# Patient Record
Sex: Male | Born: 1985
Health system: Southern US, Community
[De-identification: ages and names within clinical notes are randomized; demographics above are authoritative.]

## PROBLEM LIST (undated history)

## (undated) DIAGNOSIS — M1990 Unspecified osteoarthritis, unspecified site: Secondary | ICD-10-CM

## (undated) DIAGNOSIS — F419 Anxiety disorder, unspecified: Secondary | ICD-10-CM

## (undated) DIAGNOSIS — T7840XA Allergy, unspecified, initial encounter: Secondary | ICD-10-CM

## (undated) HISTORY — DX: Allergy, unspecified, initial encounter: T78.40XA

## (undated) HISTORY — DX: Unspecified osteoarthritis, unspecified site: M19.90

## (undated) HISTORY — PX: COLON SURGERY: SHX602

## (undated) HISTORY — DX: Anxiety disorder, unspecified: F41.9

## (undated) SURGICAL SUPPLY — 1 items: TISSUE ARTHROFLEX THICK 4 (Tissue) ×2 IMPLANT

---
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
## 2009-07-21 HISTORY — PX: CLAVICLE SURGERY: SHX598

## 2017-09-16 DIAGNOSIS — Z0000 Encounter for general adult medical examination without abnormal findings: Secondary | ICD-10-CM | POA: Diagnosis not present

## 2018-04-13 DIAGNOSIS — M79672 Pain in left foot: Secondary | ICD-10-CM | POA: Diagnosis not present

## 2018-04-13 DIAGNOSIS — M722 Plantar fascial fibromatosis: Secondary | ICD-10-CM | POA: Diagnosis not present

## 2019-03-25 ENCOUNTER — Ambulatory Visit: Payer: Self-pay

## 2019-03-25 NOTE — Telephone Encounter (Signed)
Pt called wanting to set appointment for new patient visit. He states that he has been experiencing SOB with activity for 2 weeks. He states that he runs and cycles regularly and has never experienced this before. He has recently moved to our area. He has never had asthma or allergies. He states that as he runs his breathing gets short and tight. "almost like asthma". He has no cough,sore throat,fever. He has worked from home and been careful to not socialize. He has a newborn. He denies SOB at rest. Only when active. Per protocol patient will go to UC for evaluation of his symptoms. Call was transferred to office for set up of appointment to establish care.  Reason for Disposition . [1] MODERATE longstanding difficulty breathing (e.g., speaks in phrases, SOB even at rest, pulse 100-120) AND [2] SAME as normal  Answer Assessment - Initial Assessment Questions 1. RESPIRATORY STATUS: "Describe your breathing?" (e.g., wheezing, shortness of breath, unable to speak, severe coughing)      SOB with activity 2. ONSET: "When did this breathing problem begin?"     2 weeks ago heavy chest 3. PATTERN "Does the difficult breathing come and go, or has it been constant since it started?"      Comes with physical activity 4. SEVERITY: "How bad is your breathing?" (e.g., mild, moderate, severe)    - MILD: No SOB at rest, mild SOB with walking, speaks normally in sentences, can lay down, no retractions, pulse < 100.    - MODERATE: SOB at rest, SOB with minimal exertion and prefers to sit, cannot lie down flat, speaks in phrases, mild retractions, audible wheezing, pulse 100-120.    - SEVERE: Very SOB at rest, speaks in single words, struggling to breathe, sitting hunched forward, retractions, pulse > 120     mild 5. RECURRENT SYMPTOM: "Have you had difficulty breathing before?" If so, ask: "When was the last time?" and "What happened that time?"      No some indications of seasonal allergies 6. CARDIAC HISTORY:  "Do you have any history of heart disease?" (e.g., heart attack, angina, bypass surgery, angioplasty)      no 7. LUNG HISTORY: "Do you have any history of lung disease?"  (e.g., pulmonary embolus, asthma, emphysema)     No 8. CAUSE: "What do you think is causing the breathing problem?"      Unsure possible allergies starting 9. OTHER SYMPTOMS: "Do you have any other symptoms? (e.g., dizziness, runny nose, cough, chest pain, fever)     no 10. PREGNANCY: "Is there any chance you are pregnant?" "When was your last menstrual period?"       N/A 11. TRAVEL: "Have you traveled out of the country in the last month?" (e.g., travel history, exposures)       no  Protocols used: BREATHING DIFFICULTY-A-AH

## 2019-04-06 ENCOUNTER — Encounter: Payer: Self-pay | Admitting: Family Medicine

## 2019-04-06 ENCOUNTER — Other Ambulatory Visit: Payer: Self-pay

## 2019-04-06 ENCOUNTER — Ambulatory Visit (INDEPENDENT_AMBULATORY_CARE_PROVIDER_SITE_OTHER): Payer: 59 | Admitting: Family Medicine

## 2019-04-06 VITALS — BP 110/66 | HR 72 | Temp 97.9°F | Resp 12 | Ht 68.0 in | Wt 175.4 lb

## 2019-04-06 DIAGNOSIS — J302 Other seasonal allergic rhinitis: Secondary | ICD-10-CM | POA: Diagnosis not present

## 2019-04-06 DIAGNOSIS — Z23 Encounter for immunization: Secondary | ICD-10-CM

## 2019-04-06 DIAGNOSIS — R0600 Dyspnea, unspecified: Secondary | ICD-10-CM | POA: Diagnosis not present

## 2019-04-06 MED ORDER — MONTELUKAST SODIUM 10 MG PO TABS: 10.0000 mg | ORAL_TABLET | Freq: Every day | ORAL | 3 refills | Status: DC

## 2019-04-06 MED ORDER — FLUTICASONE PROPIONATE 50 MCG/ACT NA SUSP: 2.0000 | Freq: Every day | NASAL | 4 refills | Status: DC

## 2019-04-06 NOTE — Progress Notes (Signed)
HPI:   Kent Johnson is a 33 y.o. male, who is here today to establish care.  Former PCP: Development worker, [facility] [place] Last preventive routine visit: 03/2017.  Chronic medical problems: Otherwise healthy.  He moved from [place] to this area 2-3 years ago.  Concerns today:  Today he is c/o 3-4 months of "ramdom" SOB and chest discomfort, "not pain" but mild pressure that happens when he is exercising outdoors.  It feels ike "my throat is closing", no stridor,dysphagia,or odynophagia.  It does not happen all the time. He runs a few times per week, same intensity of exercise since 2016. Pain is bilateral,no radiated. Problem has been stable. There is no associated palpitations ,or diaphoresis.  No hx of asthma or tobacco use. Mentions that his brother has "bad allergies."  He has not noted associated cough or wheezing. + Nasal congestion and rhinorrhea. He has no symptom when he exercises indoors.  Negative for fever,chills,abnormal wt loss,or night sweats.   Review of Systems  Constitutional: Negative for activity change, appetite change and fatigue.  HENT: Negative for mouth sores, nosebleeds, sore throat and trouble swallowing.   Eyes: Negative for redness and visual disturbance.  Cardiovascular: Negative for leg swelling.  Gastrointestinal: Negative for abdominal pain, nausea and vomiting.  Endocrine: Negative for cold intolerance and heat intolerance.  Genitourinary: Negative for decreased urine volume and hematuria.  Musculoskeletal: Negative for gait problem and myalgias.  Skin: Negative for pallor and rash.  Neurological: Negative for dizziness, syncope, weakness and headaches.  Hematological: Negative for adenopathy. Does not bruise/bleed easily.  Psychiatric/Behavioral: Negative for confusion. The patient is nervous/anxious.   Rest see pertinent positives and negatives per HPI.   No current outpatient medications on file prior to visit.   No current  facility-administered medications on file prior to visit.     History reviewed. No pertinent past medical history. No Known Allergies  Family History  Problem Relation Age of Onset  . Cancer Mother   . Miscarriages / [place] Mother   . Asthma Brother   . Arthritis Maternal Grandmother   . Cancer Maternal Grandfather   . Heart attack Maternal Grandfather     Social History   Socioeconomic History  . Marital status: Unknown    Spouse name: Not on file  . Number of children: Not on file  . Years of education: Not on file  . Highest education level: Not on file  Occupational History  . Not on file  Social Needs  . Financial resource strain: Not hard at all  . Food insecurity    Worry: Never true    Inability: Never true  . Transportation needs    Medical: No    Non-medical: No  Tobacco Use  . Smoking status: Never Smoker  . Smokeless tobacco: Never Used  Substance and Sexual Activity  . Alcohol use: Never    Frequency: Never  . Drug use: Never  . Sexual activity: Yes    Partners: Female  Lifestyle  . Physical activity    Days per week: 3 days    Minutes per session: 30 min  . Stress: Not on file  Relationships  . Social Herbalist on phone: Not on file    Gets together: Not on file    Attends religious service: Not on file    Active member of club or organization: Not on file    Attends meetings of clubs or organizations: Not on file  Relationship status: Not on file  Other Topics Concern  . Not on file  Social History Narrative  . Not on file    Vitals:   04/06/19 0746  BP: 110/66  Pulse: 72  Resp: 12  Temp: 97.9 F (36.6 C)  SpO2: 99%    Body mass index is 26.67 kg/m.  Physical Exam  Nursing note reviewed. Constitutional: He is oriented to person, place, and time. He appears well-developed and well-nourished. No distress.  HENT:  Head: Normocephalic and atraumatic.  Nose: Septal deviation present.  Mouth/Throat: Uvula is  midline and mucous membranes are normal. No oropharyngeal exudate, posterior oropharyngeal edema or posterior oropharyngeal erythema.  Hypertrophic turbinates. On posterior pharynx mild cobblestone .like changes.  Eyes: Pupils are equal, round, and reactive to light. Conjunctivae are normal.  Neck: No tracheal deviation present. No thyromegaly present.  Cardiovascular: Normal rate and regular rhythm.  No murmur heard. Pulses:      Dorsalis pedis pulses are 2+ on the right side and 2+ on the left side.  Respiratory: Effort normal and breath sounds normal. No respiratory distress. He exhibits no tenderness.  GI: Soft. He exhibits no mass. There is no hepatomegaly. There is no abdominal tenderness.  Musculoskeletal:        General: No edema.  Lymphadenopathy:    He has no cervical adenopathy.  Neurological: He is alert and oriented to person, place, and time. He has normal strength. No cranial nerve deficit. Gait normal.  Skin: Skin is warm. No rash noted. No erythema.  Psychiatric: His mood appears anxious.  Well groomed, good eye contact.    ASSESSMENT AND PLAN:  Mr. Cristal DeerChristopher was seen today for establish care.  Diagnoses and all orders for this visit:  Dyspnea, unspecified type We discussed possible etiologies, explained that the likelihood of problem being cardia is low. It is not consistent and does not happen with exertion indoors. Perception of SOB can be related to nasal congestion/allergies. Bronchospasm also to be consider even with no wheezing or cough.  Instructed about warning signs.  -     CBC -     Basic metabolic panel -     TSH -     DG Chest 2 View; Future  Seasonal allergic rhinitis, unspecified trigger Singulair 10 mg and Flonase nasal spray started today. Nasal saline irrigations as needed may also help.  -     montelukast (SINGULAIR) 10 MG tablet; Take 1 tablet (10 mg total) by mouth at bedtime. -     fluticasone (FLONASE) 50 MCG/ACT nasal spray;  Place 2 sprays into both nostrils daily.  Need for immunization against influenza -     Flu Vaccine QUAD 36+ mos IM   Return in about 2 months (around 06/06/2019) for SOB,allergies..   Tyrell Brereton G. Swazil[place]Jordan, MD  [facility]. [place] office.

## 2019-04-06 NOTE — Patient Instructions (Signed)
A few things to remember from today's visit:   Dyspnea, unspecified type - Plan: CBC, Basic metabolic panel, TSH, DG Chest 2 View  Seasonal allergic rhinitis, unspecified trigger  Also try nasal saline irrigations and over-the-counter Zyrtec 10 mg daily.  For now no restrictions in regard to exercise.

## 2019-04-10 ENCOUNTER — Encounter: Payer: Self-pay | Admitting: Family Medicine

## 2019-06-08 ENCOUNTER — Other Ambulatory Visit: Payer: Self-pay

## 2019-06-08 ENCOUNTER — Encounter: Payer: Self-pay | Admitting: Family Medicine

## 2019-06-08 ENCOUNTER — Ambulatory Visit (INDEPENDENT_AMBULATORY_CARE_PROVIDER_SITE_OTHER): Payer: 59 | Admitting: Family Medicine

## 2019-06-08 VITALS — BP 120/64 | HR 64 | Temp 97.8°F | Resp 12 | Ht 68.0 in | Wt 168.0 lb

## 2019-06-08 DIAGNOSIS — Z0000 Encounter for general adult medical examination without abnormal findings: Secondary | ICD-10-CM

## 2019-06-08 DIAGNOSIS — Z130 Encounter for screening for diseases of the blood and blood-forming organs and certain disorders involving the immune mechanism: Secondary | ICD-10-CM

## 2019-06-08 DIAGNOSIS — J302 Other seasonal allergic rhinitis: Secondary | ICD-10-CM | POA: Diagnosis not present

## 2019-06-08 DIAGNOSIS — Z13220 Encounter for screening for lipoid disorders: Secondary | ICD-10-CM | POA: Diagnosis not present

## 2019-06-08 DIAGNOSIS — Z13228 Encounter for screening for other metabolic disorders: Secondary | ICD-10-CM | POA: Diagnosis not present

## 2019-06-08 DIAGNOSIS — Z1329 Encounter for screening for other suspected endocrine disorder: Secondary | ICD-10-CM | POA: Diagnosis not present

## 2019-06-08 LAB — BASIC METABOLIC PANEL
BUN: 14 mg/dL (ref 6–23)
CO2: 30 mEq/L (ref 19–32)
Calcium: 10.1 mg/dL (ref 8.4–10.5)
Chloride: 103 mEq/L (ref 96–112)
Creatinine, Ser: 1.21 mg/dL (ref 0.40–1.50)
GFR: 69.16 mL/min (ref >60.00)
Glucose, Bld: 88 mg/dL (ref 70–99)
Potassium: 4.7 mEq/L (ref 3.5–5.1)
Sodium: 142 mEq/L (ref 135–145)

## 2019-06-08 LAB — LIPID PANEL
Cholesterol: 162 mg/dL (ref 0–200)
HDL: 50.1 mg/dL (ref >39.00)
LDL Cholesterol: 94 mg/dL (ref 0–99)
NonHDL: 111.43
Total CHOL/HDL Ratio: 3
Triglycerides: 89 mg/dL (ref 0.0–149.0)
VLDL: 17.8 mg/dL (ref 0.0–40.0)

## 2019-06-08 NOTE — Progress Notes (Signed)
HPI:  Kent Johnson is a 33 y.o.male here today for his routine physical examination.  Last CPE: 03/2017. He lives with his wife and 2 sons (2.5 yo and 51 months old).  Regular exercise 3 or more times per week: Walking 1-2 times per week, 45-60 min. Following a healthy diet: Yes. He does not eat fast food and eats sweets just during holidays. He doe snot snack.  Chronic medical problems: Allergic rhinitis.  Hx of STD's: Negative.  Immunization History  Administered Date(s) Administered  . Influenza,inj,Quad PF,6+ Mos 04/06/2019   -Denies high alcohol intake, tobacco use, or Hx of illicit drug use.  -Concerns and/or follow up today: Singulair has helped with nasal congestion, he is not longer feeling SOB but concerned about possible side effects. He would like to try different medication. He is using Flonase nasal spray daily prn.  Review of Systems  Constitutional: Negative for activity change, appetite change, fatigue and fever.  HENT: Negative for dental problem, nosebleeds, sore throat and trouble swallowing.   Eyes: Negative for redness and visual disturbance.  Respiratory: Negative for cough, shortness of breath and wheezing.   Cardiovascular: Negative for chest pain, palpitations and leg swelling.  Gastrointestinal: Negative for abdominal pain, blood in stool, nausea and vomiting.  Endocrine: Negative for cold intolerance, heat intolerance, polydipsia, polyphagia and polyuria.  Genitourinary: Negative for decreased urine volume, discharge, dysuria, genital sores, hematuria and testicular pain.  Musculoskeletal: Negative for gait problem and myalgias.  Skin: Negative for color change and rash.  Allergic/Immunologic: Positive for environmental allergies.  Neurological: Negative for syncope, weakness and headaches.  Hematological: Negative for adenopathy. Does not bruise/bleed easily.  Psychiatric/Behavioral: Negative for confusion. The patient is not  nervous/anxious.   All other systems reviewed and are negative.  Current Outpatient Medications on File Prior to Visit  Medication Sig Dispense Refill  . fluticasone (FLONASE) 50 MCG/ACT nasal spray Place 2 sprays into both nostrils daily. 16 g 4  . montelukast (SINGULAIR) 10 MG tablet Take 1 tablet (10 mg total) by mouth at bedtime. 30 tablet 3   No current facility-administered medications on file prior to visit.    No past medical history on file. Past Surgical History:  Procedure Laterality Date  . CLAVICLE SURGERY  2011   broke collar bone skiing  . LIPOMA EXCISION  2007   benign lipoma in colon, caused intersepcion   No Known Allergies  Family History  Problem Relation Age of Onset  . Cancer Mother   . Miscarriages / [place] Mother   . Asthma Brother   . Arthritis Maternal Grandmother   . Cancer Maternal Grandfather   . Heart attack Maternal Grandfather    Social History   Socioeconomic History  . Marital status: Married    Spouse name: Not on file  . Number of children: Not on file  . Years of education: Not on file  . Highest education level: Not on file  Occupational History  . Not on file  Social Needs  . Financial resource strain: Not hard at all  . Food insecurity    Worry: Never true    Inability: Never true  . Transportation needs    Medical: No    Non-medical: No  Tobacco Use  . Smoking status: Never Smoker  . Smokeless tobacco: Never Used  Substance and Sexual Activity  . Alcohol use: Never    Frequency: Never  . Drug use: Never  . Sexual activity: Yes    Partners: Female  Lifestyle  . Physical activity    Days per week: 3 days    Minutes per session: 30 min  . Stress: Not on file  Relationships  . Social Herbalist on phone: Not on file    Gets together: Not on file    Attends religious service: Not on file    Active member of club or organization: Not on file    Attends meetings of clubs or organizations: Not on file     Relationship status: Not on file  Other Topics Concern  . Not on file  Social History Narrative  . Not on file    Today's Vitals   06/08/19 0703  BP: 120/64  Pulse: 64  Resp: 12  Temp: 97.8 F (36.6 C)  TempSrc: Temporal  SpO2: 99%  Weight: 168 lb (76.2 kg)  Height: 5\' 8"  (1.727 m)   Body mass index is 25.54 kg/m.  Wt Readings from Last 3 Encounters:  06/08/19 168 lb (76.2 kg)  04/06/19 175 lb 6 oz (79.5 kg)    Physical Exam  Nursing note and vitals reviewed. Constitutional: He is oriented to person, place, and time. He appears well-developed and well-nourished. No distress.  HENT:  Head: Normocephalic and atraumatic.  Right Ear: Tympanic membrane, external ear and ear canal normal.  Left Ear: Tympanic membrane, external ear and ear canal normal.  Mouth/Throat: Oropharynx is clear and moist and mucous membranes are normal.  Eyes: Pupils are equal, round, and reactive to light. Conjunctivae and EOM are normal.  Neck: Normal range of motion. No tracheal deviation present. No thyromegaly present.  Cardiovascular: Normal rate and regular rhythm.  No murmur heard. Pulses:      Dorsalis pedis pulses are 2+ on the right side and 2+ on the left side.  Respiratory: Effort normal and breath sounds normal. No respiratory distress.  GI: Soft. He exhibits no mass. There is no hepatomegaly. There is no abdominal tenderness.  Genitourinary:    Genitourinary Comments: Refused,no concerns.   Musculoskeletal:        General: No tenderness or edema.     Comments: No major deformities appreciated and no signs of synovitis.  Lymphadenopathy:    He has no cervical adenopathy.       Right: No supraclavicular adenopathy present.       Left: No supraclavicular adenopathy present.  Neurological: He is alert and oriented to person, place, and time. He has normal strength. No cranial nerve deficit or sensory deficit. Coordination and gait normal.  Reflex Scores:      Bicep reflexes are  2+ on the right side and 2+ on the left side.      Patellar reflexes are 2+ on the right side and 2+ on the left side. Skin: Skin is warm. No erythema.  Psychiatric: He has a normal mood and affect. Cognition and memory are normal.  Well groomed,good eye contact.   ASSESSMENT AND PLAN:  Mr. Nagee Goates was here today annual physical examination.  Diagnoses and all orders for this visit:  Lab Results  Component Value Date   CHOL 162 06/08/2019   HDL 50.10 06/08/2019   LDLCALC 94 06/08/2019   TRIG 89.0 06/08/2019   CHOLHDL 3 06/08/2019   Lab Results  Component Value Date   CREATININE 1.21 06/08/2019   BUN 14 06/08/2019   NA 142 06/08/2019   K 4.7 06/08/2019   CL 103 06/08/2019   CO2 30 06/08/2019    Routine general medical  examination at a health care facility We discussed the importance of regular physical activity and healthy diet for prevention of chronic illness and/or complications. Preventive guidelines reviewed. Vaccination up to date.  Next CPE in a year.  Screening for lipoid disorders -     Lipid panel  Screening for endocrine, metabolic and immunity disorder -     Basic metabolic panel  Seasonal allergic rhinitis, unspecified trigger Recommend trying Cetirizine 10 mg daily and to continue Flonase nasal spray. He can take Singulair during spring and Fall if needed.   Return in 1 year (on 06/07/2020).     G. [place], MD  [facility]. [place] office.

## 2019-06-08 NOTE — Patient Instructions (Signed)
A few things to remember from today's visit:   Routine general medical examination at a health care facility  Screening for lipoid disorders - Plan: Lipid panel  Screening for endocrine, metabolic and immunity disorder - Plan: Basic metabolic panel   At least 150 minutes of moderate exercise per week, daily brisk walking for 15-30 min is a good exercise option. Healthy diet low in saturated (animal) fats and sweets and consisting of fresh fruits and vegetables, lean meats such as fish and white chicken and whole grains.  - Vaccines:  Tdap vaccine every 10 years.  Shingles vaccine recommended at age 67, could be given after 32 years of age but not sure about insurance coverage.  Pneumonia vaccines:  Pneumovax at 20.   -Screening recommendations for low/normal risk males:  Screening for diabetes at age 9 and every 3 years. Earlier screening if cardiovascular risk factors.   Lipid screening at 35 and every 3 years. Screening starts in younger males with cardiovascular risk factors.  Colon cancer screening at age 24 and until age 37.  Prostate cancer screening: some controversy, starts usually at 25: Rectal exam and PSA.  Aortic Abdominal Aneurism once between 85 and 30 years old if ever smoker.  Also recommended:  1. Dental visit- Brush and floss your teeth twice daily; visit your dentist twice a year. 2. Eye doctor- Get an eye exam at least every 2 years. 3. Helmet use- Always wear a helmet when riding a bicycle, motorcycle, rollerblading or skateboarding. 4. Safe sex- If you may be exposed to sexually transmitted infections, use a condom. 5. Seat belts- Seat belts can save your live; always wear one. 6. Smoke/Carbon Monoxide detectors- These detectors need to be installed on the appropriate level of your home. Replace batteries at least once a year. 7. Skin cancer- When out in the sun please cover up and use sunscreen 15 SPF or higher. 8. Violence- If anyone is threatening  or hurting you, please tell your healthcare provider.  9. Drink alcohol in moderation- Limit alcohol intake to one drink or less per day. Never drink and drive.  Please be sure medication list is accurate. If a new problem present, please set up appointment sooner than planned today.

## 2019-09-22 ENCOUNTER — Encounter: Payer: Self-pay | Admitting: Family Medicine

## 2019-09-22 DIAGNOSIS — Z0189 Encounter for other specified special examinations: Secondary | ICD-10-CM

## 2019-09-22 DIAGNOSIS — J069 Acute upper respiratory infection, unspecified: Secondary | ICD-10-CM

## 2019-09-23 ENCOUNTER — Other Ambulatory Visit: Payer: Self-pay

## 2019-09-26 ENCOUNTER — Other Ambulatory Visit: Payer: Self-pay

## 2019-09-26 ENCOUNTER — Other Ambulatory Visit (INDEPENDENT_AMBULATORY_CARE_PROVIDER_SITE_OTHER): Payer: Managed Care, Other (non HMO)

## 2019-09-26 DIAGNOSIS — J069 Acute upper respiratory infection, unspecified: Secondary | ICD-10-CM | POA: Diagnosis not present

## 2019-09-26 DIAGNOSIS — Z0189 Encounter for other specified special examinations: Secondary | ICD-10-CM

## 2019-09-27 LAB — SARS COV-2 SEROLOGY(COVID-19)AB(IGG,IGM),IMMUNOASSAY
SARS CoV-2 AB IgG: NEGATIVE
SARS CoV-2 IgM: NEGATIVE

## 2019-12-15 ENCOUNTER — Ambulatory Visit (INDEPENDENT_AMBULATORY_CARE_PROVIDER_SITE_OTHER): Payer: Managed Care, Other (non HMO) | Admitting: Orthopaedic Surgery

## 2019-12-15 ENCOUNTER — Other Ambulatory Visit: Payer: Self-pay

## 2019-12-15 ENCOUNTER — Ambulatory Visit (INDEPENDENT_AMBULATORY_CARE_PROVIDER_SITE_OTHER): Payer: Managed Care, Other (non HMO)

## 2019-12-15 DIAGNOSIS — M1711 Unilateral primary osteoarthritis, right knee: Secondary | ICD-10-CM

## 2019-12-15 DIAGNOSIS — M25561 Pain in right knee: Secondary | ICD-10-CM

## 2019-12-15 DIAGNOSIS — G8929 Other chronic pain: Secondary | ICD-10-CM | POA: Diagnosis not present

## 2019-12-15 NOTE — Progress Notes (Signed)
Office Visit Note   Patient: Kent Johnson           Date of Birth: Aug 24, 1985           MRN: [id number] Visit Date: 12/15/2019              Requested by: [name], [name] G, MD [address] [place],  [place] [address] PCP: [name], [name] G, MD   Assessment & Plan: Visit Diagnoses:  1. Chronic pain of right knee     Plan: Based on his mechanical symptoms of his right knee and my exam I am recommending a MRI to rule out a meniscal tear or even a collateral ligament injury.  He will still refrain from any contact sports until we see him back in this MRI.  All questions and concerns were answered and addressed.  Follow-Up Instructions: Return in about 2 weeks (around 12/29/2019).   Orders:  Orders Placed This Encounter  Procedures  . XR Knee 1-2 Views Right   No orders of the defined types were placed in this encounter.     Procedures: No procedures performed   Clinical Data: No additional findings.   Subjective: Chief Complaint  Patient presents with  . Right Knee - Pain  The patient is a very pleasant 34 year old gentleman who comes in today with a several month history of worsening right knee pain with mechanical symptoms.  On Christmas day his child crashed into the medial aspect of his right knee and subluxed laterally.  He has had pain along the lateral joint line since then with some locking catching.  He stayed off from contact sports and running for 6 weeks.  He is very athletic.  He still has no commonness in that knee due to pain he is having.  It feels unstable and does swell as well.  Feels tight in the back of his knee.  HPI  Review of Systems He currently denies any headache, chest pain, shortness of breath, fever, chills, nausea, vomiting  Objective: Vital Signs: There were no vitals taken for this visit.  Physical Exam He is alert and orient x3 and in no acute distress Ortho Exam Examination of his right knee shows pain past 9  degrees of flexion.  He worse along the medial joint line and has an positive Murray sign to the medial and lateral compartments of his knee.  I feel that there is a touch of instability with varus valgus stretching.  His Lachman's is negative. Specialty Comments:  No specialty comments available.  Imaging: XR Knee 1-2 Views Right  Result Date: 12/15/2019 2 views of the right knee show no acute findings.  There is alignment is well-maintained.  There is no effusion.    PMFS History: There are no problems to display for this patient.  No past medical history on file.  Family History  Problem Relation Age of Onset  . Cancer Mother   . Miscarriages / [place] Mother   . Asthma Brother   . Arthritis Maternal Grandmother   . Cancer Maternal Grandfather   . Heart attack Maternal Grandfather     Past Surgical History:  Procedure Laterality Date  . CLAVICLE SURGERY  2011   broke collar bone skiing  . LIPOMA EXCISION  2007   benign lipoma in colon, caused intersepcion   Social History   Occupational History  . Not on file  Tobacco Use  . Smoking status: Never Smoker  . Smokeless tobacco: Never Used  Substance and  Sexual Activity  . Alcohol use: Never  . Drug use: Never  . Sexual activity: Yes    Partners: Female

## 2019-12-26 ENCOUNTER — Telehealth: Payer: Self-pay | Admitting: Orthopaedic Surgery

## 2019-12-26 NOTE — Telephone Encounter (Signed)
Patient called.  He is having his MRI done on the 11th and wanted to see if he could get in for his MRI review sooner than the next available.   Call back: [phone]

## 2019-12-26 NOTE — Telephone Encounter (Signed)
Yes, he can see Kent Johnson on the 14th if he would like  But there is nothing for [employer]

## 2019-12-26 NOTE — Telephone Encounter (Signed)
Called patient left message to return call to schedule an appointment after 12/30/2019 for MRI review

## 2019-12-29 ENCOUNTER — Ambulatory Visit: Payer: Managed Care, Other (non HMO) | Admitting: Physician Assistant

## 2019-12-30 ENCOUNTER — Other Ambulatory Visit: Payer: Self-pay

## 2019-12-30 ENCOUNTER — Ambulatory Visit
Admission: RE | Admit: 2019-12-30 | Discharge: 2019-12-30 | Disposition: A | Discharge status: Home / Self Care | Payer: Managed Care, Other (non HMO) | Source: Ambulatory Visit | Attending: Orthopaedic Surgery | Admitting: Orthopaedic Surgery

## 2019-12-30 DIAGNOSIS — M25561 Pain in right knee: Secondary | ICD-10-CM

## 2019-12-30 DIAGNOSIS — M1711 Unilateral primary osteoarthritis, right knee: Secondary | ICD-10-CM

## 2019-12-30 IMAGING — MR MR KNEE*R* W/O CM
5 of 7 series · 22 of 40 positions shown · non-contrast
Comparison: X-ray [DATE]

CLINICAL DATA: Medial right knee pain after injury 2 months ago,
swelling, weakness

EXAM:
MRI OF THE RIGHT KNEE WITHOUT CONTRAST
TECHNIQUE: Multiplanar, multisequence MR imaging of the knee was performed. No
intravenous contrast was administered.

[Series 3: T2 fat-sat · axial · 4.0mm · 0.56mm/px · z∈[-85,+105]mm · 5 of 39 slices shown (1 of 2)]
[im 1/39]
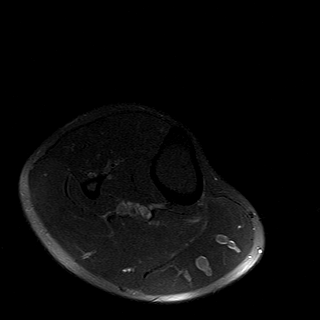
[im 10/39]
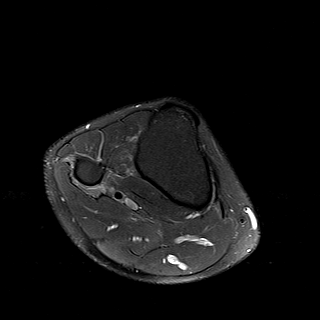
[im 20/39]
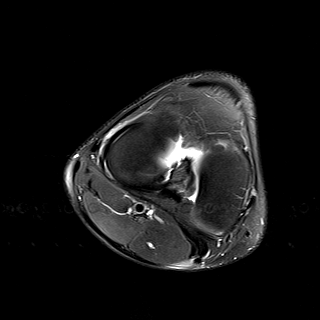
[im 29/39]
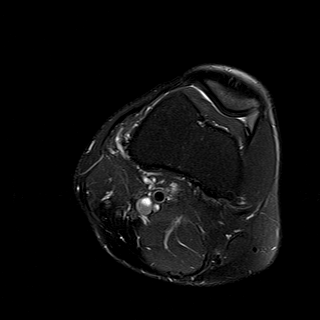
[im 39/39]
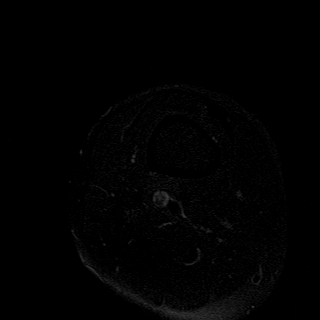

[Series 5: T2 fat-sat · coronal · 4.0mm · 0.33mm/px · 1 of 35 slices shown (2 of 2)]
[im 1/35]
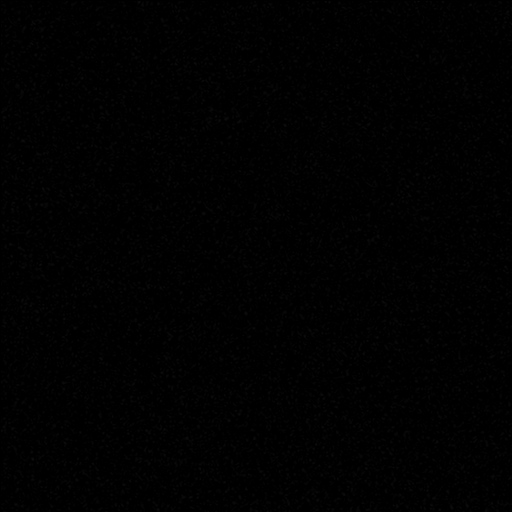

[Series 7: PD fat-sat · sagittal · 3.0mm · 0.33mm/px · 7 of 45 slices shown (1 of 3)]
[im 1/45]
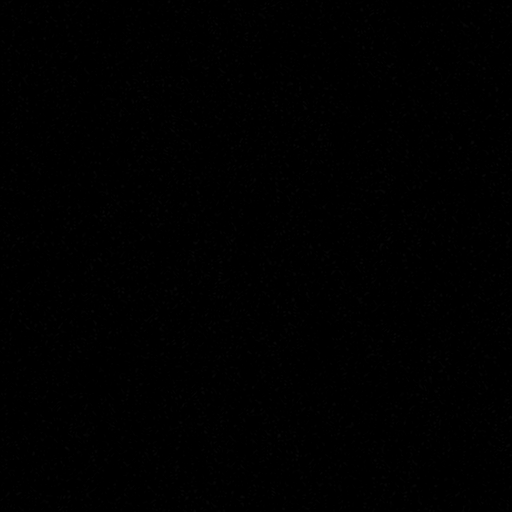
[im 8/45]
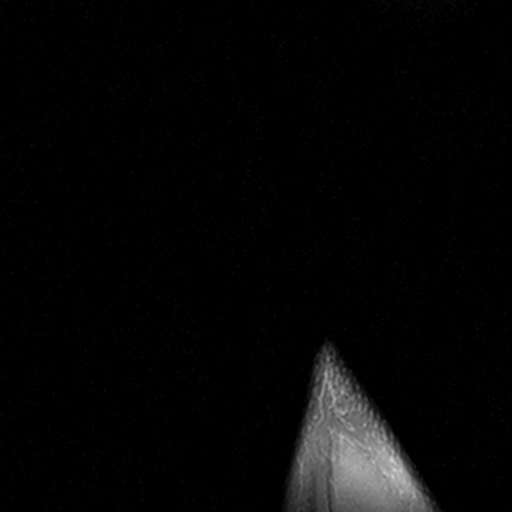
[im 15/45]
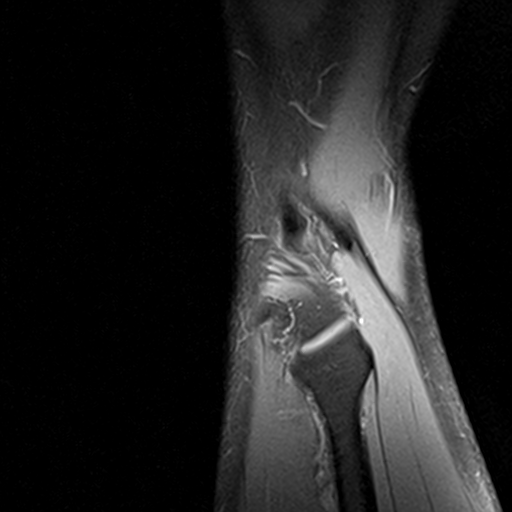
[im 23/45]
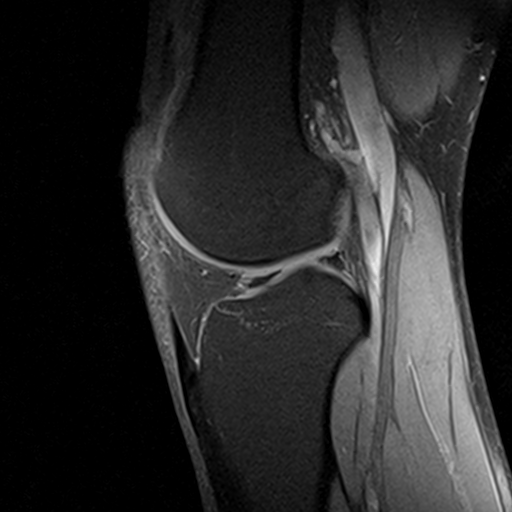
[im 30/45]
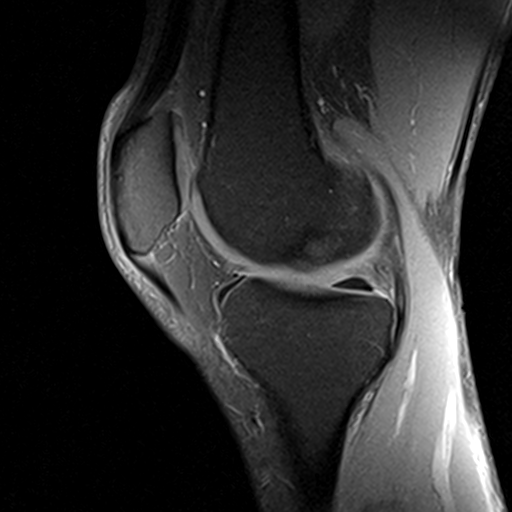
[im 37/45]
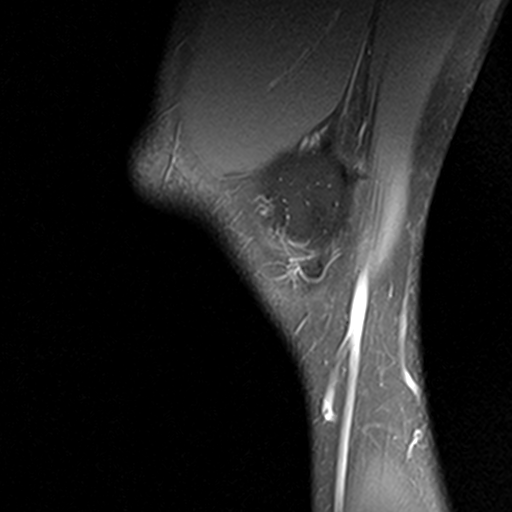
[im 45/45]
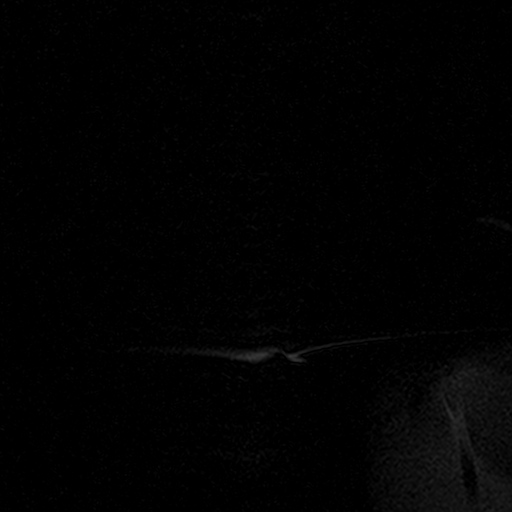

[Series 8: PD fat-sat · coronal · 4.0mm · 0.33mm/px · 6 of 35 slices shown (2 of 3)]
[im 1/35]
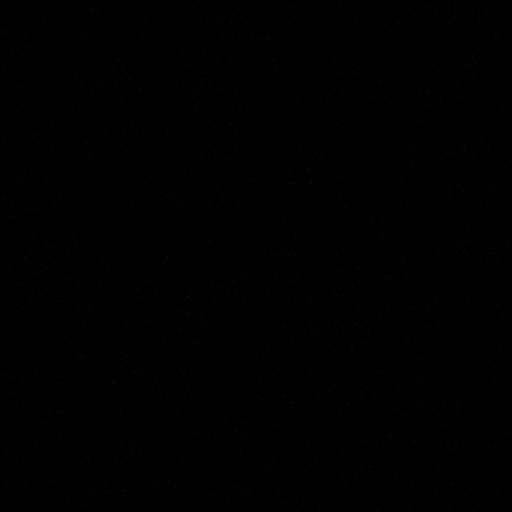
[im 7/35]
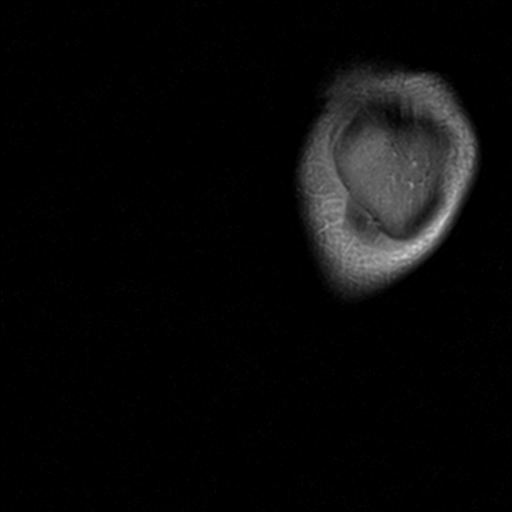
[im 14/35]
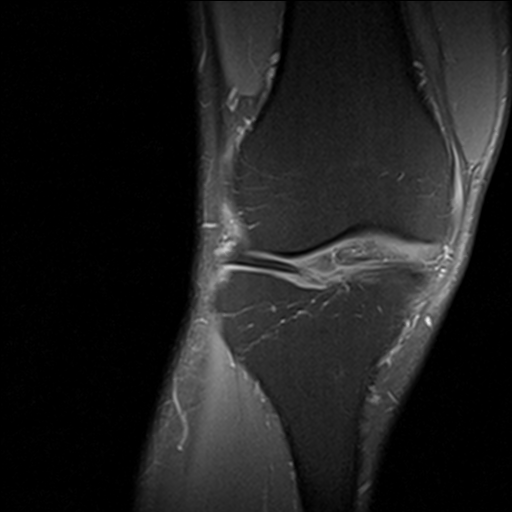
[im 21/35]
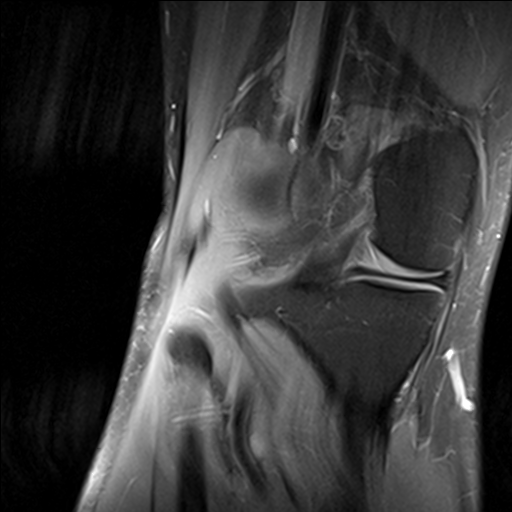
[im 28/35]
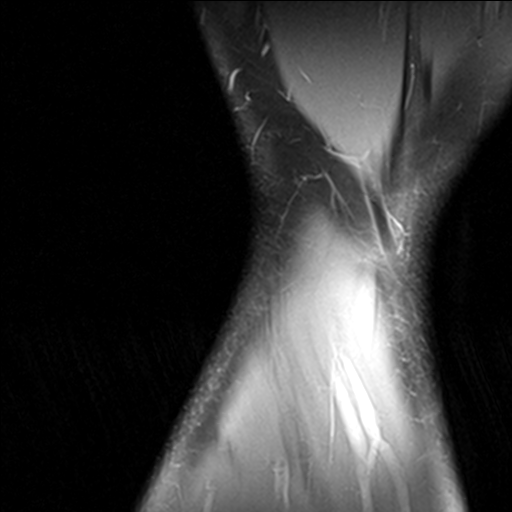
[im 35/35]
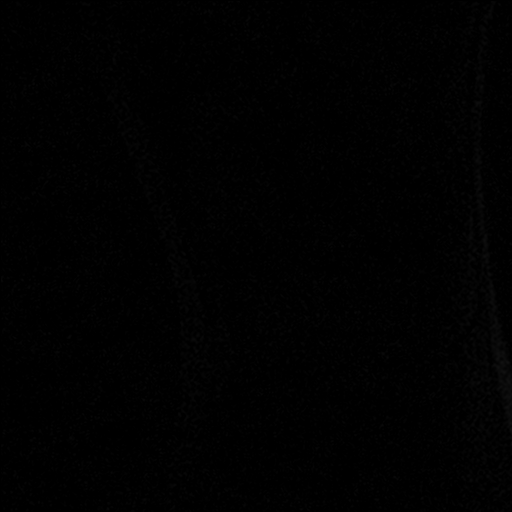

[Series 9: PD fat-sat · coronal · 2.3mm · 0.29mm/px · 3 of 19 slices shown (3 of 3)]
[im 1/19]
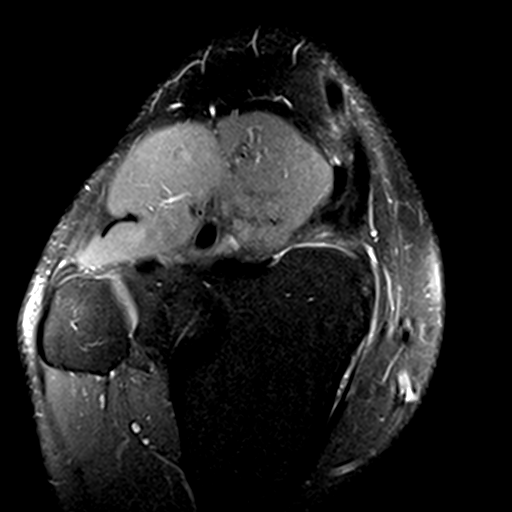
[im 10/19]
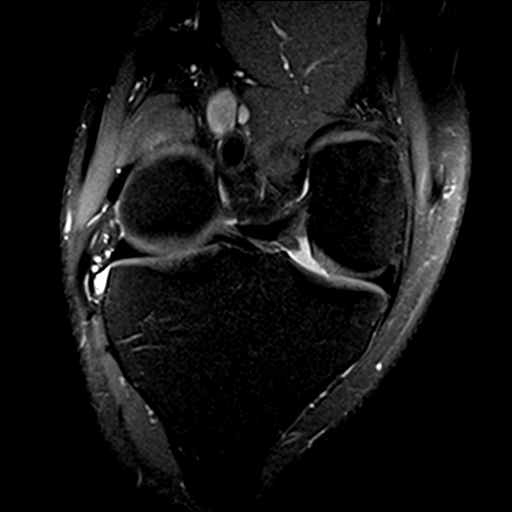
[im 19/19]
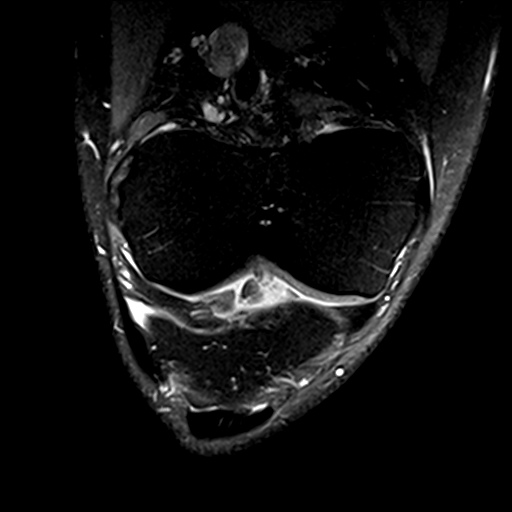

[22 of 40 positions shown; findings below may reference images not displayed]

FINDINGS: MENISCI

Medial meniscus:  Intact.

Lateral meniscus:  Intact.

LIGAMENTS

Cruciates:  Intact ACL and PCL.

Collaterals: Medial collateral ligament is intact. Lateral
collateral ligament complex is intact.

CARTILAGE

Patellofemoral:  No chondral defect.

Medial: Focal 4 mm partial-thickness cartilage defect along the
lateral portion of the weight-bearing medial femoral condyle (series
8, images 16-17). Medial tibial plateau cartilage intact.

Lateral:  No chondral defect.

Joint:  No joint effusion.  Unremarkable fat pads.

Popliteal Fossa:  No Baker cyst. Intact popliteus tendon.

Extensor Mechanism:  Intact quadriceps tendon and patellar tendon.

Bones: No focal marrow signal abnormality. No fracture or
dislocation.

Other: None.
IMPRESSION: 1. Focal 4 mm partial-thickness cartilage defect along the lateral
portion of the weight-bearing medial femoral condyle.
2. Intact menisci and ligamentous structures.

## 2020-01-18 ENCOUNTER — Ambulatory Visit: Payer: Managed Care, Other (non HMO) | Admitting: Orthopaedic Surgery

## 2020-01-18 ENCOUNTER — Telehealth: Payer: Self-pay | Admitting: Orthopaedic Surgery

## 2020-01-18 NOTE — Telephone Encounter (Signed)
E 

## 2020-01-25 ENCOUNTER — Ambulatory Visit (INDEPENDENT_AMBULATORY_CARE_PROVIDER_SITE_OTHER): Payer: Managed Care, Other (non HMO) | Admitting: Orthopaedic Surgery

## 2020-01-25 ENCOUNTER — Encounter: Payer: Self-pay | Admitting: Orthopaedic Surgery

## 2020-01-25 ENCOUNTER — Other Ambulatory Visit: Payer: Self-pay

## 2020-01-25 DIAGNOSIS — G8929 Other chronic pain: Secondary | ICD-10-CM

## 2020-01-25 DIAGNOSIS — M25561 Pain in right knee: Secondary | ICD-10-CM | POA: Diagnosis not present

## 2020-01-25 MED ORDER — LIDOCAINE HCL 1 % IJ SOLN
3.0000 mL | INTRAMUSCULAR | Status: AC | PRN
  Administered 2020-01-25: 3 mL

## 2020-01-25 MED ORDER — METHYLPREDNISOLONE ACETATE 40 MG/ML IJ SUSP
40.0000 mg | INTRAMUSCULAR | Status: AC | PRN
  Administered 2020-01-25: 40 mg via INTRA_ARTICULAR

## 2020-01-25 NOTE — Progress Notes (Signed)
Office Visit Note   Patient: Kent Johnson           Date of Birth: 08-03-1985           MRN: [id number] Visit Date: 01/25/2020              Requested by: [name], [name] G, MD [address] [place],  [place] [address] PCP: [name], [name] G, MD   Assessment & Plan: Visit Diagnoses:  1. Chronic pain of right knee     Plan:  Discussed with him knee friendly exercises.  Recommend cortisone injection as he has had no conservative treatment for the knee.  He agrees with cortisone injection today.  We will see him back on as-needed basis.  And discussed with him next at cortisone injection failed will be a supplemental injection.  He understands that he needs to wait at least 3 months between cortisone injections.  Questions encouraged and answered  Follow-Up Instructions: Return in about 2 weeks (around 02/08/2020).   Orders:  Orders Placed This Encounter  Procedures  . Large Joint Inj   No orders of the defined types were placed in this encounter.     Procedures: Large Joint Inj: R knee on 01/25/2020 8:55 AM Indications: pain Details: 22 G 1.5 in needle, anterolateral approach  Arthrogram: No  Medications: 3 mL lidocaine 1 %; 40 mg methylPREDNISolone acetate 40 MG/ML Outcome: tolerated well, no immediate complications Procedure, treatment alternatives, risks and benefits explained, specific risks discussed. Consent was given by the patient. Immediately prior to procedure a time out was called to verify the correct patient, procedure, equipment, support staff and site/side marked as required. Patient was prepped and draped in the usual sterile fashion.       Clinical Data: No additional findings.   Subjective: Chief Complaint  Patient presents with  . Right Knee - Follow-up    HPI Kent Johnson returns today to go over the MRI of his right knee.  Continues to have some pain with discomfort deep within the knee with a knee exercise activity.  He has begun  swimming for exercise and biking as he was a very active runner prior to this most recent injury which occurred back in December. MRI right knee is reviewed with the patient.  Images reviewed.  No ligamentous or meniscal injury.  4 mm partial thickness cartilage loss defect along the lateral portion of the medial femoral condyle weightbearing portion seen.  Otherwise normal MRI.  Review of Systems  Constitutional: Negative for chills and fever.  Musculoskeletal: Positive for arthralgias.     Objective: Vital Signs: There were no vitals taken for this visit.  Physical Exam General: Well-developed well-nourished male no acute distress. Psych: Alert and oriented x3 Ortho Exam Right knee good range of motion without pain.  No tenderness palpation along medial lateral joint line.  No abnormal warmth erythema. Specialty Comments:  No specialty comments available.  Imaging: No results found.   PMFS History: There are no problems to display for this patient.  History reviewed. No pertinent past medical history.  Family History  Problem Relation Age of Onset  . Cancer Mother   . Miscarriages / [place] Mother   . Asthma Brother   . Arthritis Maternal Grandmother   . Cancer Maternal Grandfather   . Heart attack Maternal Grandfather     Past Surgical History:  Procedure Laterality Date  . CLAVICLE SURGERY  2011   broke collar bone skiing  . LIPOMA EXCISION  2007  benign lipoma in colon, caused intersepcion   Social History   Occupational History  . Not on file  Tobacco Use  . Smoking status: Never Smoker  . Smokeless tobacco: Never Used  Vaping Use  . Vaping Use: Never assessed  Substance and Sexual Activity  . Alcohol use: Never  . Drug use: Never  . Sexual activity: Yes    Partners: Female

## 2020-02-01 ENCOUNTER — Ambulatory Visit: Payer: Managed Care, Other (non HMO) | Admitting: Orthopaedic Surgery

## 2020-11-06 ENCOUNTER — Other Ambulatory Visit: Payer: Self-pay

## 2020-11-06 ENCOUNTER — Ambulatory Visit (INDEPENDENT_AMBULATORY_CARE_PROVIDER_SITE_OTHER): Payer: Managed Care, Other (non HMO) | Admitting: Family Medicine

## 2020-11-06 ENCOUNTER — Encounter: Payer: Self-pay | Admitting: Family Medicine

## 2020-11-06 VITALS — BP 128/70 | HR 61 | Temp 98.2°F | Resp 12 | Ht 68.0 in | Wt 180.0 lb

## 2020-11-06 DIAGNOSIS — Z130 Encounter for screening for diseases of the blood and blood-forming organs and certain disorders involving the immune mechanism: Secondary | ICD-10-CM

## 2020-11-06 DIAGNOSIS — Z13220 Encounter for screening for lipoid disorders: Secondary | ICD-10-CM | POA: Diagnosis not present

## 2020-11-06 DIAGNOSIS — Z1329 Encounter for screening for other suspected endocrine disorder: Secondary | ICD-10-CM

## 2020-11-06 DIAGNOSIS — Z13228 Encounter for screening for other metabolic disorders: Secondary | ICD-10-CM

## 2020-11-06 DIAGNOSIS — Z0000 Encounter for general adult medical examination without abnormal findings: Secondary | ICD-10-CM

## 2020-11-06 DIAGNOSIS — Z1159 Encounter for screening for other viral diseases: Secondary | ICD-10-CM | POA: Diagnosis not present

## 2020-11-06 LAB — BASIC METABOLIC PANEL
BUN: 15 mg/dL (ref 6–23)
CO2: 32 mEq/L (ref 19–32)
Calcium: 10 mg/dL (ref 8.4–10.5)
Chloride: 100 mEq/L (ref 96–112)
Creatinine, Ser: 1.17 mg/dL (ref 0.40–1.50)
GFR: 81.51 mL/min (ref >60.00)
Glucose, Bld: 79 mg/dL (ref 70–99)
Potassium: 4.2 mEq/L (ref 3.5–5.1)
Sodium: 139 mEq/L (ref 135–145)

## 2020-11-06 LAB — LIPID PANEL
Cholesterol: 170 mg/dL (ref 0–200)
HDL: 54.5 mg/dL (ref >39.00)
LDL Cholesterol: 97 mg/dL (ref 0–99)
NonHDL: 115.06
Total CHOL/HDL Ratio: 3
Triglycerides: 91 mg/dL (ref 0.0–149.0)
VLDL: 18.2 mg/dL (ref 0.0–40.0)

## 2020-11-06 NOTE — Patient Instructions (Addendum)
A few things to remember from today's visit:  No diagnosis found.  Please be sure medication list is accurate. If a new problem present, please set up appointment sooner than planned today.  At least 150 minutes of moderate exercise per week, daily brisk walking for 15-30 min is a good exercise option. Healthy diet low in saturated (animal) fats and sweets and consisting of fresh fruits and vegetables, lean meats such as fish and white chicken and whole grains.  - Vaccines:  Tdap vaccine every 10 years.  Shingles vaccine recommended at age 72, could be given after 34 years of age but not sure about insurance coverage.  Pneumonia vaccines: Pneumovax at 11  -Screening recommendations for low/normal risk males:  Screening for diabetes at age 75 and every 3 years. Earlier screening if cardiovascular risk factors.   Lipid screening at 35 and every 3 years. Screening starts in younger males with cardiovascular risk factors.  Colon cancer screening is now at age 73 but your insurance may not cover until age 36 .screening is recommended age 86.  Prostate cancer screening: some controversy, starts usually at 50: Rectal exam and PSA.  Aortic Abdominal Aneurism once between 65 and 72 years old if ever smoker.  Also recommended:  1. Dental visit- Brush and floss your teeth twice daily; visit your dentist twice a year. 2. Eye doctor- Get an eye exam at least every 2 years. 3. Helmet use- Always wear a helmet when riding a bicycle, motorcycle, rollerblading or skateboarding. 4. Safe sex- If you may be exposed to sexually transmitted infections, use a condom. 5. Seat belts- Seat belts can save your live; always wear one. 6. Smoke/Carbon Monoxide detectors- These detectors need to be installed on the appropriate level of your home. Replace batteries at least once a year. 7. Skin cancer- When out in the sun please cover up and use sunscreen 15 SPF or higher. 8. Violence- If anyone is  threatening or hurting you, please tell your healthcare provider.  9. Drink alcohol in moderation- Limit alcohol intake to one drink or less per day. Never drink and drive.

## 2020-11-06 NOTE — Progress Notes (Signed)
HPI:  [name] is a 35 y.o.male here today for his routine physical examination.  Last CPE: 06/08/19 He lives with his wife and 2 children.  Regular exercise 3 or more times per week: Swimming 1-2 times per week for 30-45 min. Following a healthy diet: He tries to avoid fast food but for the most part cooking at home.  Chronic medical problems: Allergic rhinitis and dx'ed with knee OA. Knee pain greatly improved after quitting running. Allergic rhinitis: Using OTC intranasal spray, he is not longer on Singulair. Symptoms are well controlled.  Immunization History  Administered Date(s) Administered  . Influenza,inj,Quad PF,6+ Mos 04/06/2019  . Tdap 07/22/2015   Hep C screening: Never Last colon cancer screening: N/A Last prostate ca screening: N/A  Negative for high alcohol intake or tobacco use.Marland Kitchen  He has no concerns today.  Review of Systems  Constitutional: Negative for activity change, appetite change, fatigue and fever.  HENT: Negative for nosebleeds, sore throat, trouble swallowing and voice change.   Eyes: Negative for redness and visual disturbance.  Respiratory: Negative for cough, shortness of breath and wheezing.   Cardiovascular: Negative for chest pain, palpitations and leg swelling.  Gastrointestinal: Negative for abdominal pain, blood in stool, nausea and vomiting.  Endocrine: Negative for cold intolerance, heat intolerance, polydipsia, polyphagia and polyuria.  Genitourinary: Negative for decreased urine volume, dysuria, genital sores, hematuria and testicular pain.  Musculoskeletal: Negative for gait problem and myalgias.  Skin: Negative for color change and rash.  Allergic/Immunologic: Positive for environmental allergies.  Neurological: Negative for syncope, weakness and headaches.  Hematological: Negative for adenopathy. Does not bruise/bleed easily.  Psychiatric/Behavioral: Negative for confusion and sleep disturbance. The patient  is not nervous/anxious.    Current Outpatient Medications on File Prior to Visit  Medication Sig Dispense Refill  . fluticasone (FLONASE) 50 MCG/ACT nasal spray Place 2 sprays into both nostrils daily. 16 g 4   No current facility-administered medications on file prior to visit.   History reviewed. No pertinent past medical history.  Past Surgical History:  Procedure Laterality Date  . CLAVICLE SURGERY  2011   broke collar bone skiing  . LIPOMA EXCISION  2007   benign lipoma in colon, caused intersepcion   No Known Allergies  Family History  Problem Relation Age of Onset  . Cancer Mother   . Miscarriages / [place] Mother   . Asthma Brother   . Arthritis Maternal Grandmother   . Cancer Maternal Grandfather   . Heart attack Maternal Grandfather     Social History   Socioeconomic History  . Marital status: Married    Spouse name: Not on file  . Number of children: Not on file  . Years of education: Not on file  . Highest education level: Not on file  Occupational History  . Not on file  Tobacco Use  . Smoking status: Never Smoker  . Smokeless tobacco: Never Used  Vaping Use  . Vaping Use: Not on file  Substance and Sexual Activity  . Alcohol use: Never  . Drug use: Never  . Sexual activity: Yes    Partners: Female  Other Topics Concern  . Not on file  Social History Narrative  . Not on file   Social Determinants of Health   Financial Resource Strain: Not on file  Food Insecurity: Not on file  Transportation Needs: Not on file  Physical Activity: Not on file  Stress: Not on file  Social Connections: Not on file  Vitals:   11/06/20 0654  BP: 128/70  Pulse: 61  Resp: 12  Temp: 98.2 F (36.8 C)  SpO2: 99%   Body mass index is 27.37 kg/m.  Wt Readings from Last 3 Encounters:  11/06/20 180 lb (81.6 kg)  06/08/19 168 lb (76.2 kg)  04/06/19 175 lb 6 oz (79.5 kg)   Physical Exam Vitals and nursing note reviewed.  Constitutional:       General: He is not in acute distress.    Appearance: He is well-developed.  HENT:     Head: Atraumatic.     Right Ear: Tympanic membrane, ear canal and external ear normal.     Left Ear: Tympanic membrane, ear canal and external ear normal.  Eyes:     Conjunctiva/sclera: Conjunctivae normal.     Pupils: Pupils are equal, round, and reactive to light.  Neck:     Thyroid: No thyromegaly.     Trachea: No tracheal deviation.  Cardiovascular:     Rate and Rhythm: Normal rate and regular rhythm.     Pulses:          Dorsalis pedis pulses are 2+ on the right side and 2+ on the left side.     Heart sounds: No murmur heard.   Pulmonary:     Effort: Pulmonary effort is normal. No respiratory distress.     Breath sounds: Normal breath sounds.  Chest:  Breasts:     Right: No supraclavicular adenopathy.     Left: No supraclavicular adenopathy.    Abdominal:     Palpations: Abdomen is soft. There is no hepatomegaly or mass.     Tenderness: There is no abdominal tenderness.  Genitourinary:    Comments: No concerns. Musculoskeletal:        General: No tenderness.     Cervical back: Normal range of motion.     Comments: No major deformities appreciated and no signs of synovitis.  Lymphadenopathy:     Cervical: No cervical adenopathy.     Upper Body:     Right upper body: No supraclavicular adenopathy.     Left upper body: No supraclavicular adenopathy.  Skin:    General: Skin is warm.     Findings: No erythema.  Neurological:     Mental Status: He is alert and oriented to person, place, and time.     Cranial Nerves: No cranial nerve deficit.     Sensory: No sensory deficit.     Coordination: Coordination normal.     Gait: Gait normal.     Deep Tendon Reflexes:     Reflex Scores:      Bicep reflexes are 2+ on the right side and 2+ on the left side.      Patellar reflexes are 2+ on the right side and 2+ on the left side.  ASSESSMENT AND PLAN:  Mr.Kent Johnson was seen today for  annual exam.  Diagnoses and all orders for this visit: Orders Placed This Encounter  Procedures  . Basic metabolic panel  . Lipid panel  . Hepatitis C antibody screen   Lab Results  Component Value Date   CREATININE 1.17 11/06/2020   BUN 15 11/06/2020   NA 139 11/06/2020   K 4.2 11/06/2020   CL 100 11/06/2020   CO2 32 11/06/2020   Lab Results  Component Value Date   CHOL 170 11/06/2020   HDL 54.50 11/06/2020   LDLCALC 97 11/06/2020   TRIG 91.0 11/06/2020   CHOLHDL 3 11/06/2020   Routine  general medical examination at a health care facility He understands the importance of regular physical activity and healthy diet for prevention of chronic illness and/or complications. Preventive guidelines reviewed. Vaccination up to date.  Next CPE in a year.  Encounter for HCV screening test for low risk patient -     Hepatitis C antibody screen  Screening for lipoid disorders -     Lipid panel  Screening for endocrine, metabolic and immunity disorder -     Basic metabolic panel   Return in 1 year (on 11/06/2021) for CPE.   Mollee Neer G. [name], MD  [facility]. [place] [facility].  A few things to remember from today's visit:  No diagnosis found.  Please be sure medication list is accurate. If a new problem present, please set up appointment sooner than planned today.  At least 150 minutes of moderate exercise per week, daily brisk walking for 15-30 min is a good exercise option. Healthy diet low in saturated (animal) fats and sweets and consisting of fresh fruits and vegetables, lean meats such as fish and white chicken and whole grains.  - Vaccines:  Tdap vaccine every 10 years.  Shingles vaccine recommended at age 9, could be given after 34 years of age but not sure about insurance coverage.  Pneumonia vaccines: Pneumovax at 75  -Screening recommendations for low/normal risk males:  Screening for diabetes at age 28 and every 3 years. Earlier screening if  cardiovascular risk factors.   Lipid screening at 35 and every 3 years. Screening starts in younger males with cardiovascular risk factors.  Colon cancer screening is now at age 56 but your insurance may not cover until age 59 .screening is recommended age 92.  Prostate cancer screening: some controversy, starts usually at 50: Rectal exam and PSA.  Aortic Abdominal Aneurism once between 6 and 23 years old if ever smoker.  Also recommended:  1. Dental visit- Brush and floss your teeth twice daily; visit your dentist twice a year. 2. Eye doctor- Get an eye exam at least every 2 years. 3. Helmet use- Always wear a helmet when riding a bicycle, motorcycle, rollerblading or skateboarding. 4. Safe sex- If you may be exposed to sexually transmitted infections, use a condom. 5. Seat belts- Seat belts can save your live; always wear one. 6. Smoke/Carbon Monoxide detectors- These detectors need to be installed on the appropriate level of your home. Replace batteries at least once a year. 7. Skin cancer- When out in the sun please cover up and use sunscreen 15 SPF or higher. 8. Violence- If anyone is threatening or hurting you, please tell your healthcare provider.  9. Drink alcohol in moderation- Limit alcohol intake to one drink or less per day. Never drink and drive.

## 2020-11-07 LAB — HEPATITIS C ANTIBODY
Hepatitis C Ab: NONREACTIVE
SIGNAL TO CUT-OFF: 0 (ref <1.00)

## 2021-11-06 ENCOUNTER — Ambulatory Visit (INDEPENDENT_AMBULATORY_CARE_PROVIDER_SITE_OTHER): Payer: Managed Care, Other (non HMO) | Admitting: Orthopaedic Surgery

## 2021-11-06 ENCOUNTER — Encounter: Payer: Self-pay | Admitting: Orthopaedic Surgery

## 2021-11-06 ENCOUNTER — Ambulatory Visit (INDEPENDENT_AMBULATORY_CARE_PROVIDER_SITE_OTHER): Payer: Managed Care, Other (non HMO)

## 2021-11-06 ENCOUNTER — Other Ambulatory Visit: Payer: Self-pay

## 2021-11-06 DIAGNOSIS — G8929 Other chronic pain: Secondary | ICD-10-CM | POA: Diagnosis not present

## 2021-11-06 DIAGNOSIS — M25512 Pain in left shoulder: Secondary | ICD-10-CM

## 2021-11-06 DIAGNOSIS — M25531 Pain in right wrist: Secondary | ICD-10-CM | POA: Diagnosis not present

## 2021-11-06 NOTE — Progress Notes (Signed)
? ?Office Visit Note ?  ?Patient: Kent Johnson           ?Date of Birth: 26-Jan-1986           ?MRN: [id number] ?Visit Date: 11/06/2021 ?             ?Requested by: [name], [name] G, MD ?[phone] ?[place],  [place] [address] ?PCP: [name], [name] G, MD ? ? ?Assessment & Plan: ?Visit Diagnoses:  ?1. Chronic left shoulder pain   ?2. Pain in right wrist   ? ? ?Plan: Given the muscle atrophy around his left shoulder and significant weakness with pushoff, a MRI of the left shoulder is warranted to rule out a tear of the subscapularis tendon as well as any muscle atrophy they can suggest nerve compression.  We will be looking for cystic changes around the notch of the scapula as well they can create weakness.  We will see him back after the MRI.  He agrees with this treatment plan.  I will have him avoid push-ups as it affects his right wrist ? ?Follow-Up Instructions: No follow-ups on file.  Follow-up after MRI ? ?Orders:  ?Orders Placed This Encounter  ?Procedures  ? XR Wrist Complete Right  ? XR Shoulder Left  ? ?No orders of the defined types were placed in this encounter. ? ? ? ? Procedures: ?No procedures performed ? ? ?Clinical Data: ?No additional findings. ? ? ?Subjective: ?Chief Complaint  ?Patient presents with  ? Left Shoulder - Pain  ? Right Wrist - Pain  ?Patient is a 36 year old gentleman last seen before.  He is actually left-hand-dominant and has been having left shoulder pain and weakness for about 10 years now and this is his throwing arm as well.  He feels weakness in that shoulder.  He is falls over the years from skiing and other activities.  He is also been developing right wrist pain which is noticed when he is doing push-ups and he cannot really put weight on the right wrist.  He denies any specific injury to the right wrist and he denies any numbness and tingling in his hand.  This has been going on and off for just several months now.  The left shoulder has been the more  significant problem for him ? ?HPI ? ?Review of Systems ?[place].  He takes anti-inflammatories on occasion.  There is currently listed no headache, chest pain, shortness of breath, fever, chills, nausea ? ?Objective: ?Vital Signs: There were no vitals taken for this visit. ? ?Physical Exam ?He is alert and orient x3 and in no acute distress ?Ortho Exam ?Examination of his left shoulder does show some slight muscle atrophy around the posterior shoulder.  He also has a positive liftoff in terms of significant weakness of the subscapularis tendon.  His right side is 5 out of 5 strength but his left dominant side is significantly weak with subscapularis and 4 out of 5 strength with abduction and external rotation. ?Specialty Comments:  ?No specialty comments available. ? ?Imaging: ?XR Wrist Complete Right ? ?Result Date: 11/06/2021 ?3 views of the right wrist are negative.  There is no acute findings and the carpal bones and wrist joint are all well-maintained and in normal alignment and relationships. ? ?XR Shoulder Left ? ?Result Date: 11/06/2021 ?3 views of the left shoulder show no acute findings.  The shoulder is well located.  ? ? ?PMFS History: ?There are no problems to display for this patient. ? ?History reviewed.  No pertinent past medical history.  ?Family History  ?Problem Relation Age of Onset  ? Cancer Mother   ? Miscarriages / [place] Mother   ? Asthma Brother   ? Arthritis Maternal Grandmother   ? Cancer Maternal Grandfather   ? Heart attack Maternal Grandfather   ?  ?Past Surgical History:  ?Procedure Laterality Date  ? CLAVICLE SURGERY  2011  ? broke collar bone skiing  ? LIPOMA EXCISION  2007  ? benign lipoma in colon, caused intersepcion  ? ?Social History  ? ?Occupational History  ? Not on file  ?Tobacco Use  ? Smoking status: Never  ? Smokeless tobacco: Never  ?Vaping Use  ? Vaping Use: Not on file  ?Substance and Sexual Activity  ? Alcohol use: Never  ? Drug use: Never  ? Sexual activity: Yes   ?  Partners: Female  ? ? ? ? ? ? ?

## 2021-11-08 NOTE — Patient Instructions (Addendum)
A few things to remember from today's visit: ? ? ?Routine general medical examination at a health care facility ? ?Screening for lipoid disorders - Plan: Lipid panel ? ?Screening for endocrine, metabolic and immunity disorder - Plan: Basic Metabolic Panel ? ?Anxiety disorder, unspecified type - Plan: escitalopram (LEXAPRO) 10 MG tablet ? ?Insomnia, unspecified type - Plan: TSH ? ?If you need refills please call your pharmacy. ?Do not use My Chart to request refills or for acute issues that need immediate attention. ?  ?Today we are starting Lexapro for anxiety and hopefully it will improve sleep. ? ?Please be sure medication list is accurate. ?If a new problem present, please set up appointment sooner than planned today. ? ? ?Health Maintenance, Male ?Adopting a healthy lifestyle and getting preventive care are important in promoting health and wellness. Ask your health care provider about: ?The right schedule for you to have regular tests and exams. ?Things you can do on your own to prevent diseases and keep yourself healthy. ?What should I know about diet, weight, and exercise? ?Eat a healthy diet ? ?Eat a diet that includes plenty of vegetables, fruits, low-fat dairy products, and lean protein. ?Do not eat a lot of foods that are high in solid fats, added sugars, or sodium. ?Maintain a healthy weight ?Body mass index (BMI) is a measurement that can be used to identify possible weight problems. It estimates body fat based on height and weight. Your health care provider can help determine your BMI and help you achieve or maintain a healthy weight. ?Get regular exercise ?Get regular exercise. This is one of the most important things you can do for your health. Most adults should: ?Exercise for at least 150 minutes each week. The exercise should increase your heart rate and make you sweat (moderate-intensity exercise). ?Do strengthening exercises at least twice a week. This is in addition to the moderate-intensity  exercise. ?Spend less time sitting. Even light physical activity can be beneficial. ?Watch cholesterol and blood lipids ?Have your blood tested for lipids and cholesterol at 35 years of age, then have this test every 5 years. ?You may need to have your cholesterol levels checked more often if: ?Your lipid or cholesterol levels are high. ?You are older than 35 years of age. ?You are at high risk for heart disease. ?What should I know about cancer screening? ?Many types of cancers can be detected early and may often be prevented. Depending on your health history and family history, you may need to have cancer screening at various ages. This may include screening for: ?Colorectal cancer. ?Prostate cancer. ?Skin cancer. ?Lung cancer. ?What should I know about heart disease, diabetes, and high blood pressure? ?Blood pressure and heart disease ?High blood pressure causes heart disease and increases the risk of stroke. This is more likely to develop in people who have high blood pressure readings or are overweight. ?Talk with your health care provider about your target blood pressure readings. ?Have your blood pressure checked: ?Every 3-5 years if you are 73-38 years of age. ?Every year if you are 77 years old or older. ?If you are between the ages of 57 and 27 and are a current or former smoker, ask your health care provider if you should have a one-time screening for abdominal aortic aneurysm (AAA). ?Diabetes ?Have regular diabetes screenings. This checks your fasting blood sugar level. Have the screening done: ?Once every three years after age 92 if you are at a normal weight and have a low risk  for diabetes. ?More often and at a younger age if you are overweight or have a high risk for diabetes. ?What should I know about preventing infection? ?Hepatitis B ?If you have a higher risk for hepatitis B, you should be screened for this virus. Talk with your health care provider to find out if you are at risk for hepatitis B  infection. ?Hepatitis C ?Blood testing is recommended for: ?Everyone born from [birth year] through 1964. ?Anyone with known risk factors for hepatitis C. ?Sexually transmitted infections (STIs) ?You should be screened each year for STIs, including gonorrhea and chlamydia, if: ?You are sexually active and are younger than 35 years of age. ?You are older than 35 years of age and your health care provider tells you that you are at risk for this type of infection. ?Your sexual activity has changed since you were last screened, and you are at increased risk for chlamydia or gonorrhea. Ask your health care provider if you are at risk. ?Ask your health care provider about whether you are at high risk for HIV. Your health care provider may recommend a prescription medicine to help prevent HIV infection. If you choose to take medicine to prevent HIV, you should first get tested for HIV. You should then be tested every 3 months for as long as you are taking the medicine. ?Follow these instructions at home: ?Alcohol use ?Do not drink alcohol if your health care provider tells you not to drink. ?If you drink alcohol: ?Limit how much you have to 0-2 drinks a day. ?Know how much alcohol is in your drink. In the [place] one drink equals one 12 oz bottle of beer (355 mL), one 5 oz glass of wine (148 mL), or one 1? oz glass of hard liquor (44 mL). ?Lifestyle ?Do not use any products that contain nicotine or tobacco. These products include cigarettes, chewing tobacco, and vaping devices, such as e-cigarettes. If you need help quitting, ask your health care provider. ?Do not use street drugs. ?Do not share needles. ?Ask your health care provider for help if you need support or information about quitting drugs. ?General instructions ?Schedule regular health, dental, and eye exams. ?Stay current with your vaccines. ?Tell your health care provider if: ?You often feel depressed. ?You have ever been abused or do not feel safe at  home. ?Summary ?Adopting a healthy lifestyle and getting preventive care are important in promoting health and wellness. ?Follow your health care provider's instructions about healthy diet, exercising, and getting tested or screened for diseases. ?Follow your health care provider's instructions on monitoring your cholesterol and blood pressure. ?This information is not intended to replace advice given to you by your health care provider. Make sure you discuss any questions you have with your health care provider. ?Document Revised: 11/26/2020 Document Reviewed: 11/26/2020 ?Elsevier Patient Education ? 2023 [employer] ? ?

## 2021-11-08 NOTE — Progress Notes (Signed)
? ?HPI: ?[name] is a 36 y.o.male here today for his routine physical examination. ? ?Last CPE: 11/06/20 ? ?Regular exercise: swimming once per week, biking,and walking+ playing golf. ?Following a healthful diet: Cooks at home most of the time, no a lot of fried food and vegetables with each food. ? ?Chronic medical problems: Allergic rhinitis and anxiety among some. ? ?Immunization History  ?Administered Date(s) Administered  ? Influenza,inj,Quad PF,6+ Mos 04/06/2019  ? Tdap 07/22/2015  ? ?Health Maintenance  ?Topic Date Due  ? COVID-19 Vaccine (1) 11/24/2021 (Originally [birth year]/24/1987)  ? HIV Screening  06/07/2024 (Originally 08/26/2001)  ? INFLUENZA VACCINE  02/18/2022  ? TETANUS/TDAP  07/21/2025  ? Hepatitis C Screening  Completed  ? HPV VACCINES  Aged Out  ? ?-Negative for high alcohol intake or tobacco use. ? ?-Concerns and/or follow up today:  ? ?Insomnia and anxiety: ?For years he has had trouble sleeping most of nights, attributed to anxiety, sleeping about 4 hours at the time. ?Has tried chamomile tea,Unisom, and melatonin and unsuccessfully. He has als tried to improved sleep hygiene. ? ?  11/11/2021  ?  7:56 AM  ?GAD 7 : Generalized Anxiety Score  ?Nervous, Anxious, on Edge 1  ?Control/stop worrying 0  ?Worry too much - different things 2  ?Trouble relaxing 2  ?Restless 1  ?Easily annoyed or irritable 1  ?Afraid - awful might happen 0  ?Total GAD 7 Score 7  ?Anxiety Difficulty Somewhat difficult  ? ? ? ?  11/11/2021  ?  7:56 AM 11/11/2021  ?  7:02 AM 11/06/2020  ?  6:55 AM  ?Depression screen PHQ 2/9  ?Decreased Interest 0 0 0  ?Down, Depressed, Hopeless 0 0 0  ?PHQ - 2 Score 0 0 0  ? ?Review of Systems  ?Constitutional:  Positive for fatigue. Negative for activity change, appetite change, fever and unexpected weight change.  ?HENT:  Negative for nosebleeds, sore throat, trouble swallowing and voice change.   ?Eyes:  Negative for redness and visual disturbance.  ?Respiratory:  Negative for  cough, shortness of breath and wheezing.   ?Cardiovascular:  Negative for chest pain, palpitations and leg swelling.  ?Gastrointestinal:  Negative for abdominal pain, blood in stool, nausea and vomiting.  ?Endocrine: Negative for cold intolerance, heat intolerance, polydipsia, polyphagia and polyuria.  ?Genitourinary:  Negative for decreased urine volume, dysuria, genital sores, hematuria and testicular pain.  ?Musculoskeletal:  Negative for gait problem and myalgias.  ?Skin:  Negative for color change and rash.  ?Allergic/Immunologic: Positive for environmental allergies.  ?Neurological:  Negative for syncope, weakness and headaches.  ?Hematological:  Negative for adenopathy. Does not bruise/bleed easily.  ?Psychiatric/Behavioral:  Positive for sleep disturbance. Negative for confusion. The patient is nervous/anxious.   ?All other systems reviewed and are negative. ? ?Current Outpatient Medications on File Prior to Visit  ?Medication Sig Dispense Refill  ? fexofenadine (ALLEGRA) 180 MG tablet Take 180 mg by mouth daily.    ? ?No current facility-administered medications on file prior to visit.  ? ?Past Medical History:  ?Diagnosis Date  ? Anxiety   ? ? ?Past Surgical History:  ?Procedure Laterality Date  ? CLAVICLE SURGERY  2011  ? broke collar bone skiing  ? LIPOMA EXCISION  2007  ? benign lipoma in colon, caused intersepcion  ? ?No Known Allergies ? ?Family History  ?Problem Relation Age of Onset  ? Anxiety disorder Mother   ? Cancer Mother   ?     melanoma  ? Miscarriages /  Stillbirths Mother   ? Asthma Brother   ? Arthritis Maternal Grandmother   ? Cancer Maternal Grandfather   ? Heart attack Maternal Grandfather   ? ?Social History  ? ?Socioeconomic History  ? Marital status: Married  ?  Spouse name: Not on file  ? Number of children: Not on file  ? Years of education: Not on file  ? Highest education level: Not on file  ?Occupational History  ? Not on file  ?Tobacco Use  ? Smoking status: Never  ? Smokeless  tobacco: Never  ?Vaping Use  ? Vaping Use: Not on file  ?Substance and Sexual Activity  ? Alcohol use: Never  ? Drug use: Never  ? Sexual activity: Yes  ?  Partners: Female  ?Other Topics Concern  ? Not on file  ?Social History Narrative  ? Not on file  ? ?Social Determinants of Health  ? ?Financial Resource Strain: Not on file  ?Food Insecurity: Not on file  ?Transportation Needs: Not on file  ?Physical Activity: Not on file  ?Stress: Not on file  ?Social Connections: Not on file  ? ?Vitals:  ? 11/11/21 0658  ?BP: 120/70  ?Pulse: 79  ?Resp: 16  ?SpO2: 99%  ?Body mass index is 26.76 kg/m?. ?Wt Readings from Last 3 Encounters:  ?11/11/21 176 lb (79.8 kg)  ?11/06/20 180 lb (81.6 kg)  ?06/08/19 168 lb (76.2 kg)  ? ?Physical Exam ?Vitals and nursing note reviewed.  ?Constitutional:   ?   General: He is not in acute distress. ?   Appearance: He is well-developed.  ?HENT:  ?   Head: Normocephalic and atraumatic.  ?   Right Ear: Tympanic membrane, ear canal and external ear normal.  ?   Left Ear: Tympanic membrane, ear canal and external ear normal.  ?   Mouth/Throat:  ?   Mouth: Mucous membranes are moist.  ?   Pharynx: Oropharynx is clear.  ?Eyes:  ?   Extraocular Movements: Extraocular movements intact.  ?   Conjunctiva/sclera: Conjunctivae normal.  ?   Pupils: Pupils are equal, round, and reactive to light.  ?Neck:  ?   Thyroid: No thyroid mass or thyromegaly.  ?Cardiovascular:  ?   Rate and Rhythm: Normal rate and regular rhythm.  ?   Pulses:     ?     Dorsalis pedis pulses are 2+ on the right side and 2+ on the left side.  ?   Heart sounds: No murmur heard. ?Pulmonary:  ?   Effort: Pulmonary effort is normal. No respiratory distress.  ?   Breath sounds: Normal breath sounds.  ?Abdominal:  ?   Palpations: Abdomen is soft. There is no hepatomegaly or mass.  ?   Tenderness: There is no abdominal tenderness.  ?Genitourinary: ?   Comments: No concerns. ?Musculoskeletal:     ?   General: No tenderness.  ?   Cervical back:  Normal range of motion.  ?   Comments: No major deformities appreciated and no signs of synovitis.  ?Lymphadenopathy:  ?   Cervical: No cervical adenopathy.  ?   Upper Body:  ?   Right upper body: No supraclavicular adenopathy.  ?   Left upper body: No supraclavicular adenopathy.  ?Skin: ?   General: Skin is warm.  ?   Findings: No erythema.  ?Neurological:  ?   Mental Status: He is alert and oriented to person, place, and time.  ?   Cranial Nerves: No cranial nerve deficit.  ?  Sensory: No sensory deficit.  ?   Coordination: Coordination normal.  ?   Gait: Gait normal.  ?   Deep Tendon Reflexes:  ?   Reflex Scores: ?     Bicep reflexes are 2+ on the right side and 2+ on the left side. ?     Patellar reflexes are 2+ on the right side and 2+ on the left side. ?Psychiatric:     ?   Mood and Affect: Affect normal.  ? ?ASSESSMENT AND PLAN: ? ?Mr.Kent Johnson was seen today for annual exam, insomnia and anxiety. ? ?Diagnoses and all orders for this visit: ?Orders Placed This Encounter  ?Procedures  ? Lipid panel  ? Basic Metabolic Panel  ? TSH  ? ?Lab Results  ?Component Value Date  ? CREATININE 1.16 11/11/2021  ? BUN 15 11/11/2021  ? NA 140 11/11/2021  ? K 4.1 11/11/2021  ? CL 104 11/11/2021  ? CO2 29 11/11/2021  ? ?Lab Results  ?Component Value Date  ? TSH 2.59 11/11/2021  ? ?Lab Results  ?Component Value Date  ? CHOL 172 11/11/2021  ? HDL 56.40 11/11/2021  ? LDLCALC 100 (H) 11/11/2021  ? TRIG 79.0 11/11/2021  ? CHOLHDL 3 11/11/2021  ? ?Routine general medical examination at a health care facility ?We discussed the importance of regular physical activity and healthy diet for prevention of chronic illness and/or complications. ?Preventive guidelines reviewed. ?Vaccination up to date. ? ?Next CPE in a year. ? ?Screening for lipoid disorders ?-     Lipid panel ? ?Screening for endocrine, metabolic and immunity disorder ?-     Basic Metabolic Panel ? ?Insomnia ?Attributed to anxiety. ?We discussed a few pharmacologic  treatment options, Trazodone. Instead trying something specifically for sleep,he would like treating anxiety. ?Continue good sleep hygiene. ? ?Anxiety disorder ?We discussed treatment options. ?He agrees with

## 2021-11-11 ENCOUNTER — Ambulatory Visit
Admission: RE | Admit: 2021-11-11 | Discharge: 2021-11-11 | Disposition: A | Discharge status: Home / Self Care | Payer: Managed Care, Other (non HMO) | Source: Ambulatory Visit | Attending: Orthopaedic Surgery | Admitting: Orthopaedic Surgery

## 2021-11-11 ENCOUNTER — Encounter: Payer: Self-pay | Admitting: Family Medicine

## 2021-11-11 ENCOUNTER — Ambulatory Visit (INDEPENDENT_AMBULATORY_CARE_PROVIDER_SITE_OTHER): Payer: Managed Care, Other (non HMO) | Admitting: Family Medicine

## 2021-11-11 VITALS — BP 120/70 | HR 79 | Resp 16 | Ht 68.0 in | Wt 176.0 lb

## 2021-11-11 DIAGNOSIS — Z13220 Encounter for screening for lipoid disorders: Secondary | ICD-10-CM | POA: Diagnosis not present

## 2021-11-11 DIAGNOSIS — Z1329 Encounter for screening for other suspected endocrine disorder: Secondary | ICD-10-CM

## 2021-11-11 DIAGNOSIS — G4700 Insomnia, unspecified: Secondary | ICD-10-CM | POA: Insufficient documentation

## 2021-11-11 DIAGNOSIS — F419 Anxiety disorder, unspecified: Secondary | ICD-10-CM

## 2021-11-11 DIAGNOSIS — Z0000 Encounter for general adult medical examination without abnormal findings: Secondary | ICD-10-CM

## 2021-11-11 DIAGNOSIS — Z130 Encounter for screening for diseases of the blood and blood-forming organs and certain disorders involving the immune mechanism: Secondary | ICD-10-CM

## 2021-11-11 DIAGNOSIS — Z13228 Encounter for screening for other metabolic disorders: Secondary | ICD-10-CM | POA: Diagnosis not present

## 2021-11-11 DIAGNOSIS — G8929 Other chronic pain: Secondary | ICD-10-CM

## 2021-11-11 LAB — LIPID PANEL
Cholesterol: 172 mg/dL (ref 0–200)
HDL: 56.4 mg/dL (ref >39.00)
LDL Cholesterol: 100 mg/dL — ABNORMAL HIGH (ref 0–99)
NonHDL: 115.86
Total CHOL/HDL Ratio: 3
Triglycerides: 79 mg/dL (ref 0.0–149.0)
VLDL: 15.8 mg/dL (ref 0.0–40.0)

## 2021-11-11 LAB — BASIC METABOLIC PANEL
BUN: 15 mg/dL (ref 6–23)
CO2: 29 mEq/L (ref 19–32)
Calcium: 9.5 mg/dL (ref 8.4–10.5)
Chloride: 104 mEq/L (ref 96–112)
Creatinine, Ser: 1.16 mg/dL (ref 0.40–1.50)
GFR: 81.77 mL/min (ref >60.00)
Glucose, Bld: 92 mg/dL (ref 70–99)
Potassium: 4.1 mEq/L (ref 3.5–5.1)
Sodium: 140 mEq/L (ref 135–145)

## 2021-11-11 LAB — TSH: TSH: 2.59 u[IU]/mL (ref 0.35–5.50)

## 2021-11-11 IMAGING — MR MR SHOULDER*L* W/O CM
6 of 7 series · 30 of 40 positions shown · non-contrast
Comparison: None.

CLINICAL DATA: Shoulder pain, labral tear suspected, xray done r/o
subscap tear

EXAM:
MRI OF THE LEFT SHOULDER WITHOUT CONTRAST
TECHNIQUE: Multiplanar, multisequence MR imaging of the shoulder was performed.
No intravenous contrast was administered.

[Series 4: T2 fat-sat · axial · 4.0mm · 0.27mm/px · z∈[-94,+47]mm · 8 of 30 slices shown (1 of 4)]
[im 1/30]
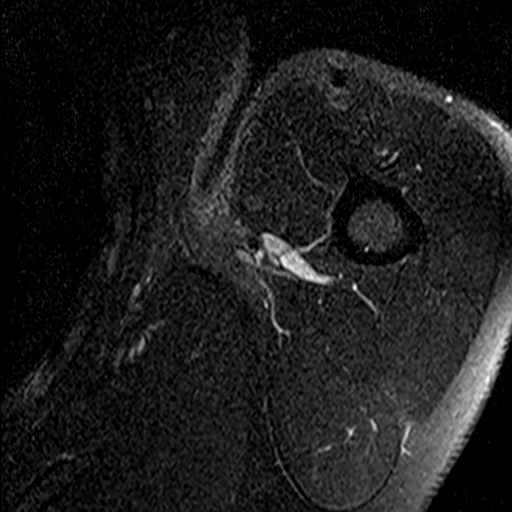
[im 5/30]
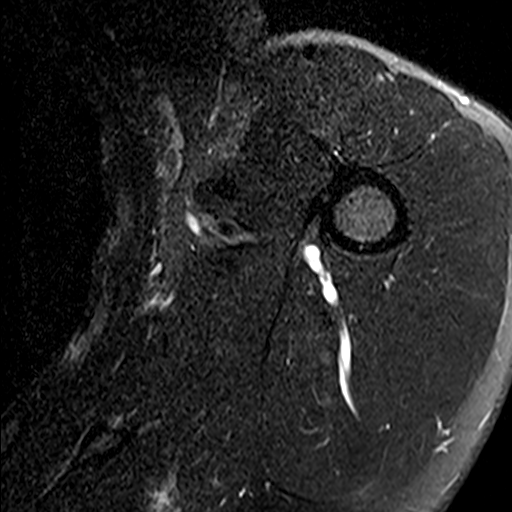
[im 9/30]
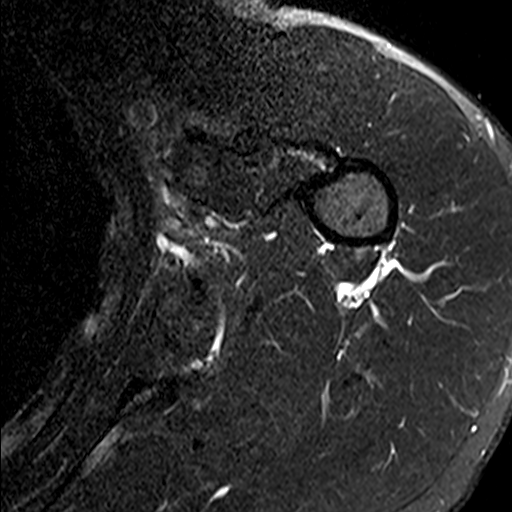
[im 13/30]
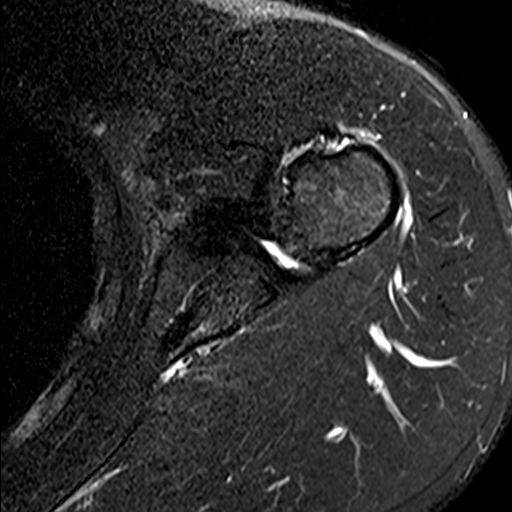
[im 17/30]
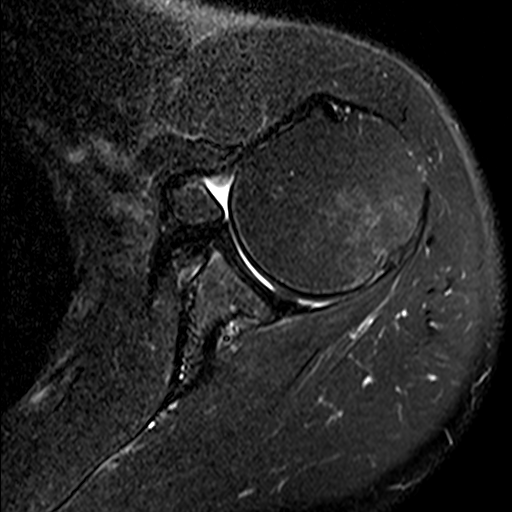
[im 21/30]
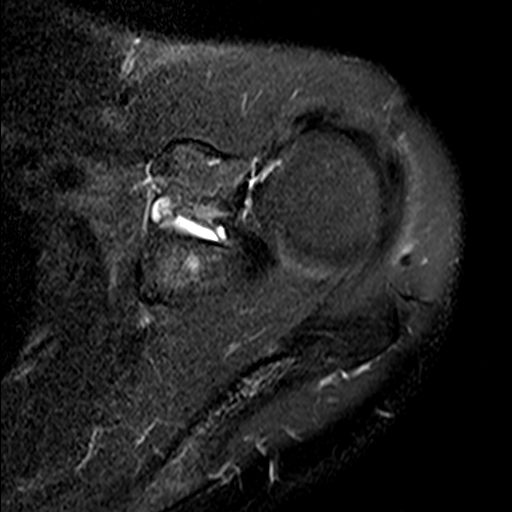
[im 25/30]
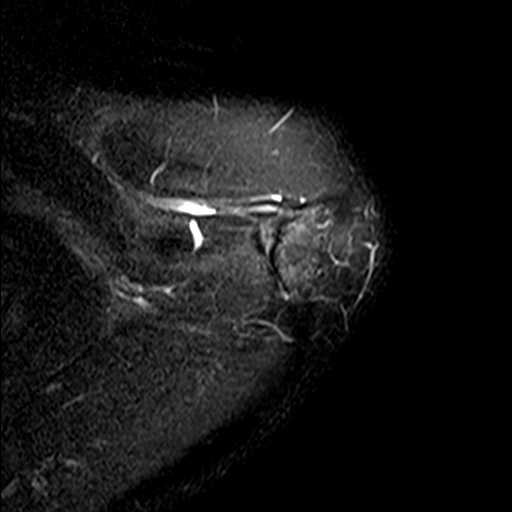
[im 30/30]
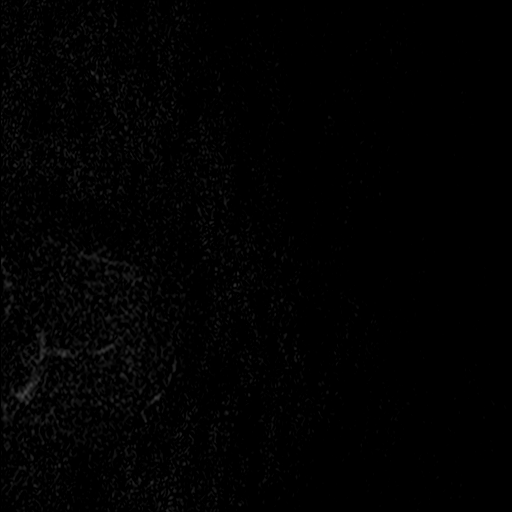

[Series 5: T2 fat-sat · oblique · 4.0mm · 0.29mm/px · 5 of 18 slices shown (2 of 4)]
[im 1/18]
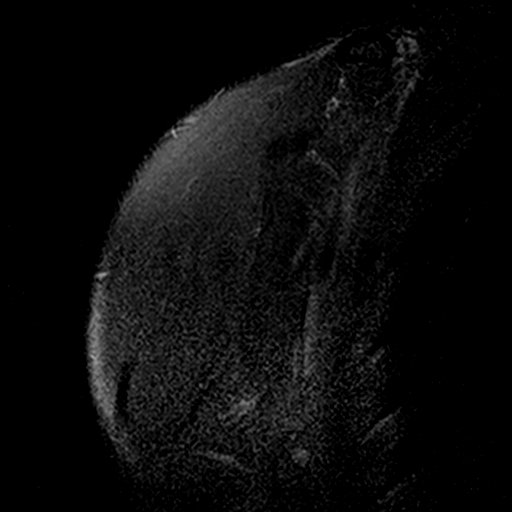
[im 5/18]
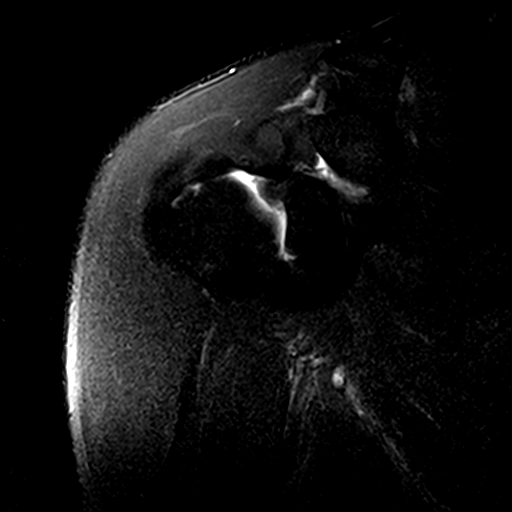
[im 9/18]
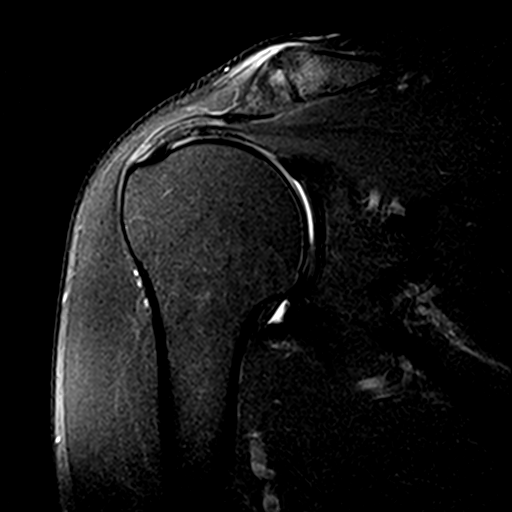
[im 13/18]
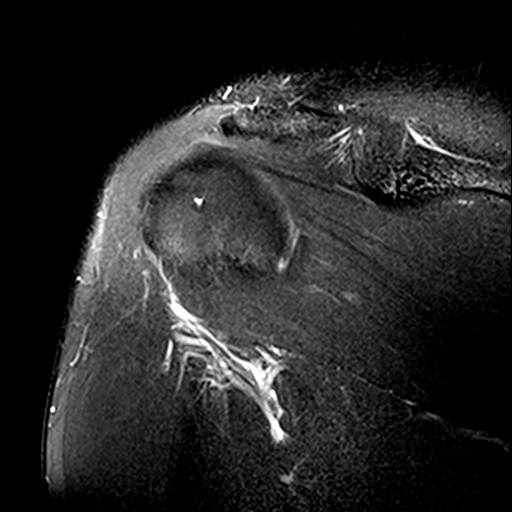
[im 18/18]
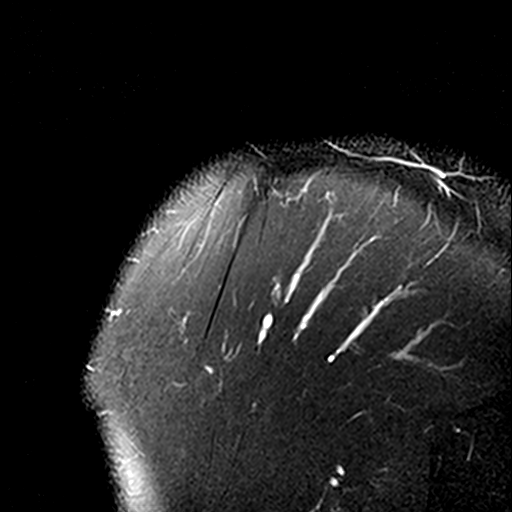

[Series 6: PD · oblique · 4.0mm · 0.59mm/px · 5 of 18 slices shown (1 of 2)]
[im 1/18]
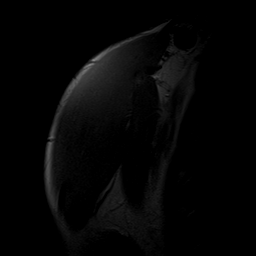
[im 5/18]
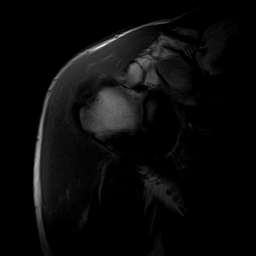
[im 9/18]
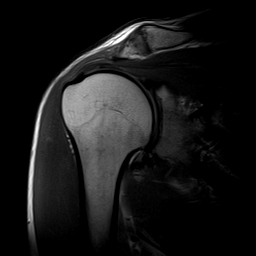
[im 13/18]
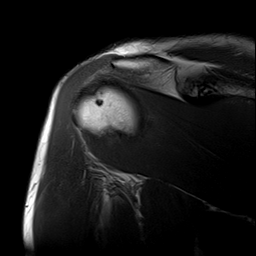
[im 18/18]
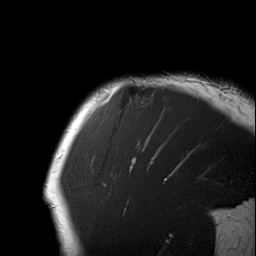

[Series 7: T2 fat-sat · oblique · 4.0mm · 0.59mm/px · 6 of 20 slices shown (3 of 4)]
[im 1/20]
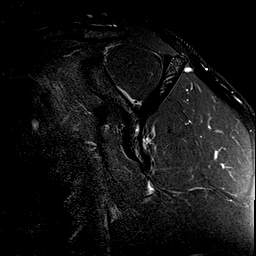
[im 4/20]
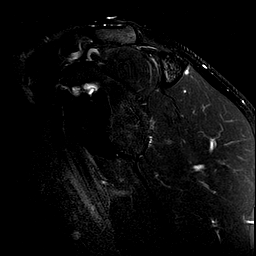
[im 8/20]
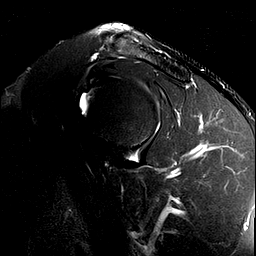
[im 12/20]
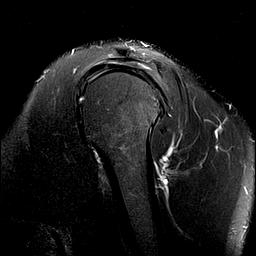
[im 16/20]
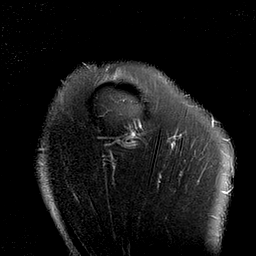
[im 20/20]
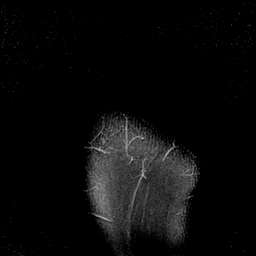

[Series 9: PD · oblique · 4.0mm · 0.59mm/px · 5 of 18 slices shown (2 of 2)]
[im 1/18]
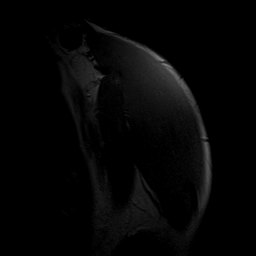
[im 5/18]
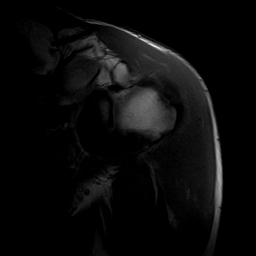
[im 9/18]
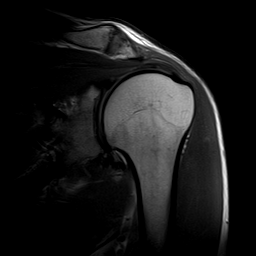
[im 13/18]
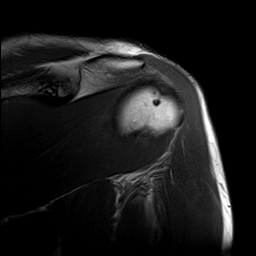
[im 18/18]
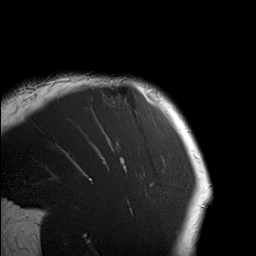

[Series 10: T2 fat-sat · oblique · 4.0mm · 0.29mm/px · 1 of 18 slices shown (4 of 4)]
[im 1/18]
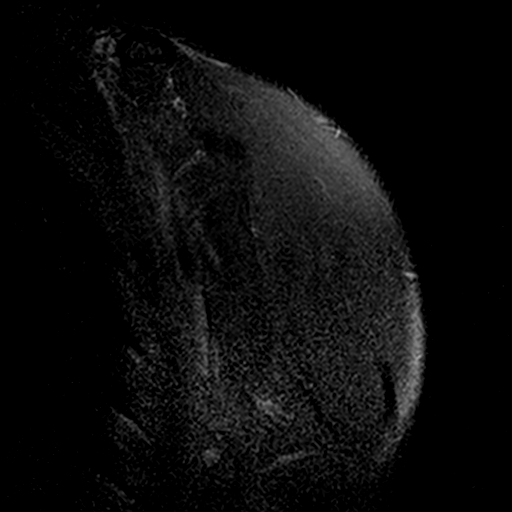

[30 of 40 positions shown; findings below may reference images not displayed]

FINDINGS: Rotator cuff: There is mild distal supraspinatus and infraspinatus
tendinosis with bursal sided fraying. There is a bulky ossification
deep to the proximal subscapularis tendon and anterior to the
glenoid measuring 1.4 x 1.1 x 2.3 cm (series 4, image 13, series 8,
image 5). There is mass effect with anterior bowing of the
subscapularis and mildly thinned appearance of the distal tendon but
no evidence of high-grade subscapularis tendon tear. Teres minor is
intact.

Muscles: Streaky grade 1 subscapularis atrophy. No supraspinatus or
infraspinatus atrophy.

Biceps Long Head: Intraarticular and extraarticular portions of the
biceps tendon are intact.

Acromioclavicular Joint: Mild-to-moderate arthropathy of the
acromioclavicular joint. No significant subacromial/subdeltoid
bursal fluid.

Glenohumeral Joint: No significant joint effusion. No chondral
defect.

Labrum: Grossly intact, but evaluation is limited by lack of
intraarticular fluid/contrast.

Bones: No fracture or dislocation. No aggressive osseous lesion.

Other: No fluid collection or hematoma.
IMPRESSION: Mild distal supraspinatus and infraspinatus tendinosis with bursal
sided fraying. No high-grade or retracted cuff tear. No significant
muscle atrophy.

Bulky ossification in between the proximal subscapularis tendon and
the glenoid measuring 1.4 x 1.1 x 2.3 cm. Mass effect on the
adjacent scapularis tendon and mildly thinned appearance of the
distal tendon but no evidence of high-grade tendon tear.

Mild to moderate acromioclavicular joint osteoarthritis.

## 2021-11-11 MED ORDER — ESCITALOPRAM OXALATE 10 MG PO TABS: 10.0000 mg | ORAL_TABLET | Freq: Every day | ORAL | 1 refills | Status: DC

## 2021-11-11 NOTE — Assessment & Plan Note (Signed)
Attributed to anxiety. ?We discussed a few pharmacologic treatment options, Trazodone. Instead trying something specifically for sleep,he would like treating anxiety. ?Continue good sleep hygiene. ?

## 2021-11-11 NOTE — Assessment & Plan Note (Signed)
We discussed treatment options. ?He agrees with trying Lexapro 10 mg daily. ?Some side effects discussed. ?CBT may also help and can be considered. ?F/U in 8 weeks,before if needed. ?

## 2021-11-20 ENCOUNTER — Encounter: Payer: Self-pay | Admitting: Orthopaedic Surgery

## 2021-11-20 ENCOUNTER — Ambulatory Visit (INDEPENDENT_AMBULATORY_CARE_PROVIDER_SITE_OTHER): Payer: Managed Care, Other (non HMO) | Admitting: Orthopaedic Surgery

## 2021-11-20 DIAGNOSIS — M25512 Pain in left shoulder: Secondary | ICD-10-CM

## 2021-11-20 DIAGNOSIS — G8929 Other chronic pain: Secondary | ICD-10-CM | POA: Diagnosis not present

## 2021-11-20 NOTE — Progress Notes (Signed)
Kent Johnson comes in today to go over the MRI of his left shoulder.  He is 36 years old and has been slowly having more shoulder pain for some time now.  He says he likely injured the shoulder as a younger person.  He is a thin and active guy.  His plain films did not show any significant findings assessment of her MRI of his shoulder after failure of conservative treatment. ? ?Surprisingly MRI shows an ossification in the front of the shoulder at the subscapularis area and the level of the glenoid.  This is likely from an old trauma but certainly is likely causing some impingement on the subscap tendon.  I went over the MRI images with him. ? ?I would like to send him to my partner Dr. Steward Drone for further evaluation and treatment of this interesting left shoulder issue.  The patient agrees with this referral as well. ?

## 2021-11-27 ENCOUNTER — Ambulatory Visit (INDEPENDENT_AMBULATORY_CARE_PROVIDER_SITE_OTHER): Payer: Managed Care, Other (non HMO) | Admitting: Orthopaedic Surgery

## 2021-11-27 DIAGNOSIS — M25512 Pain in left shoulder: Secondary | ICD-10-CM | POA: Diagnosis not present

## 2021-11-27 DIAGNOSIS — M25519 Pain in unspecified shoulder: Secondary | ICD-10-CM

## 2021-11-27 DIAGNOSIS — S46892A Other injury of other muscles, fascia and tendons at shoulder and upper arm level, left arm, initial encounter: Secondary | ICD-10-CM | POA: Diagnosis not present

## 2021-11-27 NOTE — Progress Notes (Signed)
? ?                            ? ? ?Chief Complaint: Left shoulder pain ?  ? ? ?History of Present Illness:  ? ? ?Brittian Borchert is a 36 y.o. male left-hand-dominant male presents with left shoulder pain that has now been going on for several years but is worse over the last 6 months.  He has previously been seen by my partner Dr. Ninfa Linden who is referred him to me for his left shoulder.  He is very active and enjoys pickleball, tennis, golf, working out multiple times weekly, swelling.  He is most recently picked up swimming as he has had increasing knee pain and as result has attempted to modify his exercise regiment.  With this he has noticed a palpable clicking as well as pain in the anterior aspect of the left shoulder.  He has noticed weakness with certain activities like flies in the gym.  He works as a Civil engineer, contracting.  He is hoping to get further treatment for this left shoulder as he is somewhat frustrated by the weakness and popping.  He has worked on home exercise program of the left shoulder although this is continued to be limited as result of his popping.  He has tried activity restriction although this is not preferable to him as he would like to stay active and continue swimming.  He has not had any injections. ? ? ? ?Surgical History:   ?None ? ?PMH/PSH/Family History/Social History/Meds/Allergies:   ? ?Past Medical History:  ?Diagnosis Date  ? Anxiety   ? ?Past Surgical History:  ?Procedure Laterality Date  ? CLAVICLE SURGERY  2011  ? broke collar bone skiing  ? LIPOMA EXCISION  2007  ? benign lipoma in colon, caused intersepcion  ? ?Social History  ? ?Socioeconomic History  ? Marital status: Married  ?  Spouse name: Not on file  ? Number of children: Not on file  ? Years of education: Not on file  ? Highest education level: Not on file  ?Occupational History  ? Not on file  ?Tobacco Use  ? Smoking status: Never  ? Smokeless tobacco: Never  ?Vaping Use  ? Vaping Use: Not on file   ?Substance and Sexual Activity  ? Alcohol use: Never  ? Drug use: Never  ? Sexual activity: Yes  ?  Partners: Female  ?Other Topics Concern  ? Not on file  ?Social History Narrative  ? Not on file  ? ?Social Determinants of Health  ? ?Financial Resource Strain: Not on file  ?Food Insecurity: Not on file  ?Transportation Needs: Not on file  ?Physical Activity: Not on file  ?Stress: Not on file  ?Social Connections: Not on file  ? ?Family History  ?Problem Relation Age of Onset  ? Anxiety disorder Mother   ? Cancer Mother   ?     melanoma  ? Miscarriages / [place] Mother   ? Asthma Brother   ? Arthritis Maternal Grandmother   ? Cancer Maternal Grandfather   ? Heart attack Maternal Grandfather   ? ?No Known Allergies ?Current Outpatient Medications  ?Medication Sig Dispense Refill  ? escitalopram (LEXAPRO) 10 MG tablet Take 1 tablet (10 mg total) by mouth daily. 30 tablet 1  ? fexofenadine (ALLEGRA) 180 MG tablet Take 180 mg by mouth daily.    ? ?No current facility-administered medications for this visit.  ? ?No results  found. ? ?Review of Systems:   ?A ROS was performed including pertinent positives and negatives as documented in the HPI. ? ?Physical Exam :   ?Constitutional: NAD and appears stated age ?Neurological: Alert and oriented ?Psych: Appropriate affect and cooperative ?There were no vitals taken for this visit.  ? ?Comprehensive Musculoskeletal Exam:   ? ?Musculoskeletal Exam    ?Inspection Right Left  ?Skin No atrophy or winging No atrophy or winging  ?Palpation    ?Tenderness None Subscapularis  ?Range of Motion    ?Flexion (passive) 170 170  ?Flexion (active) 170 170  ?Abduction 170 170  ?ER at the side 70 70  ?Can reach behind back to T12 L3  ?Strength    ? Full Significantly positive belly press test with weakness with liftoff behind the back  ?Special Tests    ?Pseudoparalytic No No  ?Neurologic    ?Fires PIN, radial, median, ulnar, musculocutaneous, axillary, suprascapular, long thoracic, and  spinal accessory innervated muscles. No abnormal sensibility  ?Vascular/Lymphatic    ?Radial Pulse 2+ 2+  ?Cervical Exam    ?Patient has symmetric cervical range of motion with negative Spurling's test.  ?Special Test: There is palpable click in the anterior aspect of the shoulder with range of motion overlying the subscapularis, there is some tenderness over the biceps with speeds maneuver  ? ? ? ?Imaging:   ?Xray (3 views left shoulder): ?There is a large focus of heterotopic ossification below the coracoid as viewed on the axillary view ? ?MRI (left shoulder MRI): ?Again a large focus of ossification is visualized underneath the subscapularis tendon.  There does not appear to be stretching and tearing of the subscapularis with some subluxation of the biceps tendon. ? ?I personally reviewed and interpreted the radiographs. ? ? ?Assessment:   ?36 y.o. 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  I did discuss treatment options with him.  At this time his mechanical symptoms continue to be quite bothersome for him.  He does have noticeable weakness in the setting of a tear of the subscapularis which is likely been going on for quite some time.  I did discuss possible treatment options.  At this time I do not feel an injection would benefit him significantly as this would not remove his focus of heterotopic ossification.  I did discuss that a formal rehab protocol could provide some benefit although that being said he does continue to experience significant mechanical symptoms associated with area of heterotopic ossification so I am less hopeful that physical therapy would provide any type of meaningful difference as his previous home exercise rotator cuff programs have led to aggravation of the symptoms.  At this time given his young age and desire to stay active, I do believe that he would benefit from a surgery to remove his  focus of heterotopic ossification.  I would plan to send this for pathology as well.  I would plan to repair the subscapularis tendon in an open fashion at that time.  We did discuss that he does also have some biceps symptoms as well.  There is evidence of vital sign of subluxation on the MRI.  As result I do believe that he would benefit from a biceps tenodesis at the time of open subscapularis repair.  After discussion of his options at some length.  He has opted for surgical intervention. ? ?Plan :   ? ?-Plan for left shoulder arthroscopy with mini open  subscapularis tendon repair and biceps tenodesis ? ? ? ? ?I personally saw and evaluated the patient, and participated in the management and treatment plan. ? ?Vanetta Mulders, MD ?Attending Physician, Orthopedic Surgery ? ?This document was dictated using Systems analyst. A reasonable attempt at proof reading has been made to minimize errors. ?

## 2021-12-31 NOTE — Progress Notes (Unsigned)
HPI: Kent Johnson is a 36 y.o. male, who is here today to follow on medication. He was last seen on 11/11/21 for his CPE, at that tie he reported some anxiety and insomnia. Lexapro 10 mg was started. He has tolerated medication well, no significant side effects. He reports great "improvement" of symptoms. Negative for depressed mood. Sleeping better, 6-8 hours.  He is getting ready to fly this weekend, which causes some anxiety,wonders oif he can take double lexapro the day before.     01/01/2022    7:35 AM 11/11/2021    7:56 AM 11/11/2021    7:02 AM 11/06/2020    6:55 AM  Depression screen PHQ 2/9  Decreased Interest 0 0 0 0  Down, Depressed, Hopeless 0 0 0 0  PHQ - 2 Score 0 0 0 0  Altered sleeping 1     Tired, decreased energy 0     Change in appetite 0     Feeling bad or failure about yourself  0     Trouble concentrating 0     Moving slowly or fidgety/restless 0     Suicidal thoughts 0     PHQ-9 Score 1      Review of Systems  Constitutional:  Negative for activity change, appetite change and fever.  Respiratory:  Negative for chest tightness and shortness of breath.   Cardiovascular:  Negative for chest pain and palpitations.  Gastrointestinal:  Negative for abdominal pain, nausea and vomiting.       No changes in bowel habits.  Skin:  Negative for rash.  Neurological:  Negative for tremors and headaches.  Psychiatric/Behavioral:  Negative for confusion and hallucinations.   Rest see pertinent positives and negatives per HPI.  Current Outpatient Medications on File Prior to Visit  Medication Sig Dispense Refill   fexofenadine (ALLEGRA) 180 MG tablet Take 180 mg by mouth daily.     No current facility-administered medications on file prior to visit.   Past Medical History:  Diagnosis Date   Anxiety    No Known Allergies  Social History   Socioeconomic History   Marital status: Married    Spouse name: Not on file   Number of children: Not  on file   Years of education: Not on file   Highest education level: Master's degree (e.g., MA, MS, MEng, MEd, MSW, MBA)  Occupational History   Not on file  Tobacco Use   Smoking status: Never   Smokeless tobacco: Never  Vaping Use   Vaping Use: Not on file  Substance and Sexual Activity   Alcohol use: Never   Drug use: Never   Sexual activity: Yes    Partners: Female  Other Topics Concern   Not on file  Social History Narrative   Not on file   Social Determinants of Health   Financial Resource Strain: Low Risk  (12/28/2021)   Overall Financial Resource Strain (CARDIA)    Difficulty of Paying Living Expenses: Not hard at all  Food Insecurity: No Food Insecurity (12/28/2021)   Hunger Vital Sign    Worried About Running Out of Food in the Last Year: Never true    [place] in the Last Year: Never true  Transportation Needs: No Transportation Needs (12/28/2021)   PRAPARE - Hydrologist (Medical): No    Lack of Transportation (Non-Medical): No  Physical Activity: Insufficiently Active (12/28/2021)   Exercise Vital Sign    Days of Exercise per  Week: 4 days    Minutes of Exercise per Session: 30 min  Stress: No Stress Concern Present (12/28/2021)   [place]    Feeling of Stress : Only a little  Social Connections: Unknown (12/28/2021)   Social Connection and Isolation Panel [NHANES]    Frequency of Communication with Friends and Family: Twice a week    Frequency of Social Gatherings with Friends and Family: Once a week    Attends Religious Services: Patient refused    Active Member of Clubs or Organizations: Yes    Attends Archivist Meetings: More than 4 times per year    Marital Status: Married   Vitals:   01/01/22 0735  BP: 122/80  Pulse: 64  Resp: 16  SpO2: 99%  Body mass index is 27.22 kg/m.  Physical Exam Vitals and nursing note reviewed.   Constitutional:      General: He is not in acute distress.    Appearance: He is well-developed.  HENT:     Head: Normocephalic and atraumatic.  Eyes:     Conjunctiva/sclera: Conjunctivae normal.  Cardiovascular:     Rate and Rhythm: Normal rate and regular rhythm.     Heart sounds: No murmur heard. Pulmonary:     Effort: Pulmonary effort is normal. No respiratory distress.     Breath sounds: Normal breath sounds.  Abdominal:     Palpations: Abdomen is soft. There is no mass.     Tenderness: There is no abdominal tenderness.  Skin:    General: Skin is warm.     Findings: No erythema.  Neurological:     Mental Status: He is alert and oriented to person, place, and time.  Psychiatric:        Mood and Affect: Mood is not anxious or depressed.        Thought Content: Thought content does not include suicidal ideation. Thought content does not include suicidal plan.   ASSESSMENT AND PLAN:  Mr. Deaire was seen today for follow-up.  Diagnoses and all orders for this visit:  Anxiety disorder Problem has greatly improved. Continue Lexapro 10 mg daily. For acute anxiety we can consider Hydroxyzine prn, he prefers to wait on adding medication. Information about CBT given, so he can arrange appt if at some points he feels like it is needed. As far as problem is stable, we can continue following annually.  Insomnia Improved with anxiety treatment. Continue good sleep hygiene.  Return in about 10 months (around 11/14/2022) for CPE and f/u.  Braylan Faul G. [place], MD  [facility]. [place] office.

## 2022-01-01 ENCOUNTER — Encounter: Payer: Self-pay | Admitting: Family Medicine

## 2022-01-01 ENCOUNTER — Ambulatory Visit (INDEPENDENT_AMBULATORY_CARE_PROVIDER_SITE_OTHER): Payer: Managed Care, Other (non HMO) | Admitting: Family Medicine

## 2022-01-01 VITALS — BP 122/80 | HR 64 | Resp 16 | Ht 68.0 in | Wt 179.0 lb

## 2022-01-01 DIAGNOSIS — G4700 Insomnia, unspecified: Secondary | ICD-10-CM | POA: Diagnosis not present

## 2022-01-01 DIAGNOSIS — F419 Anxiety disorder, unspecified: Secondary | ICD-10-CM | POA: Diagnosis not present

## 2022-01-01 MED ORDER — ESCITALOPRAM OXALATE 10 MG PO TABS: 10.0000 mg | ORAL_TABLET | Freq: Every day | ORAL | 2 refills | Status: DC

## 2022-01-01 NOTE — Patient Instructions (Signed)
A few things to remember from today's visit:  Anxiety disorder, unspecified type - Plan: escitalopram (LEXAPRO) 10 MG tablet  If you need refills please call your pharmacy. Do not use My Chart to request refills or for acute issues that need immediate attention.   Please be sure medication list is accurate. If a new problem present, please set up appointment sooner than planned today.  No changes today. As far as symptoms are stable, I can see you in 10/2022 for your physical.

## 2022-01-01 NOTE — Assessment & Plan Note (Addendum)
Problem has greatly improved. Continue Lexapro 10 mg daily. For acute anxiety we can consider Hydroxyzine prn, he prefers to wait on adding medication. Information about CBT given, so he can arrange appt if at some points he feels like it is needed. As far as problem is stable, we can continue following annually.

## 2022-01-01 NOTE — Assessment & Plan Note (Signed)
Improved with anxiety treatment. Continue good sleep hygiene.

## 2022-01-06 ENCOUNTER — Other Ambulatory Visit: Payer: Self-pay | Admitting: Family Medicine

## 2022-01-06 DIAGNOSIS — F419 Anxiety disorder, unspecified: Secondary | ICD-10-CM

## 2022-01-09 ENCOUNTER — Encounter: Payer: Self-pay | Admitting: Family Medicine

## 2022-02-03 ENCOUNTER — Other Ambulatory Visit (HOSPITAL_BASED_OUTPATIENT_CLINIC_OR_DEPARTMENT_OTHER): Payer: Self-pay | Admitting: Orthopaedic Surgery

## 2022-02-03 DIAGNOSIS — S46892A Other injury of other muscles, fascia and tendons at shoulder and upper arm level, left arm, initial encounter: Secondary | ICD-10-CM

## 2022-03-04 ENCOUNTER — Encounter (HOSPITAL_BASED_OUTPATIENT_CLINIC_OR_DEPARTMENT_OTHER): Payer: Self-pay | Admitting: Orthopaedic Surgery

## 2022-03-04 ENCOUNTER — Other Ambulatory Visit: Payer: Self-pay

## 2022-03-05 ENCOUNTER — Ambulatory Visit (HOSPITAL_BASED_OUTPATIENT_CLINIC_OR_DEPARTMENT_OTHER): Payer: Self-pay | Admitting: Orthopaedic Surgery

## 2022-03-05 DIAGNOSIS — S46892A Other injury of other muscles, fascia and tendons at shoulder and upper arm level, left arm, initial encounter: Secondary | ICD-10-CM

## 2022-03-06 ENCOUNTER — Ambulatory Visit (HOSPITAL_BASED_OUTPATIENT_CLINIC_OR_DEPARTMENT_OTHER): Payer: Managed Care, Other (non HMO) | Admitting: Orthopaedic Surgery

## 2022-03-06 ENCOUNTER — Ambulatory Visit (INDEPENDENT_AMBULATORY_CARE_PROVIDER_SITE_OTHER): Payer: Managed Care, Other (non HMO) | Admitting: Orthopaedic Surgery

## 2022-03-06 DIAGNOSIS — S46892A Other injury of other muscles, fascia and tendons at shoulder and upper arm level, left arm, initial encounter: Secondary | ICD-10-CM | POA: Diagnosis not present

## 2022-03-06 MED ORDER — ACETAMINOPHEN 500 MG PO TABS: 500.0000 mg | ORAL_TABLET | Freq: Three times a day (TID) | ORAL | 0 refills | Status: AC

## 2022-03-06 MED ORDER — IBUPROFEN 800 MG PO TABS: 800.0000 mg | ORAL_TABLET | Freq: Three times a day (TID) | ORAL | 0 refills | Status: AC

## 2022-03-06 MED ORDER — OXYCODONE HCL 5 MG PO CAPS: 5.0000 mg | ORAL_CAPSULE | ORAL | 0 refills | Status: DC | PRN

## 2022-03-06 MED ORDER — ASPIRIN 325 MG PO TBEC: 325.0000 mg | DELAYED_RELEASE_TABLET | Freq: Every day | ORAL | 0 refills | Status: DC

## 2022-03-06 NOTE — Progress Notes (Signed)
Chief Complaint: Left shoulder pain     History of Present Illness:   03/06/2022: Presents today for discussion of the left shoulder.  Remains quite limited with certain activities that are painful in the front of the shoulder.  Overhead activities in particular have been quite limiting for him.  He today for discussion of treatment options.  Kent Johnson is a 36 y.o. male left-hand-dominant male presents with left shoulder pain that has now been going on for several years but is worse over the last 6 months.  He has previously been seen by my partner Dr. Magnus Ivan who is referred him to me for his left shoulder.  He is very active and enjoys pickleball, tennis, golf, working out multiple times weekly, swelling.  He is most recently picked up swimming as he has had increasing knee pain and as result has attempted to modify his exercise regiment.  With this he has noticed a palpable clicking as well as pain in the anterior aspect of the left shoulder.  He has noticed weakness with certain activities like flies in the gym.  He works as a Sport and exercise psychologist.  He is hoping to get further treatment for this left shoulder as he is somewhat frustrated by the weakness and popping.  He has worked on home exercise program of the left shoulder although this is continued to be limited as result of his popping.  He has tried activity restriction although this is not preferable to him as he would like to stay active and continue swimming.  He has not had any injections.    Surgical History:   None  PMH/PSH/Family History/Social History/Meds/Allergies:    Past Medical History:  Diagnosis Date   Anxiety    Past Surgical History:  Procedure Laterality Date   CLAVICLE SURGERY  2011   broke collar bone skiing   LIPOMA EXCISION  2007   benign lipoma in colon, caused intersepcion   Social History   Socioeconomic History   Marital status: Married    Spouse  name: Fleet Contras   Number of children: Not on file   Years of education: Not on file   Highest education level: Master's degree (e.g., MA, MS, MEng, MEd, MSW, MBA)  Occupational History   Not on file  Tobacco Use   Smoking status: Never   Smokeless tobacco: Never  Vaping Use   Vaping Use: Not on file  Substance and Sexual Activity   Alcohol use: Yes    Comment: occas   Drug use: Never   Sexual activity: Yes    Partners: Female  Other Topics Concern   Not on file  Social History Narrative   Not on file   Social Determinants of Health   Financial Resource Strain: Low Risk  (12/28/2021)   Overall Financial Resource Strain (CARDIA)    Difficulty of Paying Living Expenses: Not hard at all  Food Insecurity: No Food Insecurity (12/28/2021)   Hunger Vital Sign    Worried About Running Out of Food in the Last Year: Never true    Ran Out of Food in the Last Year: Never true  Transportation Needs: No Transportation Needs (12/28/2021)   PRAPARE - Administrator, Civil Service (Medical): No    Lack of Transportation (Non-Medical): No  Physical Activity: Insufficiently Active (12/28/2021)  Exercise Vital Sign    Days of Exercise per Week: 4 days    Minutes of Exercise per Session: 30 min  Stress: No Stress Concern Present (12/28/2021)   [employer] of Occupational Health - Occupational Stress Questionnaire    Feeling of Stress : Only a little  Social Connections: Unknown (12/28/2021)   Social Connection and Isolation Panel [NHANES]    Frequency of Communication with Friends and Family: Twice a week    Frequency of Social Gatherings with Friends and Family: Once a week    Attends Religious Services: Patient refused    Database administrator or Organizations: Yes    Attends Engineer, structural: More than 4 times per year    Marital Status: Married   Family History  Problem Relation Age of Onset   Anxiety disorder Mother    Cancer Mother        melanoma    Miscarriages / Stillbirths Mother    Asthma Brother    Arthritis Maternal Grandmother    Cancer Maternal Grandfather    Heart attack Maternal Grandfather    No Known Allergies Current Outpatient Medications  Medication Sig Dispense Refill   acetaminophen (TYLENOL) 500 MG tablet Take 1 tablet (500 mg total) by mouth every 8 (eight) hours for 10 days. 30 tablet 0   aspirin EC 325 MG tablet Take 1 tablet (325 mg total) by mouth daily. 30 tablet 0   ibuprofen (ADVIL) 800 MG tablet Take 1 tablet (800 mg total) by mouth every 8 (eight) hours for 10 days. Please take with food, please alternate with acetaminophen 30 tablet 0   oxycodone (OXY-IR) 5 MG capsule Take 1 capsule (5 mg total) by mouth every 4 (four) hours as needed (severe pain). 20 capsule 0   Ascorbic Acid (VITAMIN C) 500 MG CAPS Take by mouth.     Cholecalciferol (VITAMIN D3) 125 MCG (5000 UT) TABS Take by mouth.     cyanocobalamin (VITAMIN B12) 500 MCG tablet Take 500 mcg by mouth daily.     escitalopram (LEXAPRO) 10 MG tablet TAKE 1 TABLET(10 MG) BY MOUTH DAILY 90 tablet 2   fexofenadine (ALLEGRA) 180 MG tablet Take 180 mg by mouth daily.     No current facility-administered medications for this visit.   No results found.  Review of Systems:   A ROS was performed including pertinent positives and negatives as documented in the HPI.  Physical Exam :   Constitutional: NAD and appears stated age Neurological: Alert and oriented Psych: Appropriate affect and cooperative There were no vitals taken for this visit.   Comprehensive Musculoskeletal Exam:    Musculoskeletal Exam    Inspection Right Left  Skin No atrophy or winging No atrophy or winging  Palpation    Tenderness None Subscapularis  Range of Motion    Flexion (passive) 170 170  Flexion (active) 170 170  Abduction 170 170  ER at the side 70 70  Can reach behind back to T12 L3  Strength     Full Significantly positive belly press test with weakness with liftoff  behind the back  Special Tests    Pseudoparalytic No No  Neurologic    Fires PIN, radial, median, ulnar, musculocutaneous, axillary, suprascapular, long thoracic, and spinal accessory innervated muscles. No abnormal sensibility  Vascular/Lymphatic    Radial Pulse 2+ 2+  Cervical Exam    Patient has symmetric cervical range of motion with negative Spurling's test.  Special Test: There is palpable click in  the anterior aspect of the shoulder with range of motion overlying the subscapularis, there is some tenderness over the biceps with speeds maneuver     Imaging:   Xray (3 views left shoulder): There is a large focus of heterotopic ossification below the coracoid as viewed on the axillary view  MRI (left shoulder MRI): Again a large focus of ossification is visualized underneath the subscapularis tendon.  There does not appear to be stretching and tearing of the subscapularis with some subluxation of the biceps tendon.  I personally reviewed and interpreted the radiographs.   Assessment:   36 y.o. 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  I did discuss treatment options with him.  At this time his mechanical symptoms continue to be quite bothersome for him.  He does have noticeable weakness in the setting of a tear of the subscapularis which is likely been going on for quite some time.  I did discuss possible treatment options.  At this time I do not feel an injection would benefit him significantly as this would not remove his focus of heterotopic ossification.  I did discuss that a formal rehab protocol could provide some benefit although that being said he does continue to experience significant mechanical symptoms associated with area of heterotopic ossification so I am less hopeful that physical therapy would provide any type of meaningful difference as his previous home exercise rotator cuff  programs have led to aggravation of the symptoms.  We did also discuss the possibility of injection although again I do not believe that this would make any meaningful change around the area of his heterotopic ossification.  At this time given his young age and desire to stay active, I do believe that he would benefit from a surgery to remove his focus of heterotopic ossification.  I would plan to send this for pathology as well.  I would plan to repair the subscapularis tendon in an open fashion at that time.  We did discuss that he does also have some biceps symptoms as well.  There is evidence of vital sign of subluxation on the MRI.  As result I do believe that he would benefit from a biceps tenodesis at the time of open subscapularis repair.  After discussion of his options at some length.  He has opted for surgical intervention.  Plan :    -Plan for left shoulder arthroscopy with mini open subscapularis tendon repair and biceps tenodesis   After a lengthy discussion of treatment options, including risks, benefits, alternatives, complications of surgical and nonsurgical conservative options, the patient elected surgical repair.   The patient  is aware of the material risks  and complications including, but not limited to injury to adjacent structures, neurovascular injury, infection, numbness, bleeding, implant failure, thermal burns, stiffness, persistent pain, failure to heal, disease transmission from allograft, need for further surgery, dislocation, anesthetic risks, blood clots, risks of death,and others. The probabilities of surgical success and failure discussed with patient given their particular co-morbidities.The time and nature of expected rehabilitation and recovery was discussed.The patient's questions were all answered preoperatively.  No barriers to understanding were noted. I explained the natural history of the disease process and Rx rationale.  I explained to the patient what I  considered to be reasonable expectations given their personal situation.  The final treatment plan was arrived at through a shared patient decision making process model.    I personally saw and  evaluated the patient, and participated in the management and treatment plan.  Vanetta Mulders, MD Attending Physician, Orthopedic Surgery  This document was dictated using Dragon voice recognition software. A reasonable attempt at proof reading has been made to minimize errors.

## 2022-03-10 NOTE — Anesthesia Preprocedure Evaluation (Signed)
Anesthesia Evaluation  Patient identified by MRN, date of birth, ID band Patient awake    Reviewed: Allergy & Precautions, NPO status , Patient's Chart, lab work & pertinent test results  Airway Mallampati: II  TM Distance: >3 FB Neck ROM: Full    Dental no notable dental hx.    Pulmonary neg pulmonary ROS, Patient abstained from smoking.,    Pulmonary exam normal        Cardiovascular negative cardio ROS Normal cardiovascular exam     Neuro/Psych PSYCHIATRIC DISORDERS Anxiety negative neurological ROS     GI/Hepatic negative GI ROS, Neg liver ROS,   Endo/Other  negative endocrine ROS  Renal/GU negative Renal ROS     Musculoskeletal negative musculoskeletal ROS (+)   Abdominal   Peds  Hematology negative hematology ROS (+)   Anesthesia Other Findings LEFT SHOULDER HETEROTOPIC OSSIFICATION  Reproductive/Obstetrics                            Anesthesia Physical Anesthesia Plan  ASA: 2  Anesthesia Plan: General and Regional   Post-op Pain Management: Regional block*   Induction: Intravenous  PONV Risk Score and Plan: 2 and Ondansetron, Dexamethasone, Midazolam and Treatment may vary due to age or medical condition  Airway Management Planned: Oral ETT  Additional Equipment:   Intra-op Plan:   Post-operative Plan: Extubation in OR  Informed Consent: I have reviewed the patients History and Physical, chart, labs and discussed the procedure including the risks, benefits and alternatives for the proposed anesthesia with the patient or authorized representative who has indicated his/her understanding and acceptance.     Dental advisory given  Plan Discussed with: CRNA  Anesthesia Plan Comments:         Anesthesia Quick Evaluation

## 2022-03-11 ENCOUNTER — Other Ambulatory Visit: Payer: Self-pay

## 2022-03-11 ENCOUNTER — Ambulatory Visit (HOSPITAL_BASED_OUTPATIENT_CLINIC_OR_DEPARTMENT_OTHER): Payer: Managed Care, Other (non HMO) | Admitting: Anesthesiology

## 2022-03-11 ENCOUNTER — Encounter (HOSPITAL_BASED_OUTPATIENT_CLINIC_OR_DEPARTMENT_OTHER): Payer: Self-pay | Admitting: Orthopaedic Surgery

## 2022-03-11 ENCOUNTER — Encounter (HOSPITAL_BASED_OUTPATIENT_CLINIC_OR_DEPARTMENT_OTHER)
Admission: RE | Disposition: A | Discharge status: Home / Self Care | Payer: Self-pay | Source: Ambulatory Visit | Attending: Orthopaedic Surgery

## 2022-03-11 ENCOUNTER — Ambulatory Visit (HOSPITAL_BASED_OUTPATIENT_CLINIC_OR_DEPARTMENT_OTHER)
Admission: RE | Admit: 2022-03-11 | Discharge: 2022-03-11 | Disposition: A | Discharge status: Home / Self Care | Payer: Managed Care, Other (non HMO) | Source: Ambulatory Visit | Attending: Orthopaedic Surgery | Admitting: Orthopaedic Surgery

## 2022-03-11 DIAGNOSIS — Z87891 Personal history of nicotine dependence: Secondary | ICD-10-CM | POA: Diagnosis not present

## 2022-03-11 DIAGNOSIS — S46892A Other injury of other muscles, fascia and tendons at shoulder and upper arm level, left arm, initial encounter: Secondary | ICD-10-CM | POA: Diagnosis not present

## 2022-03-11 DIAGNOSIS — S46012A Strain of muscle(s) and tendon(s) of the rotator cuff of left shoulder, initial encounter: Secondary | ICD-10-CM

## 2022-03-11 DIAGNOSIS — M24012 Loose body in left shoulder: Secondary | ICD-10-CM | POA: Insufficient documentation

## 2022-03-11 DIAGNOSIS — S43432A Superior glenoid labrum lesion of left shoulder, initial encounter: Secondary | ICD-10-CM | POA: Diagnosis present

## 2022-03-11 DIAGNOSIS — X58XXXA Exposure to other specified factors, initial encounter: Secondary | ICD-10-CM | POA: Insufficient documentation

## 2022-03-11 DIAGNOSIS — F419 Anxiety disorder, unspecified: Secondary | ICD-10-CM | POA: Insufficient documentation

## 2022-03-11 DIAGNOSIS — M61512 Other ossification of muscle, left shoulder: Secondary | ICD-10-CM | POA: Diagnosis not present

## 2022-03-11 DIAGNOSIS — S43002A Unspecified subluxation of left shoulder joint, initial encounter: Secondary | ICD-10-CM | POA: Diagnosis not present

## 2022-03-11 HISTORY — PX: SHOULDER ARTHROSCOPY WITH DEBRIDEMENT AND BICEP TENDON REPAIR: SHX5690

## 2022-03-11 SURGERY — SHOULDER ARTHROSCOPY WITH DEBRIDEMENT AND BICEP TENDON REPAIR
Anesthesia: Regional | Site: Shoulder | Laterality: Left

## 2022-03-11 MED ORDER — BUPIVACAINE-EPINEPHRINE (PF) 0.25% -1:200000 IJ SOLN
INTRAMUSCULAR | Status: AC
  Filled 2022-03-11: qty 30

## 2022-03-11 MED ORDER — DEXAMETHASONE SODIUM PHOSPHATE 4 MG/ML IJ SOLN
INTRAMUSCULAR | Status: DC | PRN
  Administered 2022-03-11: 5 mg via INTRAVENOUS

## 2022-03-11 MED ORDER — PROPOFOL 10 MG/ML IV BOLUS
INTRAVENOUS | Status: AC
  Filled 2022-03-11: qty 20

## 2022-03-11 MED ORDER — ACETAMINOPHEN 500 MG PO TABS
1000.0000 mg | ORAL_TABLET | Freq: Once | ORAL | Status: AC
  Administered 2022-03-11: 1000 mg via ORAL

## 2022-03-11 MED ORDER — GABAPENTIN 300 MG PO CAPS
300.0000 mg | ORAL_CAPSULE | Freq: Once | ORAL | Status: AC
  Administered 2022-03-11: 300 mg via ORAL

## 2022-03-11 MED ORDER — PHENYLEPHRINE HCL (PRESSORS) 10 MG/ML IV SOLN
INTRAVENOUS | Status: AC
  Filled 2022-03-11: qty 1

## 2022-03-11 MED ORDER — GABAPENTIN 300 MG PO CAPS
ORAL_CAPSULE | ORAL | Status: AC
  Filled 2022-03-11: qty 1

## 2022-03-11 MED ORDER — ROCURONIUM BROMIDE 100 MG/10ML IV SOLN
INTRAVENOUS | Status: DC | PRN
  Administered 2022-03-11: 70 mg via INTRAVENOUS

## 2022-03-11 MED ORDER — PHENYLEPHRINE HCL (PRESSORS) 10 MG/ML IV SOLN
INTRAVENOUS | Status: DC | PRN
  Administered 2022-03-11 (×2): 160 ug via INTRAVENOUS
  Administered 2022-03-11 (×2): 80 ug via INTRAVENOUS

## 2022-03-11 MED ORDER — ONDANSETRON HCL 4 MG/2ML IJ SOLN
INTRAMUSCULAR | Status: AC
  Filled 2022-03-11: qty 2

## 2022-03-11 MED ORDER — FENTANYL CITRATE (PF) 100 MCG/2ML IJ SOLN
INTRAMUSCULAR | Status: AC
  Filled 2022-03-11: qty 2

## 2022-03-11 MED ORDER — ONDANSETRON HCL 4 MG/2ML IJ SOLN
INTRAMUSCULAR | Status: DC | PRN
  Administered 2022-03-11: 4 mg via INTRAVENOUS

## 2022-03-11 MED ORDER — PROMETHAZINE HCL 25 MG/ML IJ SOLN: 6.2500 mg | INTRAMUSCULAR | Status: DC | PRN

## 2022-03-11 MED ORDER — MIDAZOLAM HCL 2 MG/2ML IJ SOLN
2.0000 mg | Freq: Once | INTRAMUSCULAR | Status: AC
  Administered 2022-03-11: 2 mg via INTRAVENOUS

## 2022-03-11 MED ORDER — ACETAMINOPHEN 500 MG PO TABS
ORAL_TABLET | ORAL | Status: AC
  Filled 2022-03-11: qty 2

## 2022-03-11 MED ORDER — MIDAZOLAM HCL 2 MG/2ML IJ SOLN
INTRAMUSCULAR | Status: AC
  Filled 2022-03-11: qty 2

## 2022-03-11 MED ORDER — SODIUM CHLORIDE 0.9 % IR SOLN
Status: DC | PRN
  Administered 2022-03-11: 6000 mL

## 2022-03-11 MED ORDER — BUPIVACAINE HCL (PF) 0.5 % IJ SOLN
INTRAMUSCULAR | Status: DC | PRN
  Administered 2022-03-11: 15 mL via PERINEURAL

## 2022-03-11 MED ORDER — OXYCODONE HCL 5 MG/5ML PO SOLN: 5.0000 mg | Freq: Once | ORAL | Status: DC | PRN

## 2022-03-11 MED ORDER — OXYCODONE HCL 5 MG PO TABS: 5.0000 mg | ORAL_TABLET | Freq: Once | ORAL | Status: DC | PRN

## 2022-03-11 MED ORDER — TRANEXAMIC ACID-NACL 1000-0.7 MG/100ML-% IV SOLN
INTRAVENOUS | Status: AC
  Filled 2022-03-11: qty 100

## 2022-03-11 MED ORDER — SODIUM CHLORIDE 0.9 % IR SOLN
Status: DC | PRN
  Administered 2022-03-11: 15000 mL

## 2022-03-11 MED ORDER — LACTATED RINGERS IV SOLN: INTRAVENOUS | Status: DC

## 2022-03-11 MED ORDER — CEFAZOLIN SODIUM-DEXTROSE 2-4 GM/100ML-% IV SOLN
2.0000 g | INTRAVENOUS | Status: AC
  Administered 2022-03-11: 2 g via INTRAVENOUS

## 2022-03-11 MED ORDER — FENTANYL CITRATE (PF) 100 MCG/2ML IJ SOLN
INTRAMUSCULAR | Status: DC | PRN
  Administered 2022-03-11: 100 ug via INTRAVENOUS

## 2022-03-11 MED ORDER — EPINEPHRINE PF 1 MG/ML IJ SOLN
INTRAMUSCULAR | Status: AC
  Filled 2022-03-11: qty 2

## 2022-03-11 MED ORDER — PROPOFOL 10 MG/ML IV BOLUS
INTRAVENOUS | Status: DC | PRN
  Administered 2022-03-11: 180 mg via INTRAVENOUS

## 2022-03-11 MED ORDER — KETOROLAC TROMETHAMINE 30 MG/ML IJ SOLN: 30.0000 mg | Freq: Once | INTRAMUSCULAR | Status: DC | PRN

## 2022-03-11 MED ORDER — GLYCOPYRROLATE 0.2 MG/ML IJ SOLN
INTRAMUSCULAR | Status: DC | PRN
  Administered 2022-03-11: .2 mg via INTRAVENOUS

## 2022-03-11 MED ORDER — TRANEXAMIC ACID-NACL 1000-0.7 MG/100ML-% IV SOLN
1000.0000 mg | INTRAVENOUS | Status: AC
  Administered 2022-03-11: 1000 mg via INTRAVENOUS

## 2022-03-11 MED ORDER — BUPIVACAINE LIPOSOME 1.3 % IJ SUSP
INTRAMUSCULAR | Status: DC | PRN
  Administered 2022-03-11: 10 mL via PERINEURAL

## 2022-03-11 MED ORDER — SUGAMMADEX SODIUM 500 MG/5ML IV SOLN
INTRAVENOUS | Status: DC | PRN
  Administered 2022-03-11: 300 mg via INTRAVENOUS

## 2022-03-11 MED ORDER — BUPIVACAINE HCL (PF) 0.25 % IJ SOLN
INTRAMUSCULAR | Status: AC
  Filled 2022-03-11: qty 30

## 2022-03-11 MED ORDER — ROCURONIUM BROMIDE 10 MG/ML (PF) SYRINGE
PREFILLED_SYRINGE | INTRAVENOUS | Status: AC
  Filled 2022-03-11: qty 10

## 2022-03-11 MED ORDER — LIDOCAINE 2% (20 MG/ML) 5 ML SYRINGE
INTRAMUSCULAR | Status: AC
  Filled 2022-03-11: qty 5

## 2022-03-11 MED ORDER — PHENYLEPHRINE HCL-NACL 20-0.9 MG/250ML-% IV SOLN
INTRAVENOUS | Status: DC | PRN
  Administered 2022-03-11: 50 ug/min via INTRAVENOUS

## 2022-03-11 MED ORDER — DEXAMETHASONE SODIUM PHOSPHATE 10 MG/ML IJ SOLN
INTRAMUSCULAR | Status: AC
  Filled 2022-03-11: qty 1

## 2022-03-11 MED ORDER — FENTANYL CITRATE (PF) 100 MCG/2ML IJ SOLN: 25.0000 ug | INTRAMUSCULAR | Status: DC | PRN

## 2022-03-11 MED ORDER — AMISULPRIDE (ANTIEMETIC) 5 MG/2ML IV SOLN
10.0000 mg | Freq: Once | INTRAVENOUS | Status: DC | PRN
Start: 2022-03-11 — End: 2022-03-11

## 2022-03-11 MED ORDER — CEFAZOLIN SODIUM-DEXTROSE 2-4 GM/100ML-% IV SOLN
INTRAVENOUS | Status: AC
  Filled 2022-03-11: qty 100

## 2022-03-11 SURGICAL SUPPLY — 86 items
AID PSTN UNV HD RSTRNT DISP (MISCELLANEOUS) ×1
ANCH SUT 2 SWLK 19.1 CLS EYLT (Anchor) ×1 IMPLANT
ANCHOR SWIVELOCK BIO 4.75X19.1 (Anchor) IMPLANT
APL PRP STRL LF DISP 70% ISPRP (MISCELLANEOUS) ×2
APL SKNCLS STERI-STRIP NONHPOA (GAUZE/BANDAGES/DRESSINGS)
BENZOIN TINCTURE PRP APPL 2/3 (GAUZE/BANDAGES/DRESSINGS) IMPLANT
BLADE EXCALIBUR 4.0X13 (MISCELLANEOUS) ×1 IMPLANT
BLADE SURG 15 STRL LF DISP TIS (BLADE) IMPLANT
BLADE SURG 15 STRL SS (BLADE) ×1
BURR OVAL 8 FLU 4.0X13 (MISCELLANEOUS) IMPLANT
CANNULA 5.75X71 LONG (CANNULA) IMPLANT
CANNULA 7X7 TWIST-IN (CANNULA) IMPLANT
CANNULA PASSPORT 5 (CANNULA) IMPLANT
CANNULA PASSPORT BUTTON 10-40 (CANNULA) IMPLANT
CANNULA TWIST IN 8.25X7CM (CANNULA) IMPLANT
CHLORAPREP W/TINT 26 (MISCELLANEOUS) ×2 IMPLANT
CLSR STERI-STRIP ANTIMIC 1/2X4 (GAUZE/BANDAGES/DRESSINGS) IMPLANT
COOLER ICEMAN CLASSIC (MISCELLANEOUS) ×1 IMPLANT
DRAPE IMP U-DRAPE 54X76 (DRAPES) ×1 IMPLANT
DRAPE INCISE IOBAN 66X45 STRL (DRAPES) ×1 IMPLANT
DRAPE SHOULDER BEACH CHAIR (DRAPES) ×1 IMPLANT
DRAPE U-SHAPE 47X51 STRL (DRAPES) ×2 IMPLANT
DRSG PAD ABDOMINAL 8X10 ST (GAUZE/BANDAGES/DRESSINGS) ×1 IMPLANT
DW OUTFLOW CASSETTE/TUBE SET (MISCELLANEOUS) ×1 IMPLANT
ELECT REM PT RETURN 9FT ADLT (ELECTROSURGICAL) ×1
ELECTRODE REM PT RTRN 9FT ADLT (ELECTROSURGICAL) ×1 IMPLANT
GAUZE SPONGE 4X4 12PLY STRL (GAUZE/BANDAGES/DRESSINGS) ×1 IMPLANT
GAUZE XEROFORM 1X8 LF (GAUZE/BANDAGES/DRESSINGS) ×1 IMPLANT
GLOVE BIO SURGEON STRL SZ 6 (GLOVE) ×2 IMPLANT
GLOVE BIO SURGEON STRL SZ7.5 (GLOVE) ×1 IMPLANT
GLOVE BIOGEL PI IND STRL 6.5 (GLOVE) ×1 IMPLANT
GLOVE BIOGEL PI IND STRL 7.0 (GLOVE) IMPLANT
GLOVE BIOGEL PI IND STRL 8 (GLOVE) ×1 IMPLANT
GLOVE BIOGEL PI INDICATOR 6.5 (GLOVE) ×1
GLOVE BIOGEL PI INDICATOR 7.0 (GLOVE) ×3
GLOVE BIOGEL PI INDICATOR 8 (GLOVE) ×1
GLOVE ECLIPSE 6.5 STRL STRAW (GLOVE) IMPLANT
GLOVE ECLIPSE 8.0 STRL XLNG CF (GLOVE) ×1 IMPLANT
GLOVE SURG SS PI 6.5 STRL IVOR (GLOVE) IMPLANT
GLOVE SURG SS PI 7.0 STRL IVOR (GLOVE) IMPLANT
GLOVE SURG SYN 7.5  E (GLOVE) ×1
GLOVE SURG SYN 7.5 E (GLOVE) ×1 IMPLANT
GLOVE SURG SYN 7.5 PF PI (GLOVE) ×1 IMPLANT
GOWN STRL REUS W/ TWL LRG LVL3 (GOWN DISPOSABLE) ×2 IMPLANT
GOWN STRL REUS W/ TWL XL LVL3 (GOWN DISPOSABLE) ×1 IMPLANT
GOWN STRL REUS W/TWL LRG LVL3 (GOWN DISPOSABLE) ×3
GOWN STRL REUS W/TWL XL LVL3 (GOWN DISPOSABLE) ×2 IMPLANT
KIT SHOULDER STAB MARCO (KITS) ×1 IMPLANT
KIT STR SPEAR 1.8 FBRTK DISP (KITS) IMPLANT
LASSO 90 CVE QUICKPAS (DISPOSABLE) IMPLANT
LASSO CRESCENT QUICKPASS (SUTURE) IMPLANT
MANIFOLD NEPTUNE II (INSTRUMENTS) ×1 IMPLANT
NDL SAFETY ECLIPSE 18X1.5 (NEEDLE) ×1 IMPLANT
NDL SCORPION MULTI FIRE (NEEDLE) IMPLANT
NEEDLE HYPO 18GX1.5 SHARP (NEEDLE) ×1
NEEDLE SCORPION MULTI FIRE (NEEDLE) ×1 IMPLANT
PACK ARTHROSCOPY DSU (CUSTOM PROCEDURE TRAY) ×1 IMPLANT
PACK BASIN DAY SURGERY FS (CUSTOM PROCEDURE TRAY) ×1 IMPLANT
PAD COLD SHLDR WRAP-ON (PAD) ×1 IMPLANT
PENCIL SMOKE EVACUATOR (MISCELLANEOUS) IMPLANT
PORT APPOLLO RF 90DEGREE MULTI (SURGICAL WAND) ×1 IMPLANT
RESTRAINT HEAD UNIVERSAL NS (MISCELLANEOUS) ×1 IMPLANT
SHEET MEDIUM DRAPE 40X70 STRL (DRAPES) IMPLANT
SLEEVE SCD COMPRESS KNEE MED (STOCKING) ×1 IMPLANT
SPONGE T-LAP 4X18 ~~LOC~~+RFID (SPONGE) ×1 IMPLANT
SUT ETHILON 3 0 PS 1 (SUTURE) ×1 IMPLANT
SUT FIBERWIRE #2 38 T-5 BLUE (SUTURE)
SUT MON AB 4-0 PS1 27 (SUTURE) IMPLANT
SUT PDS AB 1 CT  36 (SUTURE)
SUT PDS AB 1 CT 36 (SUTURE) IMPLANT
SUT TIGER TAPE 7 IN WHITE (SUTURE) IMPLANT
SUT VIC AB 0 CT1 27 (SUTURE)
SUT VIC AB 0 CT1 27XBRD ANBCTR (SUTURE) IMPLANT
SUT VIC AB 2-0 SH 27 (SUTURE)
SUT VIC AB 2-0 SH 27XBRD (SUTURE) IMPLANT
SUTURE FIBERWR #2 38 T-5 BLUE (SUTURE) IMPLANT
SUTURE TAPE 1.3 40 TPR END (SUTURE) IMPLANT
SUTURE TAPE TIGERLINK 1.3MM BL (SUTURE) IMPLANT
SUTURETAPE 1.3 40 TPR END (SUTURE)
SUTURETAPE TIGERLINK 1.3MM BL (SUTURE) ×4
SYR 5ML LL (SYRINGE) ×1 IMPLANT
TAPE FIBER 2MM 7IN #2 BLUE (SUTURE) IMPLANT
TOWEL GREEN STERILE FF (TOWEL DISPOSABLE) ×2 IMPLANT
TUBE CONNECTING 20X1/4 (TUBING) ×1 IMPLANT
TUBING ARTHROSCOPY IRRIG 16FT (MISCELLANEOUS) ×1 IMPLANT
YANKAUER SUCT BULB TIP NO VENT (SUCTIONS) IMPLANT

## 2022-03-11 NOTE — Anesthesia Postprocedure Evaluation (Signed)
Anesthesia Post Note  Patient: Kent Johnson  Procedure(s) Performed: LEFT SHOULDER ARTHROSCOPY WITH MINI OPEN SUBSCAPULARIS REPAIR  AND BICEP TENDODESIS (Left: Shoulder)     Patient location during evaluation: PACU Anesthesia Type: Regional and General Level of consciousness: awake Pain management: pain level controlled Vital Signs Assessment: post-procedure vital signs reviewed and stable Respiratory status: spontaneous breathing, nonlabored ventilation, respiratory function stable and patient connected to nasal cannula oxygen Cardiovascular status: blood pressure returned to baseline and stable Postop Assessment: no apparent nausea or vomiting Anesthetic complications: no   No notable events documented.  Last Vitals:  Vitals:   03/11/22 0954 03/11/22 1022  BP: 127/85 126/78  Pulse: 84 80  Resp: 14 16  Temp:  36.4 C  SpO2: 95% 96%    Last Pain:  Vitals:   03/11/22 1022  PainSc: 0-No pain                 Laiyah Exline P Lizania Bouchard

## 2022-03-11 NOTE — Interval H&P Note (Signed)
History and Physical Interval Note:  03/11/2022 7:22 AM  Kent Johnson  has presented today for surgery, with the diagnosis of LEFT SHOULDER HETEROTOPIC OSSIFICATION.  The various methods of treatment have been discussed with the patient and family. After consideration of risks, benefits and other options for treatment, the patient has consented to  Procedure(s): LEFT SHOULDER ARTHROSCOPY WITH MINI OPEN SUBSCAPULANS REPAIR  AND BICEP TENDODESIS (Left) as a surgical intervention.  The patient's history has been reviewed, patient examined, no change in status, stable for surgery.  I have reviewed the patient's chart and labs.  Questions were answered to the patient's satisfaction.     Huel Cote

## 2022-03-11 NOTE — Brief Op Note (Deleted)
Date of Surgery: 03/11/2022  INDICATIONS: Kent Johnson is a 36 y.o.-year-old male with a left shoulder subscapularis tear as well as intra-articular loose body who has elected for operative repair.  The risk and benefits of the procedure were discussed in detail and documented in the pre-operative evaluation.   PREOPERATIVE DIAGNOSES: Left shoulder subscapularis tear with subluxed biceps and intra-articular loose body  POSTOPERATIVE DIAGNOSIS: Same.  PROCEDURE: Arthroscopic limited debridement - 78242 Arthroscopic rotator cuff repair - 35361 Arthroscopic biceps tenodesis - 44315  SURGEON: Kent Deeds MD  ASSISTANT: Kent Johnson, ATC  ANESTHESIA:  general interscalene nerve block  IV FLUIDS AND URINE: See anesthesia record.  ANTIBIOTICS: Ancef  ESTIMATED BLOOD LOSS: 10 mL.  IMPLANTS:  Implant Name Type Inv. Item Serial No. Manufacturer Lot No. LRB No. Used Action  Kent Johnson BIO [phone] - [id number] Anchor ANCHOR SWIVELOCK BIO 4.75X19.1  [employer] [id number] Left 1 Implanted    DRAINS: None  CULTURES: None  COMPLICATIONS: none  PROCEDURE:    OPERATIVE FINDING: Exam under anesthesia:   Examination under anesthesia revealed forward elevation of 150 degrees.  With the arm at the side, there was 65 degrees of external rotation.  There is a 1+ anterior load shift and a 1+ posterior load shift.    Arthroscopic findings demonstrated: Articular space: Fraying of the anterior and superior labrum Chondral surfaces: Normal Biceps: Subluxation medially Subscapularis: Significant tear with retraction involving the superior 50% Supraspinatus: Intact Infraspinatus: Intact    I identified the patient in the pre-operative holding area.  I marked the operative right shoulder with my initials. I reviewed the risks and benefits of the proposed surgical intervention and the patient wished to proceed.  Anesthesia was then performed with regional block.  The patient was  transferred to the operative suite and placed in the beach chair position with all bony prominences padded.     SCDs were placed on bilateral lower extremity. Appropriate antibiotics was administered within 1 hour before incision.  Anesthesia was induced.  The operative extremity was then prepped and draped in standard fashion. A time out was performed confirming the correct extremity, correct patient and correct procedure.   The arthroscope was introduced in the glenohumeral joint from a posterior portal.  An anterior portal was created.  The shoulder was examined and the above findings were noted.     With an arthroscopic shaver and a wand ablator, synovitis throughout the  shoulder was resected.  The arthroscopic shaver was used to excise torn portions of the labrum back to a stable margin. Specifically this was done for the anterior superior and superior labrum.  At this time attention was turned to the anterior shoulder loose body.  A separate anterior lateral portal was created and a pituitary was used to grasp onto the loose body.  This was encased in the subscapularis tendon.  Using an electrocautery device and the anterior portal this was shelled out of the tendon.  The anterior portal was enlarged and this was removed from the shoulder in 1 piece.  At this time the biceps was tagged with a self passing device with a #2 nonabsorbable suture.  This was done twice and wrapped around the biceps in order to create a ripstop type locking stitch from the biceps.  This was taken out through the anterior lateral portal.  At this time it was well visualized that there was a large portion of the lesser tuberosity that was missing.  The remaining portion of the subscapularis  tendon was then captured using 2 fiber link sutures.  These were all brought into the same anchor as the biceps tendon.  This was inserted into the bed of the previous donor fracture area.  This resulted in reapproximation of the  subscapularis tendon and biceps into the groove.  The shoulder was irrigated.  The arthroscopic instruments were removed.  Wounds were closed with 3-0 nylon sutures.  A sterile dressing was applied with xeroform, 4x8s, abdominal pad, and tape. An Iceman was placed and the upper extremity was placed in a shoulder immobilizer.  The patient tolerated the procedure well and was taken to the recovery room in stable condition.  All counts were correct in the case. The patient tolerated the procedure well and was taken to the recovery room in stable condition.    POSTOPERATIVE PLAN: He will be nonweightbearing in a sling in the left arm.  He will begin physical therapy with subscapularis repair protocol.  He will be placed on aspirin for blood clot prevention.  I will see him back in 2 weeks for wound check.  Kent Jeffries L Abhijay Morriss, MD 9:31 AM 

## 2022-03-11 NOTE — Brief Op Note (Signed)
   Brief Op Note  Date of Surgery: 03/11/2022  Preoperative Diagnosis: LEFT SHOULDER HETEROTOPIC OSSIFICATION  Postoperative Diagnosis: same  Procedure: Procedure(s): LEFT SHOULDER ARTHROSCOPY WITH MINI OPEN SUBSCAPULARIS REPAIR  AND BICEP TENDODESIS  Implants: Implant Name Type Inv. Item Serial No. Manufacturer Lot No. LRB No. Used Action  Veronia Beets BIO [phone] - [id number] Anchor ANCHOR SWIVELOCK BIO 4.75X19.1  Marc[employer] Bal [id number] Left 1 Implanted    Surgeons: Surgeon(s): Huel Cote, MD  Anesthesia: Regional    Estimated Blood Loss: See anesthesia record  Complications: None  Condition to PACU: Stable  Benancio Deeds, MD 03/11/2022 9:28 AM

## 2022-03-11 NOTE — Op Note (Signed)
Date of Surgery: 03/11/2022  INDICATIONS: [name] is a 36 y.o.-year-old male with a left shoulder subscapularis tear as well as intra-articular loose body who has elected for operative repair.  The risk and benefits of the procedure were discussed in detail and documented in the pre-operative evaluation.   PREOPERATIVE DIAGNOSES: Left shoulder subscapularis tear with subluxed biceps and intra-articular loose body  POSTOPERATIVE DIAGNOSIS: Same.  PROCEDURE: Arthroscopic limited debridement - 78242 Arthroscopic rotator cuff repair - 35361 Arthroscopic biceps tenodesis - 44315  SURGEON: Benancio Deeds MD  ASSISTANT: Kerby Less, ATC  ANESTHESIA:  general interscalene nerve block  IV FLUIDS AND URINE: See anesthesia record.  ANTIBIOTICS: Ancef  ESTIMATED BLOOD LOSS: 10 mL.  IMPLANTS:  Implant Name Type Inv. Item Serial No. Manufacturer Lot No. LRB No. Used Action  Veronia Beets BIO [phone] - [id number] Anchor ANCHOR SWIVELOCK BIO 4.75X19.1  [employer] [id number] Left 1 Implanted    DRAINS: None  CULTURES: None  COMPLICATIONS: none  PROCEDURE:    OPERATIVE FINDING: Exam under anesthesia:   Examination under anesthesia revealed forward elevation of 150 degrees.  With the arm at the side, there was 65 degrees of external rotation.  There is a 1+ anterior load shift and a 1+ posterior load shift.    Arthroscopic findings demonstrated: Articular space: Fraying of the anterior and superior labrum Chondral surfaces: Normal Biceps: Subluxation medially Subscapularis: Significant tear with retraction involving the superior 50% Supraspinatus: Intact Infraspinatus: Intact    I identified the patient in the pre-operative holding area.  I marked the operative right shoulder with my initials. I reviewed the risks and benefits of the proposed surgical intervention and the patient wished to proceed.  Anesthesia was then performed with regional block.  The patient was  transferred to the operative suite and placed in the beach chair position with all bony prominences padded.     SCDs were placed on bilateral lower extremity. Appropriate antibiotics was administered within 1 hour before incision.  Anesthesia was induced.  The operative extremity was then prepped and draped in standard fashion. A time out was performed confirming the correct extremity, correct patient and correct procedure.   The arthroscope was introduced in the glenohumeral joint from a posterior portal.  An anterior portal was created.  The shoulder was examined and the above findings were noted.     With an arthroscopic shaver and a wand ablator, synovitis throughout the  shoulder was resected.  The arthroscopic shaver was used to excise torn portions of the labrum back to a stable margin. Specifically this was done for the anterior superior and superior labrum.  At this time attention was turned to the anterior shoulder loose body.  A separate anterior lateral portal was created and a pituitary was used to grasp onto the loose body.  This was encased in the subscapularis tendon.  Using an electrocautery device and the anterior portal this was shelled out of the tendon.  The anterior portal was enlarged and this was removed from the shoulder in 1 piece.  At this time the biceps was tagged with a self passing device with a #2 nonabsorbable suture.  This was done twice and wrapped around the biceps in order to create a ripstop type locking stitch from the biceps.  This was taken out through the anterior lateral portal.  At this time it was well visualized that there was a large portion of the lesser tuberosity that was missing.  The remaining portion of the subscapularis  tendon was then captured using 2 fiber link sutures.  These were all brought into the same anchor as the biceps tendon.  This was inserted into the bed of the previous donor fracture area.  This resulted in reapproximation of the  subscapularis tendon and biceps into the groove.  The shoulder was irrigated.  The arthroscopic instruments were removed.  Wounds were closed with 3-0 nylon sutures.  A sterile dressing was applied with xeroform, 4x8s, abdominal pad, and tape. An Flonnie Hailstone was placed and the upper extremity was placed in a shoulder immobilizer.  The patient tolerated the procedure well and was taken to the recovery room in stable condition.  All counts were correct in the case. The patient tolerated the procedure well and was taken to the recovery room in stable condition.    POSTOPERATIVE PLAN: He will be nonweightbearing in a sling in the left arm.  He will begin physical therapy with subscapularis repair protocol.  He will be placed on aspirin for blood clot prevention.  I will see him back in 2 weeks for wound check.  Benancio Deeds, MD 9:31 AM

## 2022-03-11 NOTE — Transfer of Care (Signed)
Immediate Anesthesia Transfer of Care Note  Patient: Kent Johnson  Procedure(s) Performed: LEFT SHOULDER ARTHROSCOPY WITH MINI OPEN SUBSCAPULARIS REPAIR  AND BICEP TENDODESIS (Left: Shoulder)  Patient Location: PACU  Anesthesia Type:General and Regional  Level of Consciousness: awake, alert  and oriented  Airway & Oxygen Therapy: Patient Spontanous Breathing and Patient connected to face mask oxygen  Post-op Assessment: Report given to RN and Post -op Vital signs reviewed and stable  Post vital signs: Reviewed and stable  Last Vitals:  Vitals Value Taken Time  BP    Temp    Pulse    Resp    SpO2      Last Pain:  Vitals:   03/11/22 0628  PainSc: 0-No pain      Patients Stated Pain Goal: 4 (03/11/22 5909)  Complications: No notable events documented.

## 2022-03-11 NOTE — Anesthesia Procedure Notes (Signed)
Anesthesia Regional Block: Interscalene brachial plexus block   Pre-Anesthetic Checklist: , timeout performed,  Correct Patient, Correct Site, Correct Laterality,  Correct Procedure, Correct Position, site marked,  Risks and benefits discussed,  Surgical consent,  Pre-op evaluation,  At surgeon's request and post-op pain management  Laterality: Left  Prep: chloraprep       Needles:  Injection technique: Single-shot  Needle Type: Echogenic Stimulator Needle     Needle Length: 9cm  Needle Gauge: 21     Additional Needles:   Procedures:,,,, ultrasound used (permanent image in chart),,    Narrative:  Start time: 03/11/2022 6:50 AM End time: 03/11/2022 7:00 AM Injection made incrementally with aspirations every 5 mL.  Performed by: Personally  Anesthesiologist: Leonides Grills, MD  Additional Notes: Functioning IV was confirmed and monitors were applied.  A timeout was performed. Sterile prep, hand hygiene and sterile gloves were used. A 56mm 21ga Arrow echogenic stimulator needle was used. Negative aspiration and negative test dose prior to incremental administration of local anesthetic. The patient tolerated the procedure well.  Ultrasound guidance: relevent anatomy identified, needle position confirmed, local anesthetic spread visualized around nerve(s), vascular puncture avoided.  Image printed for medical record.

## 2022-03-11 NOTE — H&P (Signed)
Chief Complaint: Left shoulder pain        History of Present Illness:    03/06/2022: Presents today for discussion of the left shoulder.  Remains quite limited with certain activities that are painful in the front of the shoulder.  Overhead activities in particular have been quite limiting for him.  He today for discussion of treatment options.   Kent Johnson is a 36 y.o. male left-hand-dominant male presents with left shoulder pain that has now been going on for several years but is worse over the last 6 months.  He has previously been seen by my partner Dr. Magnus Ivan who is referred him to me for his left shoulder.  He is very active and enjoys pickleball, tennis, golf, working out multiple times weekly, swelling.  He is most recently picked up swimming as he has had increasing knee pain and as result has attempted to modify his exercise regiment.  With this he has noticed a palpable clicking as well as pain in the anterior aspect of the left shoulder.  He has noticed weakness with certain activities like flies in the gym.  He works as a Sport and exercise psychologist.  He is hoping to get further treatment for this left shoulder as he is somewhat frustrated by the weakness and popping.  He has worked on home exercise program of the left shoulder although this is continued to be limited as result of his popping.  He has tried activity restriction although this is not preferable to him as he would like to stay active and continue swimming.  He has not had any injections.       Surgical History:   None   PMH/PSH/Family History/Social History/Meds/Allergies:         Past Medical History:  Diagnosis Date   Anxiety           Past Surgical History:  Procedure Laterality Date   CLAVICLE SURGERY   2011    broke collar bone skiing   LIPOMA EXCISION   2007    benign lipoma in colon, caused intersepcion    Social History         Socioeconomic History   Marital status:  Married      Spouse name: Fleet Contras   Number of children: Not on file   Years of education: Not on file   Highest education level: Master's degree (e.g., MA, MS, MEng, MEd, MSW, MBA)  Occupational History   Not on file  Tobacco Use   Smoking status: Never   Smokeless tobacco: Never  Vaping Use   Vaping Use: Not on file  Substance and Sexual Activity   Alcohol use: Yes      Comment: occas   Drug use: Never   Sexual activity: Yes      Partners: Female  Other Topics Concern   Not on file  Social History Narrative   Not on file    Social Determinants of Health        Financial Resource Strain: Low Risk  (12/28/2021)    Overall Financial Resource Strain (CARDIA)     Difficulty of Paying Living Expenses: Not hard at all  Food Insecurity: No Food Insecurity (12/28/2021)    Hunger Vital Sign     Worried About Running Out of Food in the Last Year: Never true     Ran Out of Food in the Last Year: Never true  Transportation Needs: No Transportation Needs (12/28/2021)    PRAPARE - Transportation  Lack of Transportation (Medical): No     Lack of Transportation (Non-Medical): No  Physical Activity: Insufficiently Active (12/28/2021)    Exercise Vital Sign     Days of Exercise per Week: 4 days     Minutes of Exercise per Session: 30 min  Stress: No Stress Concern Present (12/28/2021)    [employer] of Occupational Health - Occupational Stress Questionnaire     Feeling of Stress : Only a little  Social Connections: Unknown (12/28/2021)    Social Connection and Isolation Panel [NHANES]     Frequency of Communication with Friends and Family: Twice a week     Frequency of Social Gatherings with Friends and Family: Once a week     Attends Religious Services: Patient refused     Database administrator or Organizations: Yes     Attends Engineer, structural: More than 4 times per year     Marital Status: Married         Family History  Problem Relation Age of Onset   Anxiety  disorder Mother     Cancer Mother          melanoma   Miscarriages / Stillbirths Mother     Asthma Brother     Arthritis Maternal Grandmother     Cancer Maternal Grandfather     Heart attack Maternal Grandfather      No Known Allergies       Current Outpatient Medications  Medication Sig Dispense Refill   acetaminophen (TYLENOL) 500 MG tablet Take 1 tablet (500 mg total) by mouth every 8 (eight) hours for 10 days. 30 tablet 0   aspirin EC 325 MG tablet Take 1 tablet (325 mg total) by mouth daily. 30 tablet 0   ibuprofen (ADVIL) 800 MG tablet Take 1 tablet (800 mg total) by mouth every 8 (eight) hours for 10 days. Please take with food, please alternate with acetaminophen 30 tablet 0   oxycodone (OXY-IR) 5 MG capsule Take 1 capsule (5 mg total) by mouth every 4 (four) hours as needed (severe pain). 20 capsule 0   Ascorbic Acid (VITAMIN C) 500 MG CAPS Take by mouth.       Cholecalciferol (VITAMIN D3) 125 MCG (5000 UT) TABS Take by mouth.       cyanocobalamin (VITAMIN B12) 500 MCG tablet Take 500 mcg by mouth daily.       escitalopram (LEXAPRO) 10 MG tablet TAKE 1 TABLET(10 MG) BY MOUTH DAILY 90 tablet 2   fexofenadine (ALLEGRA) 180 MG tablet Take 180 mg by mouth daily.        No current facility-administered medications for this visit.    Imaging Results (Last 48 hours)  No results found.     Review of Systems:   A ROS was performed including pertinent positives and negatives as documented in the HPI.   Physical Exam :   Constitutional: NAD and appears stated age Neurological: Alert and oriented Psych: Appropriate affect and cooperative There were no vitals taken for this visit.    Comprehensive Musculoskeletal Exam:     Musculoskeletal Exam      Inspection Right Left  Skin No atrophy or winging No atrophy or winging  Palpation      Tenderness None Subscapularis  Range of Motion      Flexion (passive) 170 170  Flexion (active) 170 170  Abduction 170 170  ER at the side  70 70  Can reach behind back to T12 L3  Strength  Full Significantly positive belly press test with weakness with liftoff behind the back  Special Tests      Pseudoparalytic No No  Neurologic      Fires PIN, radial, median, ulnar, musculocutaneous, axillary, suprascapular, long thoracic, and spinal accessory innervated muscles. No abnormal sensibility  Vascular/Lymphatic      Radial Pulse 2+ 2+  Cervical Exam      Patient has symmetric cervical range of motion with negative Spurling's test.  Special Test: There is palpable click in the anterior aspect of the shoulder with range of motion overlying the subscapularis, there is some tenderness over the biceps with speeds maneuver        Imaging:   Xray (3 views left shoulder): There is a large focus of heterotopic ossification below the coracoid as viewed on the axillary view   MRI (left shoulder MRI): Again a large focus of ossification is visualized underneath the subscapularis tendon.  There does not appear to be stretching and tearing of the subscapularis with some subluxation of the biceps tendon.   I personally reviewed and interpreted the radiographs.     Assessment:   36 y.o. 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  I did discuss treatment options with him.  At this time his mechanical symptoms continue to be quite bothersome for him.  He does have noticeable weakness in the setting of a tear of the subscapularis which is likely been going on for quite some time.  I did discuss possible treatment options.  At this time I do not feel an injection would benefit him significantly as this would not remove his focus of heterotopic ossification.  I did discuss that a formal rehab protocol could provide some benefit although that being said he does continue to experience significant mechanical symptoms associated with area of heterotopic  ossification so I am less hopeful that physical therapy would provide any type of meaningful difference as his previous home exercise rotator cuff programs have led to aggravation of the symptoms.  We did also discuss the possibility of injection although again I do not believe that this would make any meaningful change around the area of his heterotopic ossification.  At this time given his young age and desire to stay active, I do believe that he would benefit from a surgery to remove his focus of heterotopic ossification.  I would plan to send this for pathology as well.  I would plan to repair the subscapularis tendon in an open fashion at that time.  We did discuss that he does also have some biceps symptoms as well.  There is evidence of biceps subluxation on the MRI.  As result I do believe that he would benefit from a biceps tenodesis at the time of open subscapularis repair.  After discussion of his options at some length.  He has opted for surgical intervention.   Plan :     -Plan for left shoulder arthroscopy with mini open subscapularis tendon repair and biceps tenodesis     After a lengthy discussion of treatment options, including risks, benefits, alternatives, complications of surgical and nonsurgical conservative options, the patient elected surgical repair.    The patient  is aware of the material risks  and complications including, but not limited to injury to adjacent structures, neurovascular injury, infection, numbness, bleeding, implant failure, thermal burns, stiffness, persistent pain, failure to heal, disease transmission from allograft, need for further surgery, dislocation, anesthetic  risks, blood clots, risks of death,and others. The probabilities of surgical success and failure discussed with patient given their particular co-morbidities.The time and nature of expected rehabilitation and recovery was discussed.The patient's questions were all answered preoperatively.  No  barriers to understanding were noted. I explained the natural history of the disease process and Rx rationale.  I explained to the patient what I considered to be reasonable expectations given their personal situation.  The final treatment plan was arrived at through a shared patient decision making process model.       I personally saw and evaluated the patient, and participated in the management and treatment plan.   Huel Cote, MD Attending Physician, Orthopedic Surgery

## 2022-03-11 NOTE — Discharge Instructions (Addendum)
Discharge Instructions    Attending Surgeon: Huel Cote, MD Office Phone Number: [phone]   Diagnosis and Procedures:    Surgeries Performed: Left shoulder removal of foreign body  Discharge Plan:    Diet: Resume usual diet. Begin with light or bland foods.  Drink plenty of fluids.  Activity:  Keep sling and dressing in place until your follow up visit in Physical Therapy You are advised to go home directly from the hospital or surgical center. Restrict your activities.  GENERAL INSTRUCTIONS: 1.  Keep your surgical site elevated above your heart for at least 5-7 days or longer to prevent swelling. This will improve your comfort and your overall recovery following surgery.     2. Please call Dr. Serena Croissant office at [phone] with questions Monday-Friday during business hours. If no one answers, please leave a message and someone should get back to the patient within 24 hours. For emergencies please call 911 or proceed to the emergency room.   3. Patient to notify surgical team if experiences any of the following: Bowel/Bladder dysfunction, uncontrolled pain, nerve/muscle weakness, incision with increased drainage or redness, nausea/vomiting and Fever greater than 101.0 F.  Be alert for signs of infection including redness, streaking, odor, fever or chills. Be alert for excessive pain or bleeding and notify your surgeon immediately.  WOUND INSTRUCTIONS:   Leave your dressing/cast/splint in place until your post operative visit.  Keep it clean and dry.  Always keep the incision clean and dry until the staples/sutures are removed. If there is no drainage from the incision you should keep it open to air. If there is drainage from the incision you must keep it covered at all times until the drainage stops  Do not soak in a bath tub, hot tub, pool, lake or other body of water until 21 days after your surgery and your incision is completely dry and healed.  If you have  removable sutures (or staples) they must be removed 10-14 days (unless otherwise instructed) from the day of your surgery.     1)  Elevate the extremity as much as possible.  2)  Keep the dressing clean and dry.  3)  Please call us if the dressing becomes wet or dirty.  4)  If you are experiencing worsening pain or worsening swelling, please call.     MEDICATIONS: Resume all previous home medications at the previous prescribed dose and frequency unless otherwise noted Start taking the  pain medications on an as-needed basis as prescribed  Please taper down pain medication over the next week following surgery.  Ideally you should not require a refill of any narcotic pain medication.  Take pain medication with food to minimize nausea. In addition to the prescribed pain medication, you may take over-the-counter pain relievers such as Tylenol.  Do NOT take additional tylenol if your pain medication already has tylenol in it.  Aspirin 325mg  daily for four weeks.      FOLLOWUP INSTRUCTIONS: 1. Follow up at the Physical Therapy Clinic 3-4 days following surgery. This appointment should be scheduled unless other arrangements have been made.The Physical Therapy scheduling number is [phone] if an appointment has not already been arranged.  2. Contact Dr. [phone] office during office hours at [phone] or the practice after hours line at [phone] for non-emergencies. For medical emergencies call 911.   Discharge Location: Home     Post Anesthesia Home Care Instructions  Activity: Get plenty of rest for the remainder of  the day. A responsible individual must stay with you for 24 hours following the procedure.  For the next 24 hours, DO NOT: -Drive a car -Advertising copywriter -Drink alcoholic beverages -Take any medication unless instructed by your physician -Make any legal decisions or sign important papers.  Meals: Start with liquid foods such as gelatin or soup. Progress  to regular foods as tolerated. Avoid greasy, spicy, heavy foods. If nausea and/or vomiting occur, drink only clear liquids until the nausea and/or vomiting subsides. Call your physician if vomiting continues.  Special Instructions/Symptoms: Your throat may feel dry or sore from the anesthesia or the breathing tube placed in your throat during surgery. If this causes discomfort, gargle with warm salt water. The discomfort should disappear within 24 hours.  If you had a scopolamine patch placed behind your ear for the management of post- operative nausea and/or vomiting:  1. The medication in the patch is effective for 72 hours, after which it should be removed.  Wrap patch in a tissue and discard in the trash. Wash hands thoroughly with soap and water. 2. You may remove the patch earlier than 72 hours if you experience unpleasant side effects which may include dry mouth, dizziness or visual disturbances. 3. Avoid touching the patch. Wash your hands with soap and water after contact with the patch.     Regional Anesthesia Blocks  1. Numbness or the inability to move the "blocked" extremity may last from 3-48 hours after placement. The length of time depends on the medication injected and your individual response to the medication. If the numbness is not going away after 48 hours, call your surgeon.  2. The extremity that is blocked will need to be protected until the numbness is gone and the  Strength has returned. Because you cannot feel it, you will need to take extra care to avoid injury. Because it may be weak, you may have difficulty moving it or using it. You may not know what position it is in without looking at it while the block is in effect.  3. For blocks in the legs and feet, returning to weight bearing and walking needs to be done carefully. You will need to wait until the numbness is entirely gone and the strength has returned. You should be able to move your leg and foot normally  before you try and bear weight or walk. You will need someone to be with you when you first try to ensure you do not fall and possibly risk injury.  4. Bruising and tenderness at the needle site are common side effects and will resolve in a few days.  5. Persistent numbness or new problems with movement should be communicated to the surgeon or the [facility] [phone] [facility] [phone]).    Information for Discharge Teaching: EXPAREL (bupivacaine liposome injectable suspension)   Your surgeon or anesthesiologist gave you EXPAREL(bupivacaine) to help control your pain after surgery.  EXPAREL is a local anesthetic that provides pain relief by numbing the tissue around the surgical site. EXPAREL is designed to release pain medication over time and can control pain for up to 72 hours. Depending on how you respond to EXPAREL, you may require less pain medication during your recovery.  Possible side effects: Temporary loss of sensation or ability to move in the area where bupivacaine was injected. Nausea, vomiting, constipation Rarely, numbness and tingling in your mouth or lips, lightheadedness, or anxiety may occur. Call your doctor right away if  you think you may be experiencing any of these sensations, or if you have other questions regarding possible side effects.  Follow all other discharge instructions given to you by your surgeon or nurse. Eat a healthy diet and drink plenty of water or other fluids.  If you return to the hospital for any reason within 96 hours following the administration of EXPAREL, it is important for health care providers to know that you have received this anesthetic. A teal colored band has been placed on your arm with the date, time and amount of EXPAREL you have received in order to alert and inform your health care providers. Please leave this armband in place for the full 96 hours following administration, and then you may  remove the band.    Donjoy Ultrasling III (Red ball):  Please contact your surgeon if you have questions or concerns about your sling.       Next dose of Tylenol can be given at 12:30pm if needed.

## 2022-03-11 NOTE — Anesthesia Procedure Notes (Signed)
Procedure Name: Intubation Date/Time: 03/11/2022 7:51 AM  Performed by: Verita Lamb, CRNAPre-anesthesia Checklist: Patient identified, Emergency Drugs available, Suction available and Patient being monitored Patient Re-evaluated:Patient Re-evaluated prior to induction Oxygen Delivery Method: Circle system utilized Preoxygenation: Pre-oxygenation with 100% oxygen Induction Type: IV induction Ventilation: Mask ventilation without difficulty Laryngoscope Size: Mac and 4 Tube type: Oral Tube size: 7.5 mm Number of attempts: 1 Airway Equipment and Method: Stylet and Oral airway Placement Confirmation: ETT inserted through vocal cords under direct vision, positive ETCO2, breath sounds checked- equal and bilateral and CO2 detector Secured at: 23 cm Tube secured with: Tape Dental Injury: Teeth and Oropharynx as per pre-operative assessment

## 2022-03-12 ENCOUNTER — Encounter (HOSPITAL_BASED_OUTPATIENT_CLINIC_OR_DEPARTMENT_OTHER): Payer: Self-pay | Admitting: Orthopaedic Surgery

## 2022-03-12 NOTE — Progress Notes (Signed)
Left message stating courtesy call and if any questions or concerns please call the doctors office.  

## 2022-03-13 LAB — SURGICAL PATHOLOGY

## 2022-03-14 ENCOUNTER — Encounter (HOSPITAL_BASED_OUTPATIENT_CLINIC_OR_DEPARTMENT_OTHER): Payer: Self-pay | Admitting: Physical Therapy

## 2022-03-14 ENCOUNTER — Ambulatory Visit (HOSPITAL_BASED_OUTPATIENT_CLINIC_OR_DEPARTMENT_OTHER): Payer: Managed Care, Other (non HMO) | Attending: Orthopaedic Surgery | Admitting: Physical Therapy

## 2022-03-14 DIAGNOSIS — M6281 Muscle weakness (generalized): Secondary | ICD-10-CM | POA: Insufficient documentation

## 2022-03-14 DIAGNOSIS — S46892A Other injury of other muscles, fascia and tendons at shoulder and upper arm level, left arm, initial encounter: Secondary | ICD-10-CM | POA: Diagnosis not present

## 2022-03-14 DIAGNOSIS — M25612 Stiffness of left shoulder, not elsewhere classified: Secondary | ICD-10-CM | POA: Insufficient documentation

## 2022-03-14 DIAGNOSIS — M25512 Pain in left shoulder: Secondary | ICD-10-CM | POA: Diagnosis present

## 2022-03-14 DIAGNOSIS — R600 Localized edema: Secondary | ICD-10-CM | POA: Diagnosis present

## 2022-03-14 NOTE — Therapy (Signed)
OUTPATIENT PHYSICAL THERAPY SHOULDER EVALUATION   Patient Name: Kent Johnson MRN: [id number] DOB:[birth year], 36 y.o., male Today's Date: 03/14/2022   PT End of Session - 03/14/22 1203     Visit Number 1    Number of Visits 22    Date for PT Re-Evaluation 05/09/22    Authorization Type [employer]    Authorization Time Period 03/14/22 to 05/09/22    PT Start Time 1103    PT Stop Time 1145    PT Time Calculation (min) 42 min    Activity Tolerance Patient tolerated treatment well    Behavior During Therapy [facility] for tasks assessed/performed             Past Medical History:  Diagnosis Date   Anxiety    Past Surgical History:  Procedure Laterality Date   CLAVICLE SURGERY  2011   broke collar bone skiing   LIPOMA EXCISION  2007   benign lipoma in colon, caused intersepcion   SHOULDER ARTHROSCOPY WITH DEBRIDEMENT AND BICEP TENDON REPAIR Left 03/11/2022   Procedure: LEFT SHOULDER ARTHROSCOPY WITH MINI OPEN SUBSCAPULARIS REPAIR  AND BICEP TENDODESIS;  Surgeon: Huel Cote, MD;  Location: [facility];  Service: Orthopedics;  Laterality: Left;   Patient Active Problem List   Diagnosis Date Noted   Avulsion of left subscapularis    Anxiety disorder 11/11/2021   Insomnia 11/11/2021    PCP: [name], [name]   REFERRING PROVIDER: Huel Cote, MD   REFERRING DIAG: [phone] (ICD-10-CM) - Avulsion of left subscapularis, initial encounter   THERAPY DIAG:  Acute pain of left shoulder  Stiffness of left shoulder, not elsewhere classified  Muscle weakness (generalized)  Localized edema  Rationale for Evaluation and Treatment Rehabilitation  ONSET DATE: 02/03/2022   SUBJECTIVE:                                                                                                                                                                                      SUBJECTIVE STATEMENT: Shoulder is feeling well, haven't had to take any of the Oxy, pain is  doing well overall. Its not as bad as I expected it to be. I started taking my arm out of the sling and stretching it on my own. That's about it.   PERTINENT HISTORY: 36 y.o. 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  I did discuss treatment options with him.  At this time his mechanical symptoms continue to be quite bothersome for him.  He does have noticeable weakness in the setting of a tear of the subscapularis which is likely been going on for quite some  time.  I did discuss possible treatment options.  At this time I do not feel an injection would benefit him significantly as this would not remove his focus of heterotopic ossification.  I did discuss that a formal rehab protocol could provide some benefit although that being said he does continue to experience significant mechanical symptoms associated with area of heterotopic ossification so I am less hopeful that physical therapy would provide any type of meaningful difference as his previous home exercise rotator cuff programs have led to aggravation of the symptoms.  At this time given his young age and desire to stay active, I do believe that he would benefit from a surgery to remove his focus of heterotopic ossification.  I would plan to send this for pathology as well.  I would plan to repair the subscapularis tendon in an open fashion at that time.  We did discuss that he does also have some biceps symptoms as well.  There is evidence of vital sign of subluxation on the MRI.  As result I do believe that he would benefit from a biceps tenodesis at the time of open subscapularis repair.  After discussion of his options at some length.  He has opted for surgical intervention.   PAIN:  Are you having pain? Yes: NPRS scale: 4/10 Pain location: L shoulder  Pain description: not centralized, more like a light throb  Aggravating factors: sudden jolts   Relieving factors: pain medicine, in or out of sling depending   PRECAUTIONS: Shoulder subscap protocol   WEIGHT BEARING RESTRICTIONS Yes NWB   FALLS:  Has patient fallen in last 6 months? No  LIVING ENVIRONMENT: Lives with: lives with their family Lives in: House/apartment Stairs: 5 STE B, flight U rail  Has following equipment at home:  ice machine   OCCUPATION: Engineer   PLOF: Independent, Independent with basic ADLs, Independent with gait, and Independent with transfers  PATIENT GOALS back to 100%, be pain free, get ROM back, recreational sports   OBJECTIVE:   DIAGNOSTIC FINDINGS:    PATIENT SURVEYS:  FOTO 38  COGNITION:  Overall cognitive status: Within functional limits for tasks assessed     SENSATION: Not tested  POSTURE: wnl  UPPER EXTREMITY ROM:   Passive ROM Right eval Left eval  Shoulder flexion  50 able to progress to about 120   Shoulder extension    Shoulder abduction  45  Shoulder adduction    Shoulder internal rotation  45 at 45 degrees ABD   Shoulder external rotation  30 at zero degrees ABD   Elbow flexion    Elbow extension    Wrist flexion    Wrist extension    Wrist ulnar deviation    Wrist radial deviation    Wrist pronation    Wrist supination    (Blank rows = not tested)  UPPER EXTREMITY MMT:  MMT Right eval Left eval  Shoulder flexion    Shoulder extension    Shoulder abduction    Shoulder adduction    Shoulder internal rotation    Shoulder external rotation    Middle trapezius    Lower trapezius    Elbow flexion    Elbow extension    Wrist flexion    Wrist extension    Wrist ulnar deviation    Wrist radial deviation    Wrist pronation    Wrist supination    Grip strength (lbs)    (Blank rows = not tested)  SHOULDER SPECIAL TESTS:  JOINT MOBILITY TESTING:    PALPATION:     TODAY'S TREATMENT:   Flexion, abduction, ER/IR PROM as appropriate in parameters of protocol  Dressings changed- all  incisions CDI with no signs of infection noted    PATIENT EDUCATION: Education details: exam findings, post op protocol, HEP, POC Person educated: Patient Education method: Explanation, Demonstration, and Handouts Education comprehension: verbalized understanding and returned demonstration   HOME EXERCISE PROGRAM: [id number]  ASSESSMENT:  CLINICAL IMPRESSION: Patient is a 36 y.o. M who was seen today for physical therapy evaluation and treatment for rehab post op L shoulder arthroscopy with mini open subscapularis repair and bicep tenodesis. Exam is typical for post-op status and all incisions were CDI with no signs of infection. Will benefit from skilled PT services to address limitations and assist in return to optimal level of function.    OBJECTIVE IMPAIRMENTS decreased ROM, decreased strength, hypomobility, increased edema, increased fascial restrictions, impaired flexibility, impaired UE functional use, and pain.   ACTIVITY LIMITATIONS carrying, lifting, bed mobility, bathing, toileting, dressing, self feeding, reach over head, hygiene/grooming, and caring for others  PARTICIPATION LIMITATIONS: meal prep, cleaning, laundry, interpersonal relationship, driving, shopping, community activity, and occupation  PERSONAL FACTORS Age, Past/current experiences, and Time since onset of injury/illness/exacerbation are also affecting patient's functional outcome.   REHAB POTENTIAL: Excellent  CLINICAL DECISION MAKING: Stable/uncomplicated  EVALUATION COMPLEXITY: Low   GOALS: Goals reviewed with patient? Yes  SHORT TERM GOALS: Target date: 04/11/2022  (Remove Blue Hyperlink)  L shoulder flexion PROM and AAROM to be at least 150 degrees Baseline: Goal status: INITIAL  2.  L shoulder ABD PROM to be 90 degrees  Baseline:  Goal status: INITIAL  3.  L shoulder ER PROM to be 45 degrees  Baseline:  Goal status: INITIAL  4.  Will be compliant with appropriate progressive HEP   Baseline:  Goal status: INITIAL   LONG TERM GOALS: Target date: 05/09/2022  (Remove Blue Hyperlink)  L shoulder PROM and AROM to be full in all planes of motion Baseline:  Goal status: INITIAL  2.  MMT to be at least 4/5 in all groups L shoulder  Baseline:  Goal status: INITIAL  3.  Will be able to reach overhead without increase in pain or poor scapular mechanics  Baseline:  Goal status: INITIAL  4.  Will have returned to light gym based exercise program as appropriate dependent on progress post-op  Baseline:  Goal status: INITIAL   PLAN: PT FREQUENCY:  3x/week for first 4 weeks, then 2x/week for next 4 weeks   PT DURATION: 8 weeks  PLANNED INTERVENTIONS: Therapeutic exercises, Therapeutic activity, Neuromuscular re-education, Balance training, Gait training, Patient/Family education, Self Care, Joint mobilization, Dry Needling, Electrical stimulation, Cryotherapy, Moist heat, Taping, Ultrasound, Ionotophoresis 4mg /ml Dexamethasone, Manual therapy, and Re-evaluation  PLAN FOR NEXT SESSION: progress as appropriate per subscap protocol (per Dr. Serena Croissant op note)   Lerry Liner PT DPT PN2  03/14/2022, 12:05 PM

## 2022-03-17 ENCOUNTER — Telehealth (HOSPITAL_BASED_OUTPATIENT_CLINIC_OR_DEPARTMENT_OTHER): Payer: Self-pay | Admitting: Orthopaedic Surgery

## 2022-03-17 NOTE — Therapy (Signed)
OUTPATIENT PHYSICAL THERAPY TREATMENT NOTE   Patient Name: Kent Johnson MRN: [id number] DOB:1985-09-08, 36 y.o., male Today's Date: 03/19/2022   PT End of Session - 03/18/22 1538     Visit Number 2    Number of Visits 22    Date for PT Re-Evaluation 05/09/22    Authorization Type [employer]    Authorization Time Period 03/14/22 to 05/09/22    PT Start Time 1526    PT Stop Time 1601    PT Time Calculation (min) 35 min    Activity Tolerance Patient tolerated treatment well    Behavior During Therapy [facility] for tasks assessed/performed              Past Medical History:  Diagnosis Date   Anxiety    Past Surgical History:  Procedure Laterality Date   CLAVICLE SURGERY  2011   broke collar bone skiing   LIPOMA EXCISION  2007   benign lipoma in colon, caused intersepcion   SHOULDER ARTHROSCOPY WITH DEBRIDEMENT AND BICEP TENDON REPAIR Left 03/11/2022   Procedure: LEFT SHOULDER ARTHROSCOPY WITH MINI OPEN SUBSCAPULARIS REPAIR  AND BICEP TENDODESIS;  Surgeon: Huel Cote, MD;  Location: [facility];  Service: Orthopedics;  Laterality: Left;   Patient Active Problem List   Diagnosis Date Noted   Avulsion of left subscapularis    Anxiety disorder 11/11/2021   Insomnia 11/11/2021    PCP: [name], [name]   REFERRING PROVIDER: Huel Cote, MD   REFERRING DIAG: [phone] (ICD-10-CM) - Avulsion of left subscapularis, initial encounter   THERAPY DIAG:  Acute pain of left shoulder  Stiffness of left shoulder, not elsewhere classified  Muscle weakness (generalized)  Rationale for Evaluation and Treatment Rehabilitation  ONSET DATE: 02/03/2022   SUBJECTIVE:                                                                                                                                                                                      SUBJECTIVE STATEMENT: Pt is 1 week s/p L subscapularis repair and biceps tenodesis.  Pt states he had some pain  after prior Rx though felt fine the following day.  Pt reports his shoulder has been feeling pretty good since.  Pt states his UE feels good in the sling and he is compliant with sling usage.  Pt reports compliance with HEP.  Pt states his wife has been performing PROM and he has done some on his own too.  PT instructed to let wife perform PROM and for him to not perform PROM due to probably activating L shoulder.    PERTINENT HISTORY: L shoulder Arthroscopic rotator cuff repair (subscapularis) and biceps tenodesis on  03/11/22 36 y.o. 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  I did discuss treatment options with him.  At this time his mechanical symptoms continue to be quite bothersome for him.  He does have noticeable weakness in the setting of a tear of the subscapularis which is likely been going on for quite some time.  I did discuss possible treatment options.  At this time I do not feel an injection would benefit him significantly as this would not remove his focus of heterotopic ossification.  I did discuss that a formal rehab protocol could provide some benefit although that being said he does continue to experience significant mechanical symptoms associated with area of heterotopic ossification so I am less hopeful that physical therapy would provide any type of meaningful difference as his previous home exercise rotator cuff programs have led to aggravation of the symptoms.  At this time given his young age and desire to stay active, I do believe that he would benefit from a surgery to remove his focus of heterotopic ossification.  I would plan to send this for pathology as well.  I would plan to repair the subscapularis tendon in an open fashion at that time.  We did discuss that he does also have some biceps symptoms as well.  There is evidence of vital sign of subluxation on the MRI.  As result I do believe  that he would benefit from a biceps tenodesis at the time of open subscapularis repair.  After discussion of his options at some length.  He has opted for surgical intervention.   PAIN:  Are you having pain? Yes: NPRS scale: 3/10 Pain location: L shoulder  Pain description: not centralized, more like a light throb  Aggravating factors: sudden jolts  Relieving factors: pain medicine, in or out of sling depending   PRECAUTIONS: Shoulder subscap protocol   WEIGHT BEARING RESTRICTIONS Yes NWB   FALLS:  Has patient fallen in last 6 months? No  LIVING ENVIRONMENT: Lives with: lives with their family Lives in: House/apartment Stairs: 5 STE B, flight U rail  Has following equipment at home:  ice machine   OCCUPATION: Engineer   PLOF: Independent, Independent with basic ADLs, Independent with gait, and Independent with transfers  PATIENT GOALS back to 100%, be pain free, get ROM back, recreational sports   OBJECTIVE:   TODAY'S TREATMENT:     -OBSERVATION:   PT Checked Dressings.  Tegaderm and  gauze in place over portals. There is no drainage. Areas are clean and dry.     -Pt performed:  Wrist flex/extension AROM with arm in sling on chair arm 3x10 reps            Towel gripping with arm in sling on chair arm 3x10 reps  Pt received a HEP handout and was educated in correct form and appropriate frequency.  Instructed pt to have arm supported in sling with updated home exercises.    -Pt received L shoulder PROM per tissue tolerance and pt tolerance w/n protocol ranges.     PATIENT EDUCATION: Education details: post op protocol, restrictions, and expectations.  Be compliant with wearing sling.  HEP and POC.   PT instructed to let wife perform PROM and for him to not perform PROM due to probably activating L shoulder.  Updated HEP.  Person educated: Patient Education method: Explanation, Demonstration, and Handouts Education comprehension: verbalized understanding and returned  demonstration   HOME EXERCISE  PROGRAM: [id number]  Updated HEP: - Wrist AROM Flexion Extension  - 3 x daily - 7 x weekly - 2-3 sets - 10 reps - Seated Gripping Towel  - 2-3 x daily - 7 x weekly - 2 sets - 10 reps  ASSESSMENT:  CLINICAL IMPRESSION: Pt reports he is compliant with wearing sling and performing HEP.  PT checked dressings.  Areas are clean and dry and there is no drainage.  PT did not change dressings today though will plan to change next visit.  PT gave pt a HEP handout consisting of wrist and hand exercises and was instructed to perform in sling.  Pt performed exercises well and demonstrated good understanding of HEP.  Pt tolerated PROM per protocol well and has appropriate PROM per protocol at this time.  He responded well to Rx having no increased pain after Rx.  He should benefit from cont skilled PT services per protocol to address goals and impairments and to assist in restoring desired level of function.     OBJECTIVE IMPAIRMENTS decreased ROM, decreased strength, hypomobility, increased edema, increased fascial restrictions, impaired flexibility, impaired UE functional use, and pain.   ACTIVITY LIMITATIONS carrying, lifting, bed mobility, bathing, toileting, dressing, self feeding, reach over head, hygiene/grooming, and caring for others  PARTICIPATION LIMITATIONS: meal prep, cleaning, laundry, interpersonal relationship, driving, shopping, community activity, and occupation  PERSONAL FACTORS Age, Past/current experiences, and Time since onset of injury/illness/exacerbation are also affecting patient's functional outcome.   REHAB POTENTIAL: Excellent  CLINICAL DECISION MAKING: Stable/uncomplicated  EVALUATION COMPLEXITY: Low   GOALS: Goals reviewed with patient? Yes  SHORT TERM GOALS: Target date: 04/11/2022  (Remove Blue Hyperlink)  L shoulder flexion PROM and AAROM to be at least 150 degrees Baseline: Goal status: INITIAL  2.  L shoulder ABD PROM to be 90  degrees  Baseline:  Goal status: INITIAL  3.  L shoulder ER PROM to be 45 degrees  Baseline:  Goal status: INITIAL  4.  Will be compliant with appropriate progressive HEP  Baseline:  Goal status: INITIAL   LONG TERM GOALS: Target date: 05/09/2022  (Remove Blue Hyperlink)  L shoulder PROM and AROM to be full in all planes of motion Baseline:  Goal status: INITIAL  2.  MMT to be at least 4/5 in all groups L shoulder  Baseline:  Goal status: INITIAL  3.  Will be able to reach overhead without increase in pain or poor scapular mechanics  Baseline:  Goal status: INITIAL  4.  Will have returned to light gym based exercise program as appropriate dependent on progress post-op  Baseline:  Goal status: INITIAL   PLAN: PT FREQUENCY:  3x/week for first 4 weeks, then 2x/week for next 4 weeks   PT DURATION: 8 weeks  PLANNED INTERVENTIONS: Therapeutic exercises, Therapeutic activity, Neuromuscular re-education, Balance training, Gait training, Patient/Family education, Self Care, Joint mobilization, Dry Needling, Electrical stimulation, Cryotherapy, Moist heat, Taping, Ultrasound, Ionotophoresis 4mg /ml Dexamethasone, Manual therapy, and Re-evaluation  PLAN FOR NEXT SESSION:  Cont with Dr. Serena Croissant Subscapularis repair protocol with regards to biceps tenodesis.  Change dressings next visit.    Audie Clear III PT, DPT 03/19/22 10:40 PM

## 2022-03-17 NOTE — Telephone Encounter (Signed)
Post op med question

## 2022-03-18 ENCOUNTER — Encounter (HOSPITAL_BASED_OUTPATIENT_CLINIC_OR_DEPARTMENT_OTHER): Payer: Self-pay | Admitting: Physical Therapy

## 2022-03-18 ENCOUNTER — Ambulatory Visit (HOSPITAL_BASED_OUTPATIENT_CLINIC_OR_DEPARTMENT_OTHER): Payer: Managed Care, Other (non HMO) | Admitting: Physical Therapy

## 2022-03-18 DIAGNOSIS — M6281 Muscle weakness (generalized): Secondary | ICD-10-CM

## 2022-03-18 DIAGNOSIS — M25512 Pain in left shoulder: Secondary | ICD-10-CM | POA: Diagnosis not present

## 2022-03-18 DIAGNOSIS — M25612 Stiffness of left shoulder, not elsewhere classified: Secondary | ICD-10-CM

## 2022-03-20 ENCOUNTER — Ambulatory Visit (HOSPITAL_BASED_OUTPATIENT_CLINIC_OR_DEPARTMENT_OTHER): Payer: Managed Care, Other (non HMO) | Admitting: Physical Therapy

## 2022-03-20 ENCOUNTER — Encounter (HOSPITAL_BASED_OUTPATIENT_CLINIC_OR_DEPARTMENT_OTHER): Payer: Self-pay | Admitting: Physical Therapy

## 2022-03-20 DIAGNOSIS — M25612 Stiffness of left shoulder, not elsewhere classified: Secondary | ICD-10-CM

## 2022-03-20 DIAGNOSIS — M25512 Pain in left shoulder: Secondary | ICD-10-CM

## 2022-03-20 DIAGNOSIS — M6281 Muscle weakness (generalized): Secondary | ICD-10-CM

## 2022-03-20 DIAGNOSIS — R600 Localized edema: Secondary | ICD-10-CM

## 2022-03-20 NOTE — Therapy (Signed)
OUTPATIENT PHYSICAL THERAPY TREATMENT NOTE   Patient Name: Kent Johnson MRN: [id number] DOB:1985/10/12, 36 y.o., male Today's Date: 03/20/2022   PT End of Session - 03/20/22 1351     Visit Number 3    Number of Visits 22    Date for PT Re-Evaluation 05/09/22    Authorization Type [employer]    Authorization Time Period 03/14/22 to 05/09/22    PT Start Time 1350    PT Stop Time 1414    PT Time Calculation (min) 24 min    Activity Tolerance Patient tolerated treatment well    Behavior During Therapy [facility] for tasks assessed/performed               Past Medical History:  Diagnosis Date   Anxiety    Past Surgical History:  Procedure Laterality Date   CLAVICLE SURGERY  2011   broke collar bone skiing   LIPOMA EXCISION  2007   benign lipoma in colon, caused intersepcion   SHOULDER ARTHROSCOPY WITH DEBRIDEMENT AND BICEP TENDON REPAIR Left 03/11/2022   Procedure: LEFT SHOULDER ARTHROSCOPY WITH MINI OPEN SUBSCAPULARIS REPAIR  AND BICEP TENDODESIS;  Surgeon: Huel Cote, MD;  Location: [facility];  Service: Orthopedics;  Laterality: Left;   Patient Active Problem List   Diagnosis Date Noted   Avulsion of left subscapularis    Anxiety disorder 11/11/2021   Insomnia 11/11/2021    PCP: [name], [name]   REFERRING PROVIDER: Huel Cote, MD   REFERRING DIAG: [phone] (ICD-10-CM) - Avulsion of left subscapularis, initial encounter   THERAPY DIAG:  Acute pain of left shoulder  Stiffness of left shoulder, not elsewhere classified  Muscle weakness (generalized)  Localized edema  Rationale for Evaluation and Treatment Rehabilitation  ONSET DATE: 02/03/2022, DOS 8/22  SUBJECTIVE:                                                                                                                                                                                      SUBJECTIVE STATEMENT: Pt is 1 week & 2 days s/p L subscapularis repair and biceps  tenodesis.    Don't feel like I need the pain meds. Most soreness at night.    PERTINENT HISTORY: L shoulder Arthroscopic rotator cuff repair (subscapularis) and biceps tenodesis on 03/11/22 36 y.o. 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  I did discuss treatment options with him.  At this time his mechanical symptoms continue to be quite bothersome for him.  He does have noticeable weakness in the setting of a tear of the subscapularis which is likely been going on for quite some  time.  I did discuss possible treatment options.  At this time I do not feel an injection would benefit him significantly as this would not remove his focus of heterotopic ossification.  I did discuss that a formal rehab protocol could provide some benefit although that being said he does continue to experience significant mechanical symptoms associated with area of heterotopic ossification so I am less hopeful that physical therapy would provide any type of meaningful difference as his previous home exercise rotator cuff programs have led to aggravation of the symptoms.  At this time given his young age and desire to stay active, I do believe that he would benefit from a surgery to remove his focus of heterotopic ossification.  I would plan to send this for pathology as well.  I would plan to repair the subscapularis tendon in an open fashion at that time.  We did discuss that he does also have some biceps symptoms as well.  There is evidence of vital sign of subluxation on the MRI.  As result I do believe that he would benefit from a biceps tenodesis at the time of open subscapularis repair.  After discussion of his options at some length.  He has opted for surgical intervention.   PAIN:  Are you having pain? Yes: NPRS scale: 3/10 Pain location: L shoulder  Pain description: not centralized, more like a light throb  Aggravating  factors: sudden jolts  Relieving factors: pain medicine, in or out of sling depending   PRECAUTIONS: Shoulder subscap protocol   WEIGHT BEARING RESTRICTIONS Yes NWB   FALLS:  Has patient fallen in last 6 months? No  LIVING ENVIRONMENT: Lives with: lives with their family Lives in: House/apartment Stairs: 5 STE B, flight U rail  Has following equipment at home:  ice machine   OCCUPATION: Engineer   PLOF: Independent, Independent with basic ADLs, Independent with gait, and Independent with transfers  PATIENT GOALS back to 100%, be pain free, get ROM back, recreational sports   OBJECTIVE:   TODAY'S TREATMENT:  Changed bandages- no s/s of infection, healing well PROM to shoulder as appropriate STM to Lt upper trap & levator     PATIENT EDUCATION: Education details: sleeping posture, anatomy of condition Person educated: Patient Education method: Explanation, Demonstration, and Handouts Education comprehension: verbalized understanding and returned demonstration   HOME EXERCISE PROGRAM: [email or web address]   ASSESSMENT:  CLINICAL IMPRESSION: Pt is progressing very well, shoulder flexion to 140 easily and without any discomfort.    OBJECTIVE IMPAIRMENTS decreased ROM, decreased strength, hypomobility, increased edema, increased fascial restrictions, impaired flexibility, impaired UE functional use, and pain.   ACTIVITY LIMITATIONS carrying, lifting, bed mobility, bathing, toileting, dressing, self feeding, reach over head, hygiene/grooming, and caring for others  PARTICIPATION LIMITATIONS: meal prep, cleaning, laundry, interpersonal relationship, driving, shopping, community activity, and occupation  PERSONAL FACTORS Age, Past/current experiences, and Time since onset of injury/illness/exacerbation are also affecting patient's functional outcome.   REHAB POTENTIAL: Excellent  CLINICAL DECISION MAKING: Stable/uncomplicated  EVALUATION COMPLEXITY: Low   GOALS: Goals  reviewed with patient? Yes  SHORT TERM GOALS: Target date: 04/11/2022  (Remove Blue Hyperlink)  L shoulder flexion PROM and AAROM to be at least 150 degrees Baseline: Goal status: INITIAL  2.  L shoulder ABD PROM to be 90 degrees  Baseline:  Goal status: INITIAL  3.  L shoulder ER PROM to be 45 degrees  Baseline:  Goal status: INITIAL  4.  Will be compliant with appropriate progressive HEP  Baseline:  Goal status: INITIAL   LONG TERM GOALS: Target date: 05/09/2022  (Remove Blue Hyperlink)  L shoulder PROM and AROM to be full in all planes of motion Baseline:  Goal status: INITIAL  2.  MMT to be at least 4/5 in all groups L shoulder  Baseline:  Goal status: INITIAL  3.  Will be able to reach overhead without increase in pain or poor scapular mechanics  Baseline:  Goal status: INITIAL  4.  Will have returned to light gym based exercise program as appropriate dependent on progress post-op  Baseline:  Goal status: INITIAL   PLAN: PT FREQUENCY:  3x/week for first 4 weeks, then 2x/week for next 4 weeks   PT DURATION: 8 weeks  PLANNED INTERVENTIONS: Therapeutic exercises, Therapeutic activity, Neuromuscular re-education, Balance training, Gait training, Patient/Family education, Self Care, Joint mobilization, Dry Needling, Electrical stimulation, Cryotherapy, Moist heat, Taping, Ultrasound, Ionotophoresis 4mg /ml Dexamethasone, Manual therapy, and Re-evaluation  PLAN FOR NEXT SESSION:  Cont with Dr. Serena Croissant Subscapularis repair protocol with regards to biceps tenodesis.  Periscap activation without shoulder motion. Avoid passive ER.    Latrisa Hellums C. Janiyha Montufar PT, DPT 03/20/22 2:15 PM

## 2022-03-26 ENCOUNTER — Ambulatory Visit (HOSPITAL_BASED_OUTPATIENT_CLINIC_OR_DEPARTMENT_OTHER): Payer: Managed Care, Other (non HMO) | Attending: Orthopaedic Surgery | Admitting: Physical Therapy

## 2022-03-26 ENCOUNTER — Ambulatory Visit (INDEPENDENT_AMBULATORY_CARE_PROVIDER_SITE_OTHER): Payer: Managed Care, Other (non HMO) | Admitting: Orthopaedic Surgery

## 2022-03-26 ENCOUNTER — Encounter (HOSPITAL_BASED_OUTPATIENT_CLINIC_OR_DEPARTMENT_OTHER): Payer: Self-pay | Admitting: Physical Therapy

## 2022-03-26 DIAGNOSIS — R600 Localized edema: Secondary | ICD-10-CM | POA: Diagnosis present

## 2022-03-26 DIAGNOSIS — M25612 Stiffness of left shoulder, not elsewhere classified: Secondary | ICD-10-CM | POA: Insufficient documentation

## 2022-03-26 DIAGNOSIS — M25512 Pain in left shoulder: Secondary | ICD-10-CM | POA: Diagnosis present

## 2022-03-26 DIAGNOSIS — M6281 Muscle weakness (generalized): Secondary | ICD-10-CM | POA: Insufficient documentation

## 2022-03-26 DIAGNOSIS — S46892A Other injury of other muscles, fascia and tendons at shoulder and upper arm level, left arm, initial encounter: Secondary | ICD-10-CM

## 2022-03-26 NOTE — Therapy (Signed)
OUTPATIENT PHYSICAL THERAPY TREATMENT NOTE   Patient Name: Kent Johnson MRN: [id number] DOB:12/14/1985, 36 y.o., male Today's Date: 03/26/2022   PT End of Session - 03/26/22 0919     Visit Number 4    Number of Visits 22    Date for PT Re-Evaluation 05/09/22    Authorization Type [employer]    Authorization Time Period 03/14/22 to 05/09/22    PT Start Time 0845    PT Stop Time 0915    PT Time Calculation (min) 30 min    Activity Tolerance Patient tolerated treatment well    Behavior During Therapy [facility] for tasks assessed/performed                Past Medical History:  Diagnosis Date   Anxiety    Past Surgical History:  Procedure Laterality Date   CLAVICLE SURGERY  2011   broke collar bone skiing   LIPOMA EXCISION  2007   benign lipoma in colon, caused intersepcion   SHOULDER ARTHROSCOPY WITH DEBRIDEMENT AND BICEP TENDON REPAIR Left 03/11/2022   Procedure: LEFT SHOULDER ARTHROSCOPY WITH MINI OPEN SUBSCAPULARIS REPAIR  AND BICEP TENDODESIS;  Surgeon: Huel Cote, MD;  Location: [facility];  Service: Orthopedics;  Laterality: Left;   Patient Active Problem List   Diagnosis Date Noted   Avulsion of left subscapularis    Anxiety disorder 11/11/2021   Insomnia 11/11/2021    PCP: [name], [name]   REFERRING PROVIDER: Huel Cote, MD   REFERRING DIAG: [phone] (ICD-10-CM) - Avulsion of left subscapularis, initial encounter   THERAPY DIAG:  Acute pain of left shoulder  Stiffness of left shoulder, not elsewhere classified  Muscle weakness (generalized)  Localized edema  Rationale for Evaluation and Treatment Rehabilitation  ONSET DATE: 02/03/2022, DOS 8/22  SUBJECTIVE:                                                                                                                                                                                      SUBJECTIVE STATEMENT: Pt is 2 week & 1 days s/p L subscapularis repair and biceps  tenodesis.    Pt states MD visit went well. He can go without the abduction pillow now. He states MD said everything looked at expected and had stitches removed. He did not pain with HEP. He has some aching with PROM at home.    PERTINENT HISTORY: L shoulder Arthroscopic rotator cuff repair (subscapularis) and biceps tenodesis on 03/11/22 36 y.o. 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  I did discuss treatment options with him.  At this time his mechanical symptoms continue to  be quite bothersome for him.  He does have noticeable weakness in the setting of a tear of the subscapularis which is likely been going on for quite some time.  I did discuss possible treatment options.  At this time I do not feel an injection would benefit him significantly as this would not remove his focus of heterotopic ossification.  I did discuss that a formal rehab protocol could provide some benefit although that being said he does continue to experience significant mechanical symptoms associated with area of heterotopic ossification so I am less hopeful that physical therapy would provide any type of meaningful difference as his previous home exercise rotator cuff programs have led to aggravation of the symptoms.  At this time given his young age and desire to stay active, I do believe that he would benefit from a surgery to remove his focus of heterotopic ossification.  I would plan to send this for pathology as well.  I would plan to repair the subscapularis tendon in an open fashion at that time.  We did discuss that he does also have some biceps symptoms as well.  There is evidence of vital sign of subluxation on the MRI.  As result I do believe that he would benefit from a biceps tenodesis at the time of open subscapularis repair.  After discussion of his options at some length.  He has opted for surgical intervention.   PAIN:   Are you having pain? Yes: NPRS scale: 3/10 Pain location: L shoulder  Pain description: not centralized, more like a light throb  Aggravating factors: sudden jolts  Relieving factors: pain medicine, in or out of sling depending   PRECAUTIONS: Shoulder subscap protocol   WEIGHT BEARING RESTRICTIONS Yes NWB   FALLS:  Has patient fallen in last 6 months? No  LIVING ENVIRONMENT: Lives with: lives with their family Lives in: House/apartment Stairs: 5 STE B, flight U rail  Has following equipment at home:  ice machine   OCCUPATION: Engineer   PLOF: Independent, Independent with basic ADLs, Independent with gait, and Independent with transfers  PATIENT GOALS back to 100%, be pain free, get ROM back, recreational sports   OBJECTIVE:   Incision site inspected- clean dry, steri-strips in place; very faint spotting noted on pt's shirt; no active bleeding upon inspection  TODAY'S TREATMENT:  PROM 140 flexion; 80 ABD; IR to belly, no PROM ER per subscap repair  Joint mob: post and inf grade II  Review of new HEP additions and expectations until cleared to start A/AROM Post and ant shoulder rolls; scap retraction, PROM elbow for full flexion and ext     PATIENT EDUCATION: Education details: sleeping posture, anatomy of condition Person educated: Patient Education method: Explanation, Demonstration, and Handouts Education comprehension: verbalized understanding and returned demonstration   HOME EXERCISE PROGRAM: Access Code: [email or web address] URL: [email or web address] Date: 03/26/2022 Prepared by: Zebedee Iba  Exercises - Supine Shoulder Flexion PROM with Caregiver  - 3 x daily - 7 x weekly - 1 sets - 10-15 reps - 2 hold - Horizontal Shoulder Pendulum with Table Support  - 3 x daily - 7 x weekly - 1 sets - 30 reps - Wrist AROM Flexion Extension  - 3 x daily - 7 x weekly - 2-3 sets - 10 reps - Seated Gripping Towel  - 2-3 x daily - 7 x weekly - 2 sets - 10 reps -  Shoulder Rolls in Sitting  - 3 x daily - 7 x weekly - 1 sets -  20 reps - Elbow Flexion PROM  - 3 x daily - 7 x weekly - 1 sets - 20 reps   ASSESSMENT:  CLINICAL IMPRESSION:  Pt with very well managed pain. Pt with clean incision sites without active bleeding. Edu given for current phase of protocol and expectations with rehab. Pt able to reach protocol limits at this time very easily. HEP updated at this time. Plan to continue with PROM and manual until cleared for A/AROM. Pt would benefit from continued skilled therapy in order to reach goals and maximize functional L UE strength and ROM for full return to PLOF.   OBJECTIVE IMPAIRMENTS decreased ROM, decreased strength, hypomobility, increased edema, increased fascial restrictions, impaired flexibility, impaired UE functional use, and pain.   ACTIVITY LIMITATIONS carrying, lifting, bed mobility, bathing, toileting, dressing, self feeding, reach over head, hygiene/grooming, and caring for others  PARTICIPATION LIMITATIONS: meal prep, cleaning, laundry, interpersonal relationship, driving, shopping, community activity, and occupation  PERSONAL FACTORS Age, Past/current experiences, and Time since onset of injury/illness/exacerbation are also affecting patient's functional outcome.   REHAB POTENTIAL: Excellent  CLINICAL DECISION MAKING: Stable/uncomplicated  EVALUATION COMPLEXITY: Low   GOALS: Goals reviewed with patient? Yes  SHORT TERM GOALS: Target date: 04/11/2022  (Remove Blue Hyperlink)  L shoulder flexion PROM and AAROM to be at least 150 degrees Baseline: Goal status: INITIAL  2.  L shoulder ABD PROM to be 90 degrees  Baseline:  Goal status: INITIAL  3.  L shoulder ER PROM to be 45 degrees  Baseline:  Goal status: INITIAL  4.  Will be compliant with appropriate progressive HEP  Baseline:  Goal status: INITIAL   LONG TERM GOALS: Target date: 05/09/2022  (Remove Blue Hyperlink)  L shoulder PROM and AROM to be full  in all planes of motion Baseline:  Goal status: INITIAL  2.  MMT to be at least 4/5 in all groups L shoulder  Baseline:  Goal status: INITIAL  3.  Will be able to reach overhead without increase in pain or poor scapular mechanics  Baseline:  Goal status: INITIAL  4.  Will have returned to light gym based exercise program as appropriate dependent on progress post-op  Baseline:  Goal status: INITIAL   PLAN: PT FREQUENCY:  3x/week for first 4 weeks, then 2x/week for next 4 weeks   PT DURATION: 8 weeks  PLANNED INTERVENTIONS: Therapeutic exercises, Therapeutic activity, Neuromuscular re-education, Balance training, Gait training, Patient/Family education, Self Care, Joint mobilization, Dry Needling, Electrical stimulation, Cryotherapy, Moist heat, Taping, Ultrasound, Ionotophoresis 4mg /ml Dexamethasone, Manual therapy, and Re-evaluation  PLAN FOR NEXT SESSION:  Cont with Dr. Serena Croissant Subscapularis repair protocol with regards to biceps tenodesis.  Periscap activation without shoulder motion. Avoid passive ER.   Zebedee Iba PT, DPT 03/26/22 9:23 AM

## 2022-03-26 NOTE — Progress Notes (Signed)
Post Operative Evaluation    Procedure/Date of Surgery: Left shoulder removal of loose body and subscapularis repair 03/11/22  Interval History:    Presents today for follow-up 2 weeks status post the above procedure.  He has been working with physical therapy multiple times weekly.  Overall his pain is much improved and really has no pain at this visit.  He has been compliant with aspirin.   PMH/PSH/Family History/Social History/Meds/Allergies:    Past Medical History:  Diagnosis Date   Anxiety    Past Surgical History:  Procedure Laterality Date   CLAVICLE SURGERY  2011   broke collar bone skiing   LIPOMA EXCISION  2007   benign lipoma in colon, caused intersepcion   SHOULDER ARTHROSCOPY WITH DEBRIDEMENT AND BICEP TENDON REPAIR Left 03/11/2022   Procedure: LEFT SHOULDER ARTHROSCOPY WITH MINI OPEN SUBSCAPULARIS REPAIR  AND BICEP TENDODESIS;  Surgeon: Huel Cote, MD;  Location: [facility];  Service: Orthopedics;  Laterality: Left;   Social History   Socioeconomic History   Marital status: Married    Spouse name: Fleet Contras   Number of children: Not on file   Years of education: Not on file   Highest education level: Master's degree (e.g., MA, MS, MEng, MEd, MSW, MBA)  Occupational History   Not on file  Tobacco Use   Smoking status: Never   Smokeless tobacco: Never  Vaping Use   Vaping Use: Not on file  Substance and Sexual Activity   Alcohol use: Yes    Comment: occas   Drug use: Never   Sexual activity: Yes    Partners: Female  Other Topics Concern   Not on file  Social History Narrative   Not on file   Social Determinants of Health   Financial Resource Strain: Low Risk  (12/28/2021)   Overall Financial Resource Strain (CARDIA)    Difficulty of Paying Living Expenses: Not hard at all  Food Insecurity: No Food Insecurity (12/28/2021)   Hunger Vital Sign    Worried About Running Out of Food in the Last Year:  Never true    Ran Out of Food in the Last Year: Never true  Transportation Needs: No Transportation Needs (12/28/2021)   PRAPARE - Administrator, Civil Service (Medical): No    Lack of Transportation (Non-Medical): No  Physical Activity: Insufficiently Active (12/28/2021)   Exercise Vital Sign    Days of Exercise per Week: 4 days    Minutes of Exercise per Session: 30 min  Stress: No Stress Concern Present (12/28/2021)   [employer] of Occupational Health - Occupational Stress Questionnaire    Feeling of Stress : Only a little  Social Connections: Unknown (12/28/2021)   Social Connection and Isolation Panel [NHANES]    Frequency of Communication with Friends and Family: Twice a week    Frequency of Social Gatherings with Friends and Family: Once a week    Attends Religious Services: Patient refused    Database administrator or Organizations: Yes    Attends Engineer, structural: More than 4 times per year    Marital Status: Married   Family History  Problem Relation Age of Onset   Anxiety disorder Mother    Cancer Mother        melanoma   Miscarriages / [place] Mother  Asthma Brother    Arthritis Maternal Grandmother    Cancer Maternal Grandfather    Heart attack Maternal Grandfather    No Known Allergies Current Outpatient Medications  Medication Sig Dispense Refill   Ascorbic Acid (VITAMIN C) 500 MG CAPS Take by mouth.     aspirin EC 325 MG tablet Take 1 tablet (325 mg total) by mouth daily. 30 tablet 0   Cholecalciferol (VITAMIN D3) 125 MCG (5000 UT) TABS Take by mouth.     cyanocobalamin (VITAMIN B12) 500 MCG tablet Take 500 mcg by mouth daily.     escitalopram (LEXAPRO) 10 MG tablet TAKE 1 TABLET(10 MG) BY MOUTH DAILY 90 tablet 2   fexofenadine (ALLEGRA) 180 MG tablet Take 180 mg by mouth daily.     oxycodone (OXY-IR) 5 MG capsule Take 1 capsule (5 mg total) by mouth every 4 (four) hours as needed (severe pain). 20 capsule 0   No current  facility-administered medications for this visit.   No results found.  Review of Systems:   A ROS was performed including pertinent positives and negatives as documented in the HPI.   Musculoskeletal Exam:    There were no vitals taken for this visit.  Left shoulder portals are well-appearing without erythema or drainage.  Able to flex and extend at the left elbow.  He is active able to flex to 90 degrees in the supine position without pain.  External rotation at the side actively is to 30 degrees again without pain.  Internal rotation deferred to plus radial pulse distal neurosensory exam is intact  Imaging:      I personally reviewed and interpreted the radiographs.   Assessment:   2 weeks status post left shoulder removal of loose body and arthroscopic subscapularis repair overall doing extremely well.  I have advised him given the fact that we do not have repair of his supraspinatus at this time he may begin active forward elevation in the scapular plane.  I have advised specifically against passive external rotation as well as active internal rotation given that he did have a subscapularis repair.  We will plan to progress according to his subscapularis repair protocol.  I will see him back in 4 weeks for reassessment  Plan :    -Return to clinic in 4 weeks for reassessment      I personally saw and evaluated the patient, and participated in the management and treatment plan.  Huel Cote, MD Attending Physician, Orthopedic Surgery  This document was dictated using Dragon voice recognition software. A reasonable attempt at proof reading has been made to minimize errors.

## 2022-03-27 ENCOUNTER — Encounter (HOSPITAL_BASED_OUTPATIENT_CLINIC_OR_DEPARTMENT_OTHER): Payer: Self-pay | Admitting: Physical Therapy

## 2022-03-31 ENCOUNTER — Encounter (HOSPITAL_BASED_OUTPATIENT_CLINIC_OR_DEPARTMENT_OTHER): Payer: Self-pay | Admitting: Physical Therapy

## 2022-03-31 ENCOUNTER — Ambulatory Visit (HOSPITAL_BASED_OUTPATIENT_CLINIC_OR_DEPARTMENT_OTHER): Payer: Managed Care, Other (non HMO) | Admitting: Physical Therapy

## 2022-03-31 DIAGNOSIS — R600 Localized edema: Secondary | ICD-10-CM

## 2022-03-31 DIAGNOSIS — M25512 Pain in left shoulder: Secondary | ICD-10-CM | POA: Diagnosis not present

## 2022-03-31 DIAGNOSIS — M25612 Stiffness of left shoulder, not elsewhere classified: Secondary | ICD-10-CM

## 2022-03-31 DIAGNOSIS — M6281 Muscle weakness (generalized): Secondary | ICD-10-CM

## 2022-03-31 NOTE — Therapy (Signed)
OUTPATIENT PHYSICAL THERAPY TREATMENT NOTE   Patient Name: Kent Johnson MRN: [id number] DOB:May 07, 1986, 36 y.o., male Today's Date: 03/31/2022   PT End of Session - 03/31/22 0852     Visit Number 5    Number of Visits 22    Date for PT Re-Evaluation 05/09/22    Authorization Type [employer]    Authorization Time Period 03/14/22 to 05/09/22    PT Start Time 0845    PT Stop Time 0925    PT Time Calculation (min) 40 min    Activity Tolerance Patient tolerated treatment well    Behavior During Therapy [facility] for tasks assessed/performed                Past Medical History:  Diagnosis Date   Anxiety    Past Surgical History:  Procedure Laterality Date   CLAVICLE SURGERY  2011   broke collar bone skiing   LIPOMA EXCISION  2007   benign lipoma in colon, caused intersepcion   SHOULDER ARTHROSCOPY WITH DEBRIDEMENT AND BICEP TENDON REPAIR Left 03/11/2022   Procedure: LEFT SHOULDER ARTHROSCOPY WITH MINI OPEN SUBSCAPULARIS REPAIR  AND BICEP TENDODESIS;  Surgeon: Huel Cote, MD;  Location: [facility];  Service: Orthopedics;  Laterality: Left;   Patient Active Problem List   Diagnosis Date Noted   Avulsion of left subscapularis    Anxiety disorder 11/11/2021   Insomnia 11/11/2021    PCP: [name], [name]   REFERRING PROVIDER: Huel Cote, MD   REFERRING DIAG: [phone] (ICD-10-CM) - Avulsion of left subscapularis, initial encounter   THERAPY DIAG:  Acute pain of left shoulder  Stiffness of left shoulder, not elsewhere classified  Muscle weakness (generalized)  Localized edema  Rationale for Evaluation and Treatment Rehabilitation  ONSET DATE: 02/03/2022, DOS 8/22  SUBJECTIVE:                                                                                                                                                                                      SUBJECTIVE STATEMENT: Pt is 2 week & 1 days s/p L subscapularis repair and biceps  tenodesis.    The patient reports he has pretty much stopped taking pain medication. He has a little bit of soreness when he is active. No pain today.   PERTINENT HISTORY: L shoulder Arthroscopic rotator cuff repair (subscapularis) and biceps tenodesis on 03/11/22 36 y.o. 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  I did discuss treatment options with him.  At this time his mechanical symptoms continue to be quite bothersome for him.  He does have noticeable weakness in the setting of a  tear of the subscapularis which is likely been going on for quite some time.  I did discuss possible treatment options.  At this time I do not feel an injection would benefit him significantly as this would not remove his focus of heterotopic ossification.  I did discuss that a formal rehab protocol could provide some benefit although that being said he does continue to experience significant mechanical symptoms associated with area of heterotopic ossification so I am less hopeful that physical therapy would provide any type of meaningful difference as his previous home exercise rotator cuff programs have led to aggravation of the symptoms.  At this time given his young age and desire to stay active, I do believe that he would benefit from a surgery to remove his focus of heterotopic ossification.  I would plan to send this for pathology as well.  I would plan to repair the subscapularis tendon in an open fashion at that time.  We did discuss that he does also have some biceps symptoms as well.  There is evidence of vital sign of subluxation on the MRI.  As result I do believe that he would benefit from a biceps tenodesis at the time of open subscapularis repair.  After discussion of his options at some length.  He has opted for surgical intervention.   PAIN:  Are you having pain? Yes: NPRS scale: 0/10 Pain location: L shoulder  Pain  description: not centralized, more like a light throb  Aggravating factors: sudden jolts  Relieving factors: pain medicine, in or out of sling depending   PRECAUTIONS: Shoulder subscap protocol   WEIGHT BEARING RESTRICTIONS Yes NWB   FALLS:  Has patient fallen in last 6 months? No  LIVING ENVIRONMENT: Lives with: lives with their family Lives in: House/apartment Stairs: 5 STE B, flight U rail  Has following equipment at home:  ice machine   OCCUPATION: Engineer   PLOF: Independent, Independent with basic ADLs, Independent with gait, and Independent with transfers  PATIENT GOALS back to 100%, be pain free, get ROM back, recreational sports   OBJECTIVE:   Incision site inspected- clean dry, steri-strips in place; very faint spotting noted on pt's shirt; no active bleeding upon inspection  TODAY'S TREATMENT:  9/11 PROM 140 flexion; 80 ABD; IR to belly, no PROM ER per subscap repair  Joint mob: post and inf grade II Trigger ppit release to upper trap   Seated scpa retraction to neutral 3x10  Supine active ER with self controled return to IR 2x10   Self assisted forward flexion 3x10   Last visit  PROM 140 flexion; 80 ABD; IR to belly, no PROM ER per subscap repair  Joint mob: post and inf grade II  Review of new HEP additions and expectations until cleared to start A/AROM Post and ant shoulder rolls; scap retraction, PROM elbow for full flexion and ext     PATIENT EDUCATION: Education details: sleeping posture, anatomy of condition Person educated: Patient Education method: Explanation, Demonstration, and Handouts Education comprehension: verbalized understanding and returned demonstration   HOME EXERCISE PROGRAM: Access Code: [email or web address] URL: [email or web address] Date: 03/26/2022 Prepared by: Zebedee Iba  Exercises - Supine Shoulder Flexion PROM with Caregiver  - 3 x daily - 7 x weekly - 1 sets - 10-15 reps - 2 hold - Horizontal Shoulder  Pendulum with Table Support  - 3 x daily - 7 x weekly - 1 sets - 30 reps - Wrist AROM Flexion Extension  - 3 x daily - 7  x weekly - 2-3 sets - 10 reps - Seated Gripping Towel  - 2-3 x daily - 7 x weekly - 2 sets - 10 reps - Shoulder Rolls in Sitting  - 3 x daily - 7 x weekly - 1 sets - 20 reps - Elbow Flexion PROM  - 3 x daily - 7 x weekly - 1 sets - 20 reps   ASSESSMENT:  CLINICAL IMPRESSION:  Per MD note we are to begin active ER and Active FF. We began self assisted FF. He had no pain. We also began active ER without stress. With self assisted return to IR.  He tolerated well. We reviewed scap retractions as well. Over all he is doing very well. We will continue to progress as tolerated. He had no pain with treatment.   OBJECTIVE IMPAIRMENTS decreased ROM, decreased strength, hypomobility, increased edema, increased fascial restrictions, impaired flexibility, impaired UE functional use, and pain.   ACTIVITY LIMITATIONS carrying, lifting, bed mobility, bathing, toileting, dressing, self feeding, reach over head, hygiene/grooming, and caring for others  PARTICIPATION LIMITATIONS: meal prep, cleaning, laundry, interpersonal relationship, driving, shopping, community activity, and occupation  PERSONAL FACTORS Age, Past/current experiences, and Time since onset of injury/illness/exacerbation are also affecting patient's functional outcome.   REHAB POTENTIAL: Excellent  CLINICAL DECISION MAKING: Stable/uncomplicated  EVALUATION COMPLEXITY: Low   GOALS: Goals reviewed with patient? Yes  SHORT TERM GOALS: Target date: 04/11/2022  (Remove Blue Hyperlink)  L shoulder flexion PROM and AAROM to be at least 150 degrees Baseline: Goal status: INITIAL  2.  L shoulder ABD PROM to be 90 degrees  Baseline:  Goal status: INITIAL  3.  L shoulder ER PROM to be 45 degrees  Baseline:  Goal status: INITIAL  4.  Will be compliant with appropriate progressive HEP  Baseline:  Goal status:  INITIAL   LONG TERM GOALS: Target date: 05/09/2022  (Remove Blue Hyperlink)  L shoulder PROM and AROM to be full in all planes of motion Baseline:  Goal status: INITIAL  2.  MMT to be at least 4/5 in all groups L shoulder  Baseline:  Goal status: INITIAL  3.  Will be able to reach overhead without increase in pain or poor scapular mechanics  Baseline:  Goal status: INITIAL  4.  Will have returned to light gym based exercise program as appropriate dependent on progress post-op  Baseline:  Goal status: INITIAL   PLAN: PT FREQUENCY:  3x/week for first 4 weeks, then 2x/week for next 4 weeks   PT DURATION: 8 weeks  PLANNED INTERVENTIONS: Therapeutic exercises, Therapeutic activity, Neuromuscular re-education, Balance training, Gait training, Patient/Family education, Self Care, Joint mobilization, Dry Needling, Electrical stimulation, Cryotherapy, Moist heat, Taping, Ultrasound, Ionotophoresis 4mg /ml Dexamethasone, Manual therapy, and Re-evaluation  PLAN FOR NEXT SESSION:  Cont with Dr. Serena Croissant Subscapularis repair protocol with regards to biceps tenodesis.  Periscap activation without shoulder motion. Avoid passive ER.   Lorayne Bender PT DPT  03/31/22 9:55 AM

## 2022-04-03 ENCOUNTER — Encounter (HOSPITAL_BASED_OUTPATIENT_CLINIC_OR_DEPARTMENT_OTHER): Payer: Self-pay | Admitting: Physical Therapy

## 2022-04-07 ENCOUNTER — Encounter (HOSPITAL_BASED_OUTPATIENT_CLINIC_OR_DEPARTMENT_OTHER): Payer: Self-pay | Admitting: Physical Therapy

## 2022-04-07 ENCOUNTER — Ambulatory Visit (HOSPITAL_BASED_OUTPATIENT_CLINIC_OR_DEPARTMENT_OTHER): Payer: Managed Care, Other (non HMO) | Admitting: Physical Therapy

## 2022-04-07 DIAGNOSIS — R600 Localized edema: Secondary | ICD-10-CM

## 2022-04-07 DIAGNOSIS — M25512 Pain in left shoulder: Secondary | ICD-10-CM

## 2022-04-07 DIAGNOSIS — M6281 Muscle weakness (generalized): Secondary | ICD-10-CM

## 2022-04-07 DIAGNOSIS — M25612 Stiffness of left shoulder, not elsewhere classified: Secondary | ICD-10-CM

## 2022-04-07 NOTE — Therapy (Signed)
OUTPATIENT PHYSICAL THERAPY TREATMENT NOTE   Patient Name: Kent Johnson MRN: [id number] DOB:[birth year], 36 y.o., male Today's Date: 04/07/2022   PT End of Session - 04/07/22 0921     Visit Number 6    Number of Visits 22    Date for PT Re-Evaluation 05/09/22    Authorization Type [employer]    Authorization Time Period 03/14/22 to 05/09/22    PT Start Time 0845    PT Stop Time 0915    PT Time Calculation (min) 30 min    Activity Tolerance Patient tolerated treatment well    Behavior During Therapy [facility] for tasks assessed/performed                 Past Medical History:  Diagnosis Date   Anxiety    Past Surgical History:  Procedure Laterality Date   CLAVICLE SURGERY  2011   broke collar bone skiing   LIPOMA EXCISION  2007   benign lipoma in colon, caused intersepcion   SHOULDER ARTHROSCOPY WITH DEBRIDEMENT AND BICEP TENDON REPAIR Left 03/11/2022   Procedure: LEFT SHOULDER ARTHROSCOPY WITH MINI OPEN SUBSCAPULARIS REPAIR  AND BICEP TENDODESIS;  Surgeon: Huel Cote, MD;  Location: [facility];  Service: Orthopedics;  Laterality: Left;   Patient Active Problem List   Diagnosis Date Noted   Avulsion of left subscapularis    Anxiety disorder 11/11/2021   Insomnia 11/11/2021    PCP: [name], [name]   REFERRING PROVIDER: Huel Cote, MD   REFERRING DIAG: [phone] (ICD-10-CM) - Avulsion of left subscapularis, initial encounter   THERAPY DIAG:  Acute pain of left shoulder  Stiffness of left shoulder, not elsewhere classified  Muscle weakness (generalized)  Localized edema  Rationale for Evaluation and Treatment Rehabilitation  ONSET DATE: 02/03/2022, DOS 8/22  Days since surgery: 27   SUBJECTIVE:                                                                                                                                                                                      SUBJECTIVE STATEMENT: Pt is 3 week & 6 days s/p L  subscapularis repair and biceps tenodesis.    Pt states he notices discomfort based on how he sleeps or when he wakes up in the morning. Pt notices "tinges" of pain with rotational movement.   PERTINENT HISTORY: L shoulder Arthroscopic rotator cuff repair (subscapularis) and biceps tenodesis on 03/11/22 36 y.o. 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  I did discuss treatment options with him.  At this time his mechanical symptoms continue to be quite bothersome for him.  He does  have noticeable weakness in the setting of a tear of the subscapularis which is likely been going on for quite some time.  I did discuss possible treatment options.  At this time I do not feel an injection would benefit him significantly as this would not remove his focus of heterotopic ossification.  I did discuss that a formal rehab protocol could provide some benefit although that being said he does continue to experience significant mechanical symptoms associated with area of heterotopic ossification so I am less hopeful that physical therapy would provide any type of meaningful difference as his previous home exercise rotator cuff programs have led to aggravation of the symptoms.  At this time given his young age and desire to stay active, I do believe that he would benefit from a surgery to remove his focus of heterotopic ossification.  I would plan to send this for pathology as well.  I would plan to repair the subscapularis tendon in an open fashion at that time.  We did discuss that he does also have some biceps symptoms as well.  There is evidence of vital sign of subluxation on the MRI.  As result I do believe that he would benefit from a biceps tenodesis at the time of open subscapularis repair.  After discussion of his options at some length.  He has opted for surgical intervention.   PAIN:  Are you having pain? Yes: NPRS scale:  0/10 Pain location: L shoulder  Pain description: not centralized, more like a light throb  Aggravating factors: sudden jolts  Relieving factors: pain medicine, in or out of sling depending   PRECAUTIONS: Shoulder subscap protocol   WEIGHT BEARING RESTRICTIONS Yes NWB   FALLS:  Has patient fallen in last 6 months? No  LIVING ENVIRONMENT: Lives with: lives with their family Lives in: House/apartment Stairs: 5 STE B, flight U rail  Has following equipment at home:  ice machine   OCCUPATION: Engineer   PLOF: Independent, Independent with basic ADLs, Independent with gait, and Independent with transfers  PATIENT GOALS back to 100%, be pain free, get ROM back, recreational sports   OBJECTIVE:   Incision site inspected- clean dry, steri-strips in place; very faint spotting noted on pt's shirt; no active bleeding upon inspection  TODAY'S TREATMENT:   9/18   PROM 160 flexion; 95 ABD; IR to belly, no PROM ER per subscap repair  Joint mob: post and inf grade II  Prone row 2x10 Supine short lever AROM 2x10 AAROM flexion 5x Seated ER with assist back to neutral Pulley's and Dowel- scaption 2x10  9/11 PROM 140 flexion; 80 ABD; IR to belly, no PROM ER per subscap repair  Joint mob: post and inf grade II Trigger ppit release to upper trap   Seated scpa retraction to neutral 3x10  Supine active ER with self controled return to IR 2x10   Self assisted forward flexion 3x10   Last visit  PROM 140 flexion; 80 ABD; IR to belly, no PROM ER per subscap repair  Joint mob: post and inf grade II  Review of new HEP additions and expectations until cleared to start A/AROM Post and ant shoulder rolls; scap retraction, PROM elbow for full flexion and ext     PATIENT EDUCATION: Education details: sleeping posture, anatomy of condition Person educated: Patient Education method: Explanation, Demonstration, and Handouts Education comprehension: verbalized understanding and  returned demonstration   HOME EXERCISE PROGRAM: Access Code: [email or web address] URL: [email or web address] Date: 03/26/2022 Prepared by: Zebedee Iba  ASSESSMENT:  CLINICAL IMPRESSION:  Pt is nearly 4 wks today. Pt able to progress A/AROM at today's session. Pt did have pain with long lever flexion so changed to short lever in supine. Pt HEP updated today to include scapular activation exercise. Plan to continue with protocol at next. Consider light isometrics, AROM scaption in standing, and progress ABD ROM. Pt would benefit from continued skilled therapy in order to reach goals and maximize functional L UE strength and ROM for full return to PLOF. Pt advised to avoid discomfort or pain with lifting.  OBJECTIVE IMPAIRMENTS decreased ROM, decreased strength, hypomobility, increased edema, increased fascial restrictions, impaired flexibility, impaired UE functional use, and pain.   ACTIVITY LIMITATIONS carrying, lifting, bed mobility, bathing, toileting, dressing, self feeding, reach over head, hygiene/grooming, and caring for others  PARTICIPATION LIMITATIONS: meal prep, cleaning, laundry, interpersonal relationship, driving, shopping, community activity, and occupation  PERSONAL FACTORS Age, Past/current experiences, and Time since onset of injury/illness/exacerbation are also affecting patient's functional outcome.   REHAB POTENTIAL: Excellent  CLINICAL DECISION MAKING: Stable/uncomplicated  EVALUATION COMPLEXITY: Low   GOALS: Goals reviewed with patient? Yes  SHORT TERM GOALS: Target date: 04/11/2022  (Remove Blue Hyperlink)  L shoulder flexion PROM and AAROM to be at least 150 degrees Baseline: Goal status: INITIAL  2.  L shoulder ABD PROM to be 90 degrees  Baseline:  Goal status: INITIAL  3.  L shoulder ER PROM to be 45 degrees  Baseline:  Goal status: INITIAL  4.  Will be compliant with appropriate progressive HEP  Baseline:  Goal status: INITIAL   LONG  TERM GOALS: Target date: 05/09/2022  (Remove Blue Hyperlink)  L shoulder PROM and AROM to be full in all planes of motion Baseline:  Goal status: INITIAL  2.  MMT to be at least 4/5 in all groups L shoulder  Baseline:  Goal status: INITIAL  3.  Will be able to reach overhead without increase in pain or poor scapular mechanics  Baseline:  Goal status: INITIAL  4.  Will have returned to light gym based exercise program as appropriate dependent on progress post-op  Baseline:  Goal status: INITIAL   PLAN: PT FREQUENCY:  3x/week for first 4 weeks, then 2x/week for next 4 weeks   PT DURATION: 8 weeks  PLANNED INTERVENTIONS: Therapeutic exercises, Therapeutic activity, Neuromuscular re-education, Balance training, Gait training, Patient/Family education, Self Care, Joint mobilization, Dry Needling, Electrical stimulation, Cryotherapy, Moist heat, Taping, Ultrasound, Ionotophoresis 4mg /ml Dexamethasone, Manual therapy, and Re-evaluation  PLAN FOR NEXT SESSION:  Cont with Dr. Serena Croissant Subscapularis repair protocol with regards to biceps tenodesis.  Periscap activation without shoulder motion. Add shoulder isometrics at next   Zebedee Iba PT, DPT 04/07/22 9:22 AM

## 2022-04-09 ENCOUNTER — Encounter (HOSPITAL_BASED_OUTPATIENT_CLINIC_OR_DEPARTMENT_OTHER): Payer: Managed Care, Other (non HMO) | Admitting: Physical Therapy

## 2022-04-13 ENCOUNTER — Encounter: Payer: Self-pay | Admitting: Family Medicine

## 2022-04-14 ENCOUNTER — Encounter (HOSPITAL_BASED_OUTPATIENT_CLINIC_OR_DEPARTMENT_OTHER): Payer: Managed Care, Other (non HMO) | Admitting: Physical Therapy

## 2022-04-16 ENCOUNTER — Encounter (HOSPITAL_BASED_OUTPATIENT_CLINIC_OR_DEPARTMENT_OTHER): Payer: Managed Care, Other (non HMO) | Admitting: Physical Therapy

## 2022-04-17 ENCOUNTER — Ambulatory Visit (HOSPITAL_BASED_OUTPATIENT_CLINIC_OR_DEPARTMENT_OTHER): Payer: Managed Care, Other (non HMO) | Admitting: Physical Therapy

## 2022-04-17 ENCOUNTER — Encounter (HOSPITAL_BASED_OUTPATIENT_CLINIC_OR_DEPARTMENT_OTHER): Payer: Self-pay | Admitting: Physical Therapy

## 2022-04-17 DIAGNOSIS — M25512 Pain in left shoulder: Secondary | ICD-10-CM | POA: Diagnosis not present

## 2022-04-17 DIAGNOSIS — M6281 Muscle weakness (generalized): Secondary | ICD-10-CM

## 2022-04-17 DIAGNOSIS — M25612 Stiffness of left shoulder, not elsewhere classified: Secondary | ICD-10-CM

## 2022-04-17 DIAGNOSIS — R600 Localized edema: Secondary | ICD-10-CM

## 2022-04-17 NOTE — Therapy (Signed)
OUTPATIENT PHYSICAL THERAPY TREATMENT NOTE   Patient Name: Kent Johnson MRN: [id number] DOB:1986-02-09, 36 y.o., male Today's Date: 04/17/2022   PT End of Session - 04/17/22 0923     Visit Number 7    Number of Visits 22    Date for PT Re-Evaluation 05/09/22    Authorization Type [employer]    Authorization Time Period 03/14/22 to 05/09/22    PT Start Time 0850    PT Stop Time 0920    PT Time Calculation (min) 30 min    Activity Tolerance Patient tolerated treatment well    Behavior During Therapy [facility] for tasks assessed/performed                  Past Medical History:  Diagnosis Date   Anxiety    Past Surgical History:  Procedure Laterality Date   CLAVICLE SURGERY  2011   broke collar bone skiing   LIPOMA EXCISION  2007   benign lipoma in colon, caused intersepcion   SHOULDER ARTHROSCOPY WITH DEBRIDEMENT AND BICEP TENDON REPAIR Left 03/11/2022   Procedure: LEFT SHOULDER ARTHROSCOPY WITH MINI OPEN SUBSCAPULARIS REPAIR  AND BICEP TENDODESIS;  Surgeon: Huel Cote, MD;  Location: [facility];  Service: Orthopedics;  Laterality: Left;   Patient Active Problem List   Diagnosis Date Noted   Avulsion of left subscapularis    Anxiety disorder 11/11/2021   Insomnia 11/11/2021    PCP: [name], [name]   REFERRING PROVIDER: Huel Cote, MD   REFERRING DIAG: [phone] (ICD-10-CM) - Avulsion of left subscapularis, initial encounter   THERAPY DIAG:  Acute pain of left shoulder  Stiffness of left shoulder, not elsewhere classified  Muscle weakness (generalized)  Localized edema  Rationale for Evaluation and Treatment Rehabilitation  ONSET DATE: 02/03/2022, DOS 8/22  Days since surgery: 37   SUBJECTIVE:                                                                                                                                                                                      SUBJECTIVE STATEMENT: Pt is 5 wks 2 days s/p L  subscapularis repair and biceps tenodesis.    Pt states that the supine pressing motion feels there is a little bit of biceps discomfort. Otherwise, the shoulder itself feels really good.   PERTINENT HISTORY: L shoulder Arthroscopic rotator cuff repair (subscapularis) and biceps tenodesis on 03/11/22 36 y.o. 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  I did discuss treatment options with him.  At this time his mechanical symptoms continue to be quite bothersome for him.  He does have noticeable weakness  in the setting of a tear of the subscapularis which is likely been going on for quite some time.  I did discuss possible treatment options.  At this time I do not feel an injection would benefit him significantly as this would not remove his focus of heterotopic ossification.  I did discuss that a formal rehab protocol could provide some benefit although that being said he does continue to experience significant mechanical symptoms associated with area of heterotopic ossification so I am less hopeful that physical therapy would provide any type of meaningful difference as his previous home exercise rotator cuff programs have led to aggravation of the symptoms.  At this time given his young age and desire to stay active, I do believe that he would benefit from a surgery to remove his focus of heterotopic ossification.  I would plan to send this for pathology as well.  I would plan to repair the subscapularis tendon in an open fashion at that time.  We did discuss that he does also have some biceps symptoms as well.  There is evidence of vital sign of subluxation on the MRI.  As result I do believe that he would benefit from a biceps tenodesis at the time of open subscapularis repair.  After discussion of his options at some length.  He has opted for surgical intervention.   PAIN:  Are you having pain? Yes: NPRS scale:  0/10 Pain location: L shoulder  Pain description: not centralized, more like a light throb  Aggravating factors: sudden jolts  Relieving factors: pain medicine, in or out of sling depending   PRECAUTIONS: Shoulder subscap protocol   WEIGHT BEARING RESTRICTIONS Yes NWB   FALLS:  Has patient fallen in last 6 months? No  LIVING ENVIRONMENT: Lives with: lives with their family Lives in: House/apartment Stairs: 5 STE B, flight U rail  Has following equipment at home:  ice machine   OCCUPATION: Engineer   PLOF: Independent, Independent with basic ADLs, Independent with gait, and Independent with transfers  PATIENT GOALS back to 100%, be pain free, get ROM back, recreational sports   OBJECTIVE:   Incision site inspected- clean dry, steri-strips in place; very faint spotting noted on pt's shirt; no active bleeding upon inspection  TODAY'S TREATMENT:   9/28  Mild yellow, diffuse bruise at anterior shoulder  PROM 1656 flexion; 95 ABD; IR to belly, no PROM ER per subscap repair  Joint mob: post and inf grade III  Prone row 2x10 Prone T 2x10 Isometric ABD and flexion 3s 10x Supine short lever AROM 2x10 AAROM flexion 5x Seated ER with assist back to neutral Pulley's and Dowel- scaption 2x10  9/18   PROM 160 flexion; 95 ABD; IR to belly, no PROM ER per subscap repair  Joint mob: post and inf grade II  Prone row 2x10 Supine short lever AROM 2x10 AAROM flexion 5x Seated ER with assist back to neutral Pulley's and Dowel- scaption 2x10  9/11 PROM 140 flexion; 80 ABD; IR to belly, no PROM ER per subscap repair  Joint mob: post and inf grade II Trigger ppit release to upper trap   Seated scpa retraction to neutral 3x10  Supine active ER with self controled return to IR 2x10   Self assisted forward flexion 3x10   Last visit  PROM 140 flexion; 80 ABD; IR to belly, no PROM ER per subscap repair  Joint mob: post and inf grade II  Review of new HEP additions and  expectations until cleared to  start A/AROM Post and ant shoulder rolls; scap retraction, PROM elbow for full flexion and ext     PATIENT EDUCATION: Education details: sleeping posture, anatomy of condition Person educated: Patient Education method: Explanation, Demonstration, and Handouts Education comprehension: verbalized understanding and returned demonstration   HOME EXERCISE PROGRAM: Access Code: [email or web address] URL: [email or web address] Date: 03/26/2022 Prepared by: Zebedee Iba    ASSESSMENT:  CLINICAL IMPRESSION:  Pt is 5 wks today. Pt able to progress scapular stability exercise as well as adding in isometrics for home at this time. ABD ROM not progressed due to report of increased pain at the anterior shoulder. Pt without pain at today's session. Pt to see MD next week for 6 wk follow up and progression to next phase of motion. Pt doing very well at this time. Pt would benefit from continued skilled therapy in order to reach goals and maximize functional L UE strength and ROM for full return to PLOF. Pt advised to avoid discomfort or pain with lifting.  OBJECTIVE IMPAIRMENTS decreased ROM, decreased strength, hypomobility, increased edema, increased fascial restrictions, impaired flexibility, impaired UE functional use, and pain.   ACTIVITY LIMITATIONS carrying, lifting, bed mobility, bathing, toileting, dressing, self feeding, reach over head, hygiene/grooming, and caring for others  PARTICIPATION LIMITATIONS: meal prep, cleaning, laundry, interpersonal relationship, driving, shopping, community activity, and occupation  PERSONAL FACTORS Age, Past/current experiences, and Time since onset of injury/illness/exacerbation are also affecting patient's functional outcome.   REHAB POTENTIAL: Excellent  CLINICAL DECISION MAKING: Stable/uncomplicated  EVALUATION COMPLEXITY: Low   GOALS: Goals reviewed with patient? Yes  SHORT TERM GOALS: Target date: 04/11/2022   (Remove Blue Hyperlink)  L shoulder flexion PROM and AAROM to be at least 150 degrees Baseline: Goal status: INITIAL  2.  L shoulder ABD PROM to be 90 degrees  Baseline:  Goal status: INITIAL  3.  L shoulder ER PROM to be 45 degrees  Baseline:  Goal status: INITIAL  4.  Will be compliant with appropriate progressive HEP  Baseline:  Goal status: INITIAL   LONG TERM GOALS: Target date: 05/09/2022  (Remove Blue Hyperlink)  L shoulder PROM and AROM to be full in all planes of motion Baseline:  Goal status: INITIAL  2.  MMT to be at least 4/5 in all groups L shoulder  Baseline:  Goal status: INITIAL  3.  Will be able to reach overhead without increase in pain or poor scapular mechanics  Baseline:  Goal status: INITIAL  4.  Will have returned to light gym based exercise program as appropriate dependent on progress post-op  Baseline:  Goal status: INITIAL   PLAN: PT FREQUENCY:  3x/week for first 4 weeks, then 2x/week for next 4 weeks   PT DURATION: 8 weeks  PLANNED INTERVENTIONS: Therapeutic exercises, Therapeutic activity, Neuromuscular re-education, Balance training, Gait training, Patient/Family education, Self Care, Joint mobilization, Dry Needling, Electrical stimulation, Cryotherapy, Moist heat, Taping, Ultrasound, Ionotophoresis 4mg /ml Dexamethasone, Manual therapy, and Re-evaluation  PLAN FOR NEXT SESSION:  Cont with Dr. Serena Croissant Subscapularis repair protocol with regards to biceps tenodesis.  Periscap activation without shoulder motion.   Zebedee Iba PT, DPT 04/17/22 9:27 AM

## 2022-04-23 ENCOUNTER — Ambulatory Visit (HOSPITAL_BASED_OUTPATIENT_CLINIC_OR_DEPARTMENT_OTHER): Payer: Managed Care, Other (non HMO) | Attending: Orthopaedic Surgery | Admitting: Physical Therapy

## 2022-04-23 ENCOUNTER — Encounter (HOSPITAL_BASED_OUTPATIENT_CLINIC_OR_DEPARTMENT_OTHER): Payer: Self-pay | Admitting: Physical Therapy

## 2022-04-23 ENCOUNTER — Ambulatory Visit (INDEPENDENT_AMBULATORY_CARE_PROVIDER_SITE_OTHER): Payer: Managed Care, Other (non HMO) | Admitting: Orthopaedic Surgery

## 2022-04-23 DIAGNOSIS — M6281 Muscle weakness (generalized): Secondary | ICD-10-CM | POA: Diagnosis present

## 2022-04-23 DIAGNOSIS — M25512 Pain in left shoulder: Secondary | ICD-10-CM | POA: Diagnosis not present

## 2022-04-23 DIAGNOSIS — S46892A Other injury of other muscles, fascia and tendons at shoulder and upper arm level, left arm, initial encounter: Secondary | ICD-10-CM

## 2022-04-23 DIAGNOSIS — R600 Localized edema: Secondary | ICD-10-CM | POA: Diagnosis present

## 2022-04-23 DIAGNOSIS — M25612 Stiffness of left shoulder, not elsewhere classified: Secondary | ICD-10-CM | POA: Insufficient documentation

## 2022-04-23 NOTE — Progress Notes (Signed)
Post Operative Evaluation    Procedure/Date of Surgery: Left shoulder removal of loose body and subscapularis repair 03/11/22  Interval History:    Presents today for follow-up 6 weeks status post the above procedure.  Overall he is doing very well.  He is continuing to make progress with range of motion of the left shoulder.  He has been compliant with sling usage.  Only is having some tightness about the biceps at this point.  PMH/PSH/Family History/Social History/Meds/Allergies:    Past Medical History:  Diagnosis Date   Anxiety    Past Surgical History:  Procedure Laterality Date   CLAVICLE SURGERY  2011   broke collar bone skiing   LIPOMA EXCISION  2007   benign lipoma in colon, caused intersepcion   SHOULDER ARTHROSCOPY WITH DEBRIDEMENT AND BICEP TENDON REPAIR Left 03/11/2022   Procedure: LEFT SHOULDER ARTHROSCOPY WITH MINI OPEN SUBSCAPULARIS REPAIR  AND BICEP TENDODESIS;  Surgeon: Huel Cote, MD;  Location: [facility];  Service: Orthopedics;  Laterality: Left;   Social History   Socioeconomic History   Marital status: Married    Spouse name: Fleet Contras   Number of children: Not on file   Years of education: Not on file   Highest education level: Master's degree (e.g., MA, MS, MEng, MEd, MSW, MBA)  Occupational History   Not on file  Tobacco Use   Smoking status: Never   Smokeless tobacco: Never  Vaping Use   Vaping Use: Not on file  Substance and Sexual Activity   Alcohol use: Yes    Comment: occas   Drug use: Never   Sexual activity: Yes    Partners: Female  Other Topics Concern   Not on file  Social History Narrative   Not on file   Social Determinants of Health   Financial Resource Strain: Low Risk  (12/28/2021)   Overall Financial Resource Strain (CARDIA)    Difficulty of Paying Living Expenses: Not hard at all  Food Insecurity: No Food Insecurity (12/28/2021)   Hunger Vital Sign    Worried About  Running Out of Food in the Last Year: Never true    Ran Out of Food in the Last Year: Never true  Transportation Needs: No Transportation Needs (12/28/2021)   PRAPARE - Administrator, Civil Service (Medical): No    Lack of Transportation (Non-Medical): No  Physical Activity: Insufficiently Active (12/28/2021)   Exercise Vital Sign    Days of Exercise per Week: 4 days    Minutes of Exercise per Session: 30 min  Stress: No Stress Concern Present (12/28/2021)   [employer] of Occupational Health - Occupational Stress Questionnaire    Feeling of Stress : Only a little  Social Connections: Unknown (12/28/2021)   Social Connection and Isolation Panel [NHANES]    Frequency of Communication with Friends and Family: Twice a week    Frequency of Social Gatherings with Friends and Family: Once a week    Attends Religious Services: Patient refused    Database administrator or Organizations: Yes    Attends Engineer, structural: More than 4 times per year    Marital Status: Married   Family History  Problem Relation Age of Onset   Anxiety disorder Mother    Cancer Mother  melanoma   Miscarriages / [place] Mother    Asthma Brother    Arthritis Maternal Grandmother    Cancer Maternal Grandfather    Heart attack Maternal Grandfather    No Known Allergies Current Outpatient Medications  Medication Sig Dispense Refill   Ascorbic Acid (VITAMIN C) 500 MG CAPS Take by mouth.     aspirin EC 325 MG tablet Take 1 tablet (325 mg total) by mouth daily. 30 tablet 0   Cholecalciferol (VITAMIN D3) 125 MCG (5000 UT) TABS Take by mouth.     cyanocobalamin (VITAMIN B12) 500 MCG tablet Take 500 mcg by mouth daily.     escitalopram (LEXAPRO) 10 MG tablet TAKE 1 TABLET(10 MG) BY MOUTH DAILY 90 tablet 2   fexofenadine (ALLEGRA) 180 MG tablet Take 180 mg by mouth daily.     oxycodone (OXY-IR) 5 MG capsule Take 1 capsule (5 mg total) by mouth every 4 (four) hours as needed (severe  pain). 20 capsule 0   No current facility-administered medications for this visit.   No results found.  Review of Systems:   A ROS was performed including pertinent positives and negatives as documented in the HPI.   Musculoskeletal Exam:    There were no vitals taken for this visit.  Left shoulder portals are well-appearing without erythema or drainage.  Able to flex and extend at the left elbow.  He is active able to flex to 90 degrees in the supine position without pain.  External rotation at the side actively is to 30 degrees again without pain.  Internal rotation deferred to plus radial pulse distal neurosensory exam is intact  Imaging:      I personally reviewed and interpreted the radiographs.   Assessment:   6 weeks status post left shoulder removal of loose body and arthroscopic subscapularis repair overall doing extremely well.  At this time he may begin unrestricted active range of motion and he will progress through this.  He will continue to progress according to my subscapularis and biceps tenodesis rehab protocol.  All specific restrictions were discussed and counseled. Plan :    -Return to clinic in 6 weeks      I personally saw and evaluated the patient, and participated in the management and treatment plan.  Vanetta Mulders, MD Attending Physician, Orthopedic Surgery  This document was dictated using Dragon voice recognition software. A reasonable attempt at proof reading has been made to minimize errors.

## 2022-04-23 NOTE — Therapy (Signed)
OUTPATIENT PHYSICAL THERAPY TREATMENT NOTE   Patient Name: Kent Johnson MRN: [id number] DOB:02/25/1986, 36 y.o., male Today's Date: 04/23/2022   PT End of Session - 04/23/22 0946     Visit Number 8    Number of Visits 22    Date for PT Re-Evaluation 05/09/22    Authorization Type [employer]    Authorization Time Period 03/14/22 to 05/09/22    PT Start Time 0925    PT Stop Time 1005    PT Time Calculation (min) 40 min    Activity Tolerance Patient tolerated treatment well    Behavior During Therapy [facility] for tasks assessed/performed                   Past Medical History:  Diagnosis Date   Anxiety    Past Surgical History:  Procedure Laterality Date   CLAVICLE SURGERY  2011   broke collar bone skiing   LIPOMA EXCISION  2007   benign lipoma in colon, caused intersepcion   SHOULDER ARTHROSCOPY WITH DEBRIDEMENT AND BICEP TENDON REPAIR Left 03/11/2022   Procedure: LEFT SHOULDER ARTHROSCOPY WITH MINI OPEN SUBSCAPULARIS REPAIR  AND BICEP TENDODESIS;  Surgeon: Huel Cote, MD;  Location: [facility];  Service: Orthopedics;  Laterality: Left;   Patient Active Problem List   Diagnosis Date Noted   Avulsion of left subscapularis    Anxiety disorder 11/11/2021   Insomnia 11/11/2021    PCP: [name], [name]   REFERRING PROVIDER: Huel Cote, MD   REFERRING DIAG: [phone] (ICD-10-CM) - Avulsion of left subscapularis, initial encounter   THERAPY DIAG:  Acute pain of left shoulder  Stiffness of left shoulder, not elsewhere classified  Muscle weakness (generalized)  Localized edema  Rationale for Evaluation and Treatment Rehabilitation  ONSET DATE: 02/03/2022, DOS 8/22  Days since surgery: 43   SUBJECTIVE:                                                                                                                                                                                      SUBJECTIVE STATEMENT: Pt is 6 wks s/p L  subscapularis repair and biceps tenodesis.    Pt saw MD prior to this visit. D/C sling at this time and allowed to move the shoulder with no ROM restrictions. Pt states it has felt good, just weak at times.   PERTINENT HISTORY: L shoulder Arthroscopic rotator cuff repair (subscapularis) and biceps tenodesis on 03/11/22 36 y.o. 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  I did discuss treatment options with him.  At this time his mechanical symptoms continue to be quite bothersome  for him.  He does have noticeable weakness in the setting of a tear of the subscapularis which is likely been going on for quite some time.  I did discuss possible treatment options.  At this time I do not feel an injection would benefit him significantly as this would not remove his focus of heterotopic ossification.  I did discuss that a formal rehab protocol could provide some benefit although that being said he does continue to experience significant mechanical symptoms associated with area of heterotopic ossification so I am less hopeful that physical therapy would provide any type of meaningful difference as his previous home exercise rotator cuff programs have led to aggravation of the symptoms.  At this time given his young age and desire to stay active, I do believe that he would benefit from a surgery to remove his focus of heterotopic ossification.  I would plan to send this for pathology as well.  I would plan to repair the subscapularis tendon in an open fashion at that time.  We did discuss that he does also have some biceps symptoms as well.  There is evidence of vital sign of subluxation on the MRI.  As result I do believe that he would benefit from a biceps tenodesis at the time of open subscapularis repair.  After discussion of his options at some length.  He has opted for surgical intervention.   PAIN:  Are you having pain?  Yes: NPRS scale: 0/10 Pain location: L shoulder  Pain description: not centralized, more like a light throb  Aggravating factors: sudden jolts  Relieving factors: pain medicine, in or out of sling depending   PRECAUTIONS: Shoulder subscap protocol   WEIGHT BEARING RESTRICTIONS Yes NWB   FALLS:  Has patient fallen in last 6 months? No  LIVING ENVIRONMENT: Lives with: lives with their family Lives in: House/apartment Stairs: 5 STE B, flight U rail  Has following equipment at home:  ice machine   OCCUPATION: Engineer   PLOF: Independent, Independent with basic ADLs, Independent with gait, and Independent with transfers  PATIENT GOALS back to 100%, be pain free, get ROM back, recreational sports   OBJECTIVE:   Incision site inspected- clean dry, steri-strips in place; very faint spotting noted on pt's shirt; no active bleeding upon inspection  TODAY'S TREATMENT:   10/4  A/ROM 170 flexion; 135 ABD; IR to belly, ER to 45  Joint mob: post and inf grade III  Seated ER 15x Exercises - Bicep Stretch at Table  - 1 x daily - 7 x weekly - 1 sets - 30 reps - 3 hold - Shoulder Flexion Wall Slide with Towel  - 1 x daily - 7 x weekly - 1 sets - 15 reps - 3 hold - Standing Shoulder Abduction Slides at Wall  - 1 x daily - 7 x weekly - 1 sets - 15 reps - 3 hold - Sidelying Shoulder External Rotation  - 1 x daily - 4-5 x weekly - 2 sets - 10 reps - Standing Shoulder Row with Anchored Resistance  - 1 x daily - 4-5 x weekly - 3 sets - 10 reps - Seated Shoulder External Rotation AAROM with Cane and Hand in Neutral  - 1 x daily - 4-5 x weekly - 1 sets - 15 reps - 3 hold - Standing Shoulder Scaption  - 1 x daily - 4-5 x weekly - 2 sets - 10 reps  9/28  Mild yellow, diffuse bruise at anterior shoulder  PROM  165 flexion; 95 ABD; IR to belly, no PROM ER per subscap repair  Joint mob: post and inf grade III  Prone row 2x10 Prone T 2x10 Isometric ABD and flexion 3s 10x Supine short lever  AROM 2x10 AAROM flexion 5x Seated ER with assist back to neutral Pulley's and Dowel- scaption 2x10    PATIENT EDUCATION: Education details: biomechanics, periodization, anatomy, exercise progression, DOMS expectations, muscle firing,  envelope of function, HEP, POC  Person educated: Patient Education method: Explanation, Demonstration, and Handouts Education comprehension: verbalized understanding and returned demonstration   HOME EXERCISE PROGRAM: Access Code: [email or web address] URL: [email or web address] Date: 03/26/2022 Prepared by: Zebedee Iba    ASSESSMENT:  CLINICAL IMPRESSION: Pt continues to progress very well at this. Pt has near fully ROM with PROM but still has joint stiffness limiting full range. Pt did improve with joint mobilizations and able to update HEP at this time. Pt progressed resisted scapular exercise and end range stretching. No pain noted during session. Pt advised on precautions of no lifting with that arm at this point and to avoid FWB in CKC for now. Plan to progress ROM and strenght as tolerated. Pt did have most notable weakness with scaption. Pt would benefit from continued skilled therapy in order to reach goals and maximize functional L UE strength and ROM for full return to PLOF. Pt advised to avoid discomfort or pain with lifting.  OBJECTIVE IMPAIRMENTS decreased ROM, decreased strength, hypomobility, increased edema, increased fascial restrictions, impaired flexibility, impaired UE functional use, and pain.   ACTIVITY LIMITATIONS carrying, lifting, bed mobility, bathing, toileting, dressing, self feeding, reach over head, hygiene/grooming, and caring for others  PARTICIPATION LIMITATIONS: meal prep, cleaning, laundry, interpersonal relationship, driving, shopping, community activity, and occupation  PERSONAL FACTORS Age, Past/current experiences, and Time since onset of injury/illness/exacerbation are also affecting patient's functional outcome.    REHAB POTENTIAL: Excellent  CLINICAL DECISION MAKING: Stable/uncomplicated  EVALUATION COMPLEXITY: Low   GOALS: Goals reviewed with patient? Yes  SHORT TERM GOALS: Target date: 04/11/2022  (Remove Blue Hyperlink)  L shoulder flexion PROM and AAROM to be at least 150 degrees Baseline: Goal status: MET  2.  L shoulder ABD PROM to be 90 degrees  Baseline:  Goal status: MET  3.  L shoulder ER PROM to be 45 degrees  Baseline:  Goal status: MET  4.  Will be compliant with appropriate progressive HEP  Baseline:  Goal status:MET   LONG TERM GOALS: Target date: 05/09/2022  (Remove Blue Hyperlink)  L shoulder PROM and AROM to be full in all planes of motion Baseline:  Goal status: ongoing  2.  MMT to be at least 4/5 in all groups L shoulder  Baseline:  Goal status:ongoing  3.  Will be able to reach overhead without increase in pain or poor scapular mechanics  Baseline:  Goal status: ongoing  4.  Will have returned to light gym based exercise program as appropriate dependent on progress post-op  Baseline:  Goal status: ongoing   PLAN: PT FREQUENCY:  3x/week for first 4 weeks, then 2x/week for next 4 weeks   PT DURATION: 8 weeks  PLANNED INTERVENTIONS: Therapeutic exercises, Therapeutic activity, Neuromuscular re-education, Balance training, Gait training, Patient/Family education, Self Care, Joint mobilization, Dry Needling, Electrical stimulation, Cryotherapy, Moist heat, Taping, Ultrasound, Ionotophoresis 4mg /ml Dexamethasone, Manual therapy, and Re-evaluation  PLAN FOR NEXT SESSION:  Cont with Dr. Serena Croissant Subscapularis repair protocol with regards to biceps tenodesis.  Periscap activation without shoulder motion.   Zebedee Iba PT, DPT 04/23/22  10:16 AM

## 2022-04-30 ENCOUNTER — Encounter (HOSPITAL_BASED_OUTPATIENT_CLINIC_OR_DEPARTMENT_OTHER): Payer: Self-pay | Admitting: Physical Therapy

## 2022-04-30 ENCOUNTER — Ambulatory Visit (HOSPITAL_BASED_OUTPATIENT_CLINIC_OR_DEPARTMENT_OTHER): Payer: Managed Care, Other (non HMO) | Admitting: Physical Therapy

## 2022-04-30 DIAGNOSIS — R600 Localized edema: Secondary | ICD-10-CM

## 2022-04-30 DIAGNOSIS — M25612 Stiffness of left shoulder, not elsewhere classified: Secondary | ICD-10-CM

## 2022-04-30 DIAGNOSIS — M6281 Muscle weakness (generalized): Secondary | ICD-10-CM

## 2022-04-30 DIAGNOSIS — M25512 Pain in left shoulder: Secondary | ICD-10-CM | POA: Diagnosis not present

## 2022-04-30 NOTE — Therapy (Signed)
OUTPATIENT PHYSICAL THERAPY TREATMENT NOTE   Patient Name: Kent Johnson MRN: [id number] DOB:11-21-1985, 36 y.o., male Today's Date: 04/30/2022   PT End of Session - 04/30/22 1344     Visit Number 9    Number of Visits 22    Date for PT Re-Evaluation 05/09/22    Authorization Type [employer]    Authorization Time Period 03/14/22 to 05/09/22    PT Start Time 1301    PT Stop Time 1341    PT Time Calculation (min) 40 min    Activity Tolerance Patient tolerated treatment well    Behavior During Therapy [facility] for tasks assessed/performed                    Past Medical History:  Diagnosis Date   Anxiety    Past Surgical History:  Procedure Laterality Date   CLAVICLE SURGERY  2011   broke collar bone skiing   LIPOMA EXCISION  2007   benign lipoma in colon, caused intersepcion   SHOULDER ARTHROSCOPY WITH DEBRIDEMENT AND BICEP TENDON REPAIR Left 03/11/2022   Procedure: LEFT SHOULDER ARTHROSCOPY WITH MINI OPEN SUBSCAPULARIS REPAIR  AND BICEP TENDODESIS;  Surgeon: Huel Cote, MD;  Location: [facility];  Service: Orthopedics;  Laterality: Left;   Patient Active Problem List   Diagnosis Date Noted   Avulsion of left subscapularis    Anxiety disorder 11/11/2021   Insomnia 11/11/2021    PCP: [name], [name]   REFERRING PROVIDER: Huel Cote, MD   REFERRING DIAG: [phone] (ICD-10-CM) - Avulsion of left subscapularis, initial encounter   THERAPY DIAG:  Acute pain of left shoulder  Stiffness of left shoulder, not elsewhere classified  Muscle weakness (generalized)  Localized edema  Rationale for Evaluation and Treatment Rehabilitation  ONSET DATE: 02/03/2022, DOS 8/22  Days since surgery: 50   SUBJECTIVE:                                                                                                                                                                                      SUBJECTIVE STATEMENT: Pt is 7 wks s/p L  subscapularis repair and biceps tenodesis.    Pt states that the lifting was uncomfortable the first two days. After, it was totally fine.   PERTINENT HISTORY: L shoulder Arthroscopic rotator cuff repair (subscapularis) and biceps tenodesis on 03/11/22 36 y.o. 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  I did discuss treatment options with him.  At this time his mechanical symptoms continue to be quite bothersome for him.  He does have noticeable weakness in the setting of a tear of  the subscapularis which is likely been going on for quite some time.  I did discuss possible treatment options.  At this time I do not feel an injection would benefit him significantly as this would not remove his focus of heterotopic ossification.  I did discuss that a formal rehab protocol could provide some benefit although that being said he does continue to experience significant mechanical symptoms associated with area of heterotopic ossification so I am less hopeful that physical therapy would provide any type of meaningful difference as his previous home exercise rotator cuff programs have led to aggravation of the symptoms.  At this time given his young age and desire to stay active, I do believe that he would benefit from a surgery to remove his focus of heterotopic ossification.  I would plan to send this for pathology as well.  I would plan to repair the subscapularis tendon in an open fashion at that time.  We did discuss that he does also have some biceps symptoms as well.  There is evidence of vital sign of subluxation on the MRI.  As result I do believe that he would benefit from a biceps tenodesis at the time of open subscapularis repair.  After discussion of his options at some length.  He has opted for surgical intervention.   PAIN:  Are you having pain? Yes: NPRS scale: 0/10 Pain location: L shoulder  Pain description:  not centralized, more like a light throb  Aggravating factors: sudden jolts  Relieving factors: pain medicine, in or out of sling depending   PRECAUTIONS: Shoulder subscap protocol   WEIGHT BEARING RESTRICTIONS Yes NWB   FALLS:  Has patient fallen in last 6 months? No  LIVING ENVIRONMENT: Lives with: lives with their family Lives in: House/apartment Stairs: 5 STE B, flight U rail  Has following equipment at home:  ice machine   OCCUPATION: Engineer   PLOF: Independent, Independent with basic ADLs, Independent with gait, and Independent with transfers  PATIENT GOALS back to 100%, be pain free, get ROM back, recreational sports   OBJECTIVE:   Incision site inspected- clean dry, steri-strips in place; very faint spotting noted on pt's shirt; no active bleeding upon inspection  TODAY'S TREATMENT:   10/11  AROM 170 flexion; 165 ABD; IR to belly, ER to 45  Joint mob: post and inf grade III  Seated YTB ER 2x10 YTB horizontal ABD 2x10 Cane ext 5s 10x RTB row 3x10 Prone T 2lb 2x10  10/4  A/ROM 170 flexion; 135 ABD; IR to belly, ER to 45  Joint mob: post and inf grade III  Seated ER 15x Exercises - Bicep Stretch at Table  - 1 x daily - 7 x weekly - 1 sets - 30 reps - 3 hold - Shoulder Flexion Wall Slide with Towel  - 1 x daily - 7 x weekly - 1 sets - 15 reps - 3 hold - Standing Shoulder Abduction Slides at Wall  - 1 x daily - 7 x weekly - 1 sets - 15 reps - 3 hold - Sidelying Shoulder External Rotation  - 1 x daily - 4-5 x weekly - 2 sets - 10 reps - Standing Shoulder Row with Anchored Resistance  - 1 x daily - 4-5 x weekly - 3 sets - 10 reps - Seated Shoulder External Rotation AAROM with Cane and Hand in Neutral  - 1 x daily - 4-5 x weekly - 1 sets - 15 reps - 3 hold - Standing Shoulder Scaption  -  1 x daily - 4-5 x weekly - 2 sets - 10 reps  9/28  Mild yellow, diffuse bruise at anterior shoulder  PROM 165 flexion; 95 ABD; IR to belly, no PROM ER per subscap  repair  Joint mob: post and inf grade III  Prone row 2x10 Prone T 2x10 Isometric ABD and flexion 3s 10x Supine short lever AROM 2x10 AAROM flexion 5x Seated ER with assist back to neutral Pulley's and Dowel- scaption 2x10    PATIENT EDUCATION: Education details: biomechanics, periodization, anatomy, exercise progression, DOMS expectations, muscle firing,  envelope of function, HEP, POC  Person educated: Patient Education method: Explanation, Demonstration, and Handouts Education comprehension: verbalized understanding and returned demonstration   HOME EXERCISE PROGRAM: Access Code: [email or web address] URL: [email or web address] Date: 03/26/2022 Prepared by: Zebedee Iba    ASSESSMENT:  CLINICAL IMPRESSION: Pt progressing well at this time. Pt has near full AROM at this time. Pt with well controlled pain at this time. Pt able to start resisted exercise today without pain. Pt without issues or complaints today during exercise. HEP updated. Pt does have anterior shoulder/pec tightness that was relieved with STM. Pt advised to avoid resisted IR at this time. Pt will be 8 wks at next. Plan for 90/90 position stretching and light IR resistance as tolerated. Pt would benefit from continued skilled therapy in order to reach goals and maximize functional L UE strength and ROM for full return to PLOF. Pt advised to avoid discomfort or pain with lifting.  OBJECTIVE IMPAIRMENTS decreased ROM, decreased strength, hypomobility, increased edema, increased fascial restrictions, impaired flexibility, impaired UE functional use, and pain.   ACTIVITY LIMITATIONS carrying, lifting, bed mobility, bathing, toileting, dressing, self feeding, reach over head, hygiene/grooming, and caring for others  PARTICIPATION LIMITATIONS: meal prep, cleaning, laundry, interpersonal relationship, driving, shopping, community activity, and occupation  PERSONAL FACTORS Age, Past/current experiences, and Time since  onset of injury/illness/exacerbation are also affecting patient's functional outcome.   REHAB POTENTIAL: Excellent  CLINICAL DECISION MAKING: Stable/uncomplicated  EVALUATION COMPLEXITY: Low   GOALS: Goals reviewed with patient? Yes  SHORT TERM GOALS: Target date: 04/11/2022  (Remove Blue Hyperlink)  L shoulder flexion PROM and AAROM to be at least 150 degrees Baseline: Goal status: MET  2.  L shoulder ABD PROM to be 90 degrees  Baseline:  Goal status: MET  3.  L shoulder ER PROM to be 45 degrees  Baseline:  Goal status: MET  4.  Will be compliant with appropriate progressive HEP  Baseline:  Goal status:MET   LONG TERM GOALS: Target date: 05/09/2022  (Remove Blue Hyperlink)  L shoulder PROM and AROM to be full in all planes of motion Baseline:  Goal status: ongoing  2.  MMT to be at least 4/5 in all groups L shoulder  Baseline:  Goal status:ongoing  3.  Will be able to reach overhead without increase in pain or poor scapular mechanics  Baseline:  Goal status: ongoing  4.  Will have returned to light gym based exercise program as appropriate dependent on progress post-op  Baseline:  Goal status: ongoing   PLAN: PT FREQUENCY:  3x/week for first 4 weeks, then 2x/week for next 4 weeks   PT DURATION: 8 weeks  PLANNED INTERVENTIONS: Therapeutic exercises, Therapeutic activity, Neuromuscular re-education, Balance training, Gait training, Patient/Family education, Self Care, Joint mobilization, Dry Needling, Electrical stimulation, Cryotherapy, Moist heat, Taping, Ultrasound, Ionotophoresis 4mg /ml Dexamethasone, Manual therapy, and Re-evaluation  PLAN FOR NEXT SESSION:  Cont with Dr. Serena Croissant Subscapularis repair protocol with regards  to biceps tenodesis.  Periscap activation without shoulder motion.   Zebedee Iba PT, DPT 04/30/22 1:50 PM

## 2022-05-07 ENCOUNTER — Encounter (HOSPITAL_BASED_OUTPATIENT_CLINIC_OR_DEPARTMENT_OTHER): Payer: Self-pay | Admitting: Physical Therapy

## 2022-05-07 ENCOUNTER — Ambulatory Visit (HOSPITAL_BASED_OUTPATIENT_CLINIC_OR_DEPARTMENT_OTHER): Payer: Managed Care, Other (non HMO) | Admitting: Physical Therapy

## 2022-05-07 DIAGNOSIS — R600 Localized edema: Secondary | ICD-10-CM

## 2022-05-07 DIAGNOSIS — M25512 Pain in left shoulder: Secondary | ICD-10-CM | POA: Diagnosis not present

## 2022-05-07 DIAGNOSIS — M25612 Stiffness of left shoulder, not elsewhere classified: Secondary | ICD-10-CM

## 2022-05-07 DIAGNOSIS — M6281 Muscle weakness (generalized): Secondary | ICD-10-CM

## 2022-05-07 NOTE — Therapy (Signed)
OUTPATIENT PHYSICAL THERAPY TREATMENT NOTE   Patient Name: Kent Johnson MRN: [id number] DOB:Sep 22, 1985, 36 y.o., male Today's Date: 05/07/2022   PT End of Session - 05/07/22 1024     Visit Number 10    Number of Visits 24    Date for PT Re-Evaluation 08/05/22    Authorization Type [employer]    Authorization Time Period 03/14/22 to 05/09/22    PT Start Time 0930    PT Stop Time 1013    PT Time Calculation (min) 43 min    Activity Tolerance Patient tolerated treatment well    Behavior During Therapy [facility] for tasks assessed/performed                     Past Medical History:  Diagnosis Date   Anxiety    Past Surgical History:  Procedure Laterality Date   CLAVICLE SURGERY  2011   broke collar bone skiing   LIPOMA EXCISION  2007   benign lipoma in colon, caused intersepcion   SHOULDER ARTHROSCOPY WITH DEBRIDEMENT AND BICEP TENDON REPAIR Left 03/11/2022   Procedure: LEFT SHOULDER ARTHROSCOPY WITH MINI OPEN SUBSCAPULARIS REPAIR  AND BICEP TENDODESIS;  Surgeon: Huel Cote, MD;  Location: [facility];  Service: Orthopedics;  Laterality: Left;   Patient Active Problem List   Diagnosis Date Noted   Avulsion of left subscapularis    Anxiety disorder 11/11/2021   Insomnia 11/11/2021    PCP: [name], [name]   REFERRING PROVIDER: Huel Cote, MD   REFERRING DIAG: [phone] (ICD-10-CM) - Avulsion of left subscapularis, initial encounter   THERAPY DIAG:  Acute pain of left shoulder - Plan: PT plan of care cert/re-cert  Stiffness of left shoulder, not elsewhere classified - Plan: PT plan of care cert/re-cert  Muscle weakness (generalized) - Plan: PT plan of care cert/re-cert  Localized edema - Plan: PT plan of care cert/re-cert  Rationale for Evaluation and Treatment Rehabilitation  ONSET DATE: 02/03/2022, DOS 8/22  Days since surgery: 57   SUBJECTIVE:                                                                                                                                                                                       SUBJECTIVE STATEMENT: Pt is 8 wks s/p L subscapularis repair and biceps tenodesis.    Pt reports no issues with HEP. He has had no pain since last session. He has some stiffness still with lifting [place].   PERTINENT HISTORY: L shoulder Arthroscopic rotator cuff repair (subscapularis) and biceps tenodesis on 03/11/22 36 y.o. 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  I did discuss treatment options with him.  At this time his mechanical symptoms continue to be quite bothersome for him.  He does have noticeable weakness in the setting of a tear of the subscapularis which is likely been going on for quite some time.  I did discuss possible treatment options.  At this time I do not feel an injection would benefit him significantly as this would not remove his focus of heterotopic ossification.  I did discuss that a formal rehab protocol could provide some benefit although that being said he does continue to experience significant mechanical symptoms associated with area of heterotopic ossification so I am less hopeful that physical therapy would provide any type of meaningful difference as his previous home exercise rotator cuff programs have led to aggravation of the symptoms.  At this time given his young age and desire to stay active, I do believe that he would benefit from a surgery to remove his focus of heterotopic ossification.  I would plan to send this for pathology as well.  I would plan to repair the subscapularis tendon in an open fashion at that time.  We did discuss that he does also have some biceps symptoms as well.  There is evidence of vital sign of subluxation on the MRI.  As result I do believe that he would benefit from a biceps tenodesis at the time of open subscapularis repair.  After discussion of his  options at some length.  He has opted for surgical intervention.   PAIN:  Are you having pain? Yes: NPRS scale: 0/10 Pain location: L shoulder  Pain description: not centralized, more like a light throb  Aggravating factors: sudden jolts  Relieving factors: pain medicine, in or out of sling depending   PRECAUTIONS: Shoulder subscap protocol   WEIGHT BEARING RESTRICTIONS Yes NWB   FALLS:  Has patient fallen in last 6 months? No  LIVING ENVIRONMENT: Lives with: lives with their family Lives in: House/apartment Stairs: 5 STE B, flight U rail  Has following equipment at home:  ice machine   OCCUPATION: Engineer   PLOF: Independent, Independent with basic ADLs, Independent with gait, and Independent with transfers  PATIENT GOALS back to 100%, be pain free, get ROM back, recreational sports   OBJECTIVE:   UPPER EXTREMITY ROM:    AROM Left 10/18  Shoulder flexion 155  Shoulder extension 35   Shoulder abduction 140  Shoulder adduction    Shoulder internal rotation To belly  Shoulder external rotation 40  (Blank rows = not tested)   UPPER EXTREMITY MMT: not tested at this time given surgical precautions but given ability to lift against resistance, at least 4-/5   Joint mobility: mild stiffness into inferior and anterior directions, scapular tipping with passive IR in 90/90 position  TODAY'S TREATMENT:   10/18 L  GHJ inf,post, and ant grade IV joint mob  Program Notes Hand behind back, tie band to post, wrap through elbow and rotate to stretch 5s 10x  Exercises - GTB Shoulder Row with Anchored Resistance  - 1 x daily - 2-3 x weekly - 3 sets - 10 reps - RTB External Rotation and Scapular Retraction with Resistance  - 1 x daily - 2-3 x weekly - 3 sets - 10 reps - Standing Shoulder Extension with Dowel  - 1 x daily - 2-3 x weekly - 2 sets - 10 reps - 5 hold - RTB Shoulder Horizontal Abduction with Resistance  - 1 x daily - 2-3 x  weekly - 3 sets - 10 reps - RTB Internal  Rotation Reactive Isometrics  - 1 x daily - 2-3 x weekly - 2 sets - 10 reps - scaption 2lb   - 1 x daily - 2-3 x weekly - 2 sets 8 reps - Quadruped Alternating Arm Lift  - 1 x daily - 2-3 x weekly - 2 sets - 20 reps  10/11  AROM 170 flexion; 165 ABD; IR to belly, ER to 45  Joint mob: post and inf grade III  Seated RTB ER 2x10 YTB horizontal ABD 2x10 Cane ext 5s 10x RTB row 3x10 Prone T 2lb 2x10  10/4  A/ROM 170 flexion; 135 ABD; IR to belly, ER to 45  Joint mob: post and inf grade III  Seated ER 15x Exercises - Bicep Stretch at Table  - 1 x daily - 7 x weekly - 1 sets - 30 reps - 3 hold - Shoulder Flexion Wall Slide with Towel  - 1 x daily - 7 x weekly - 1 sets - 15 reps - 3 hold - Standing Shoulder Abduction Slides at Wall  - 1 x daily - 7 x weekly - 1 sets - 15 reps - 3 hold - Sidelying Shoulder External Rotation  - 1 x daily - 4-5 x weekly - 2 sets - 10 reps - Standing Shoulder Row with Anchored Resistance  - 1 x daily - 4-5 x weekly - 3 sets - 10 reps - Seated Shoulder External Rotation AAROM with Cane and Hand in Neutral  - 1 x daily - 4-5 x weekly - 1 sets - 15 reps - 3 hold - Standing Shoulder Scaption  - 1 x daily - 4-5 x weekly - 2 sets - 10 reps  9/28  Mild yellow, diffuse bruise at anterior shoulder  PROM 165 flexion; 95 ABD; IR to belly, no PROM ER per subscap repair  Joint mob: post and inf grade III  Prone row 2x10 Prone T 2x10 Isometric ABD and flexion 3s 10x Supine short lever AROM 2x10 AAROM flexion 5x Seated ER with assist back to neutral Pulley's and Dowel- scaption 2x10    PATIENT EDUCATION: Education details: biomechanics, periodization, anatomy, exercise progression, DOMS expectations, muscle firing,  envelope of function, HEP, POC  Person educated: Patient Education method: Explanation, Demonstration, and Handouts Education comprehension: verbalized understanding and returned demonstration   HOME EXERCISE PROGRAM: Access Code:  [email or web address] URL: [email or web address] Date: 03/26/2022 Prepared by: Zebedee Iba    ASSESSMENT:  CLINICAL IMPRESSION: Pt is 8 wks at this time and able to continue with progression of strengthening. Pt with great progression in ROM and strength at this time. Pt able to start reactive isometrics into IR today without pain or discomfort as well as more ER stretching in abducted position today. Pt with expected fatigue but no discomfort with any particular position. Scapular tipping likely due to lack of combined IR and ext ROM. HEP updated. Plan to continue with strengthening as tolerated at next. Pt would benefit from continued skilled therapy in order to reach goals and maximize functional L UE strength and ROM for full return to PLOF. Pt advised to avoid discomfort or pain with lifting.  OBJECTIVE IMPAIRMENTS decreased ROM, decreased strength, hypomobility, increased edema, increased fascial restrictions, impaired flexibility, impaired UE functional use, and pain.   ACTIVITY LIMITATIONS carrying, lifting, bed mobility, bathing, toileting, dressing, self feeding, reach over head, hygiene/grooming, and caring for others  PARTICIPATION LIMITATIONS: meal prep, cleaning, laundry, interpersonal relationship, driving, shopping, community  activity, and occupation  PERSONAL FACTORS Age, Past/current experiences, and Time since onset of injury/illness/exacerbation are also affecting patient's functional outcome.   REHAB POTENTIAL: Excellent  CLINICAL DECISION MAKING: Stable/uncomplicated  EVALUATION COMPLEXITY: Low   GOALS: Goals reviewed with patient? Yes  SHORT TERM GOALS: Target date: 04/11/2022  (Remove Blue Hyperlink)  L shoulder flexion PROM and AAROM to be at least 150 degrees Baseline: Goal status: MET  2.  L shoulder ABD PROM to be 90 degrees  Baseline:  Goal status: MET  3.  L shoulder ER PROM to be 45 degrees  Baseline:  Goal status: MET  4.  Will be compliant  with appropriate progressive HEP  Baseline:  Goal status:MET   LONG TERM GOALS: Target date: 07/30/2022   L shoulder PROM and AROM to be full in all planes of motion Baseline:  Goal status: ongoing  2.  MMT to be at least 4/5 in all groups L shoulder  Baseline:  Goal status:MET  3.  Will be able to reach overhead without increase in pain or poor scapular mechanics  Baseline:  Goal status: MET  4.  Will have returned to light gym based exercise program as appropriate dependent on progress post-op  Baseline:  Goal status: ongoing  5. Pt will be able to reach [facility] and carry/hold >10 lbs in order to demonstrate functional improvement in L UE strength for return to PLOF and exercise.    Goal status: new   6.Pt will be able to demonstrate full golf swing without pain  in order to demonstrate functional improvement in UE/LE function for self-care and house hold duties.    Goal status: new PLAN: PT FREQUENCY: 1x/week  PT DURATION: 12 wks  PLANNED INTERVENTIONS: Therapeutic exercises, Therapeutic activity, Neuromuscular re-education, Balance training, Gait training, Patient/Family education, Self Care, Joint mobilization, Dry Needling, Electrical stimulation, Cryotherapy, Moist heat, Taping, Ultrasound, Ionotophoresis 4mg /ml Dexamethasone, Manual therapy, and Re-evaluation  PLAN FOR NEXT SESSION:  Cont with Dr. Serena Croissant Subscapularis repair protocol with regards to biceps tenodesis.  Zebedee Iba PT, DPT 05/07/22 10:32 AM

## 2022-05-12 ENCOUNTER — Encounter (HOSPITAL_BASED_OUTPATIENT_CLINIC_OR_DEPARTMENT_OTHER): Payer: Self-pay | Admitting: Physical Therapy

## 2022-05-12 ENCOUNTER — Ambulatory Visit (HOSPITAL_BASED_OUTPATIENT_CLINIC_OR_DEPARTMENT_OTHER): Payer: Managed Care, Other (non HMO) | Admitting: Physical Therapy

## 2022-05-12 DIAGNOSIS — M25512 Pain in left shoulder: Secondary | ICD-10-CM

## 2022-05-12 DIAGNOSIS — M6281 Muscle weakness (generalized): Secondary | ICD-10-CM

## 2022-05-12 DIAGNOSIS — M25612 Stiffness of left shoulder, not elsewhere classified: Secondary | ICD-10-CM

## 2022-05-12 DIAGNOSIS — R600 Localized edema: Secondary | ICD-10-CM

## 2022-05-12 NOTE — Therapy (Addendum)
OUTPATIENT PHYSICAL THERAPY TREATMENT NOTE   Patient Name: Kent Johnson MRN: [id number] DOB:11-12-1985, 36 y.o., male Today's Date: 05/12/2022   PT End of Session - 05/12/22 1105     Visit Number 11    Number of Visits 24    Date for PT Re-Evaluation 08/05/22    Authorization Type [employer]    Authorization Time Period 03/14/22 to 05/09/22    PT Start Time 1102    PT Stop Time 1140    PT Time Calculation (min) 38 min    Activity Tolerance Patient tolerated treatment well    Behavior During Therapy [facility] for tasks assessed/performed                     Past Medical History:  Diagnosis Date   Anxiety    Past Surgical History:  Procedure Laterality Date   CLAVICLE SURGERY  2011   broke collar bone skiing   LIPOMA EXCISION  2007   benign lipoma in colon, caused intersepcion   SHOULDER ARTHROSCOPY WITH DEBRIDEMENT AND BICEP TENDON REPAIR Left 03/11/2022   Procedure: LEFT SHOULDER ARTHROSCOPY WITH MINI OPEN SUBSCAPULARIS REPAIR  AND BICEP TENDODESIS;  Surgeon: Vanetta Mulders, MD;  Location: [place];  Service: Orthopedics;  Laterality: Left;   Patient Active Problem List   Diagnosis Date Noted   Avulsion of left subscapularis    Anxiety disorder 11/11/2021   Insomnia 11/11/2021    PCP: Martinique, [name]   REFERRING PROVIDER: Vanetta Mulders, MD   REFERRING DIAG: [phone] (ICD-10-CM) - Avulsion of left subscapularis, initial encounter   THERAPY DIAG:  Acute pain of left shoulder  Stiffness of left shoulder, not elsewhere classified  Muscle weakness (generalized)  Localized edema  Rationale for Evaluation and Treatment Rehabilitation  ONSET DATE: 02/03/2022, DOS 8/22  Days since surgery: 62   SUBJECTIVE:                                                                                                                                                                                      SUBJECTIVE STATEMENT: Pt is nearly 9 wks s/p  L subscapularis repair and biceps tenodesis.    Pt reports no issues with HEP. He has had no pain since last session. He feels the reactive IR stepping is the most taxing.   PERTINENT HISTORY: L shoulder Arthroscopic rotator cuff repair (subscapularis) and biceps tenodesis on 03/11/22 36 y.o. 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  I did discuss treatment options with him.  At this time his mechanical symptoms continue to be quite bothersome for him.  He does  OUTPATIENT PHYSICAL THERAPY TREATMENT NOTE   Patient Name: Kent Johnson MRN: [id number] DOB:11-12-1985, 36 y.o., male Today's Date: 05/12/2022   PT End of Session - 05/12/22 1105     Visit Number 11    Number of Visits 24    Date for PT Re-Evaluation 08/05/22    Authorization Type [employer]    Authorization Time Period 03/14/22 to 05/09/22    PT Start Time 1102    PT Stop Time 1140    PT Time Calculation (min) 38 min    Activity Tolerance Patient tolerated treatment well    Behavior During Therapy [facility] for tasks assessed/performed                     Past Medical History:  Diagnosis Date   Anxiety    Past Surgical History:  Procedure Laterality Date   CLAVICLE SURGERY  2011   broke collar bone skiing   LIPOMA EXCISION  2007   benign lipoma in colon, caused intersepcion   SHOULDER ARTHROSCOPY WITH DEBRIDEMENT AND BICEP TENDON REPAIR Left 03/11/2022   Procedure: LEFT SHOULDER ARTHROSCOPY WITH MINI OPEN SUBSCAPULARIS REPAIR  AND BICEP TENDODESIS;  Surgeon: Vanetta Mulders, MD;  Location: [place];  Service: Orthopedics;  Laterality: Left;   Patient Active Problem List   Diagnosis Date Noted   Avulsion of left subscapularis    Anxiety disorder 11/11/2021   Insomnia 11/11/2021    PCP: [place], [name]   REFERRING PROVIDER: Vanetta Mulders, MD   REFERRING DIAG: [phone] (ICD-10-CM) - Avulsion of left subscapularis, initial encounter   THERAPY DIAG:  Acute pain of left shoulder  Stiffness of left shoulder, not elsewhere classified  Muscle weakness (generalized)  Localized edema  Rationale for Evaluation and Treatment Rehabilitation  ONSET DATE: 02/03/2022, DOS 8/22  Days since surgery: 62   SUBJECTIVE:                                                                                                                                                                                      SUBJECTIVE STATEMENT: Pt is nearly 9 wks s/p  L subscapularis repair and biceps tenodesis.    Pt reports no issues with HEP. He has had no pain since last session. He feels the reactive IR stepping is the most taxing.   PERTINENT HISTORY: L shoulder Arthroscopic rotator cuff repair (subscapularis) and biceps tenodesis on 03/11/22 36 y.o. 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  I did discuss treatment options with him.  At this time his mechanical symptoms continue to be quite bothersome for him.  He does  OUTPATIENT PHYSICAL THERAPY TREATMENT NOTE   Patient Name: Kent Johnson MRN: [id number] DOB:11-12-1985, 36 y.o., male Today's Date: 05/12/2022   PT End of Session - 05/12/22 1105     Visit Number 11    Number of Visits 24    Date for PT Re-Evaluation 08/05/22    Authorization Type [employer]    Authorization Time Period 03/14/22 to 05/09/22    PT Start Time 1102    PT Stop Time 1140    PT Time Calculation (min) 38 min    Activity Tolerance Patient tolerated treatment well    Behavior During Therapy [facility] for tasks assessed/performed                     Past Medical History:  Diagnosis Date   Anxiety    Past Surgical History:  Procedure Laterality Date   CLAVICLE SURGERY  2011   broke collar bone skiing   LIPOMA EXCISION  2007   benign lipoma in colon, caused intersepcion   SHOULDER ARTHROSCOPY WITH DEBRIDEMENT AND BICEP TENDON REPAIR Left 03/11/2022   Procedure: LEFT SHOULDER ARTHROSCOPY WITH MINI OPEN SUBSCAPULARIS REPAIR  AND BICEP TENDODESIS;  Surgeon: Vanetta Mulders, MD;  Location: [place];  Service: Orthopedics;  Laterality: Left;   Patient Active Problem List   Diagnosis Date Noted   Avulsion of left subscapularis    Anxiety disorder 11/11/2021   Insomnia 11/11/2021    PCP: [place], [name]   REFERRING PROVIDER: Vanetta Mulders, MD   REFERRING DIAG: [phone] (ICD-10-CM) - Avulsion of left subscapularis, initial encounter   THERAPY DIAG:  Acute pain of left shoulder  Stiffness of left shoulder, not elsewhere classified  Muscle weakness (generalized)  Localized edema  Rationale for Evaluation and Treatment Rehabilitation  ONSET DATE: 02/03/2022, DOS 8/22  Days since surgery: 62   SUBJECTIVE:                                                                                                                                                                                      SUBJECTIVE STATEMENT: Pt is nearly 9 wks s/p  L subscapularis repair and biceps tenodesis.    Pt reports no issues with HEP. He has had no pain since last session. He feels the reactive IR stepping is the most taxing.   PERTINENT HISTORY: L shoulder Arthroscopic rotator cuff repair (subscapularis) and biceps tenodesis on 03/11/22 36 y.o. 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  I did discuss treatment options with him.  At this time his mechanical symptoms continue to be quite bothersome for him.  He does  the biceps tendon with AROM IR and bent over rowing that appeared consistent with ant deltoid and biceps tightness. Pt continues to make good progress today with strength and joint mobility. Pt is still limited with full OH without overpressure. Pt advised on potential for irritation with increase in lifting, use his discretion for self massage as well as inflammation management. Pt would benefit from continued skilled therapy in order to reach goals and maximize functional L UE strength and ROM for full return to PLOF. Pt advised to avoid discomfort or pain with lifting.  OBJECTIVE IMPAIRMENTS decreased ROM, decreased strength, hypomobility, increased edema, increased fascial restrictions, impaired flexibility, impaired UE functional use, and pain.   ACTIVITY LIMITATIONS carrying, lifting, bed mobility, bathing, toileting, dressing, self feeding, reach over head, hygiene/grooming, and caring for others  PARTICIPATION LIMITATIONS: meal prep, cleaning, laundry, interpersonal relationship, driving, shopping, community activity, and occupation  PERSONAL FACTORS Age, Past/current experiences, and Time since onset  of injury/illness/exacerbation are also affecting patient's functional outcome.   REHAB POTENTIAL: Excellent  CLINICAL DECISION MAKING: Stable/uncomplicated  EVALUATION COMPLEXITY: Low   GOALS: Goals reviewed with patient? Yes  SHORT TERM GOALS: Target date: 04/11/2022  (Remove Blue Hyperlink)  L shoulder flexion PROM and AAROM to be at least 150 degrees Baseline: Goal status: MET  2.  L shoulder ABD PROM to be 90 degrees  Baseline:  Goal status: MET  3.  L shoulder ER PROM to be 45 degrees  Baseline:  Goal status: MET  4.  Will be compliant with appropriate progressive HEP  Baseline:  Goal status:MET   LONG TERM GOALS: Target date: 08/04/2022   L shoulder PROM and AROM to be full in all planes of motion Baseline:  Goal status: ongoing  2.  MMT to be at least 4/5 in all groups L shoulder  Baseline:  Goal status:MET  3.  Will be able to reach overhead without increase in pain or poor scapular mechanics  Baseline:  Goal status: MET  4.  Will have returned to light gym based exercise program as appropriate dependent on progress post-op  Baseline:  Goal status: ongoing  5. Pt will be able to reach [facility] and carry/hold >10 lbs in order to demonstrate functional improvement in L UE strength for return to PLOF and exercise.    Goal status: new   6.Pt will be able to demonstrate full golf swing without pain  in order to demonstrate functional improvement in UE/LE function for self-care and house hold duties.    Goal status: new PLAN: PT FREQUENCY: 1x/week  PT DURATION: 12 wks  PLANNED INTERVENTIONS: Therapeutic exercises, Therapeutic activity, Neuromuscular re-education, Balance training, Gait training, Patient/Family education, Self Care, Joint mobilization, Dry Needling, Electrical stimulation, Cryotherapy, Moist heat, Taping, Ultrasound, Ionotophoresis 4mg /ml Dexamethasone, Manual therapy, and Re-evaluation  PLAN FOR NEXT SESSION:  Cont with Dr. Serena Croissant  Subscapularis repair protocol with regards to biceps tenodesis.  Zebedee Iba PT, DPT 05/12/22 12:07 PM

## 2022-05-16 ENCOUNTER — Encounter (HOSPITAL_COMMUNITY): Payer: Self-pay | Admitting: Surgery

## 2022-05-16 NOTE — Progress Notes (Signed)
Please place orders in epic. 

## 2022-05-16 NOTE — Progress Notes (Signed)
PCP -  Cardiologist -   PPM/ICD -  Device Orders -  Rep Notified -   Chest x-ray -  EKG -  Stress Test -  ECHO -  Cardiac Cath -   Sleep Study -  CPAP -   Fasting Blood Sugar -  Checks Blood Sugar _____ times a day  Blood Thinner Instructions: Aspirin Instructions:  ERAS Protcol - PRE-SURGERY Ensure or G2-   COVID TEST-  COVID vaccine -  Activity-- Anesthesia review:   Patient denies shortness of breath, fever, cough and chest pain at PAT appointment   All instructions explained to the patient, with a verbal understanding of the material. Patient agrees to go over the instructions while at home for a better understanding. Patient also instructed to self quarantine after being tested for COVID-19. The opportunity to ask questions was provided.   

## 2022-05-18 ENCOUNTER — Encounter (HOSPITAL_COMMUNITY): Payer: Self-pay | Admitting: Surgery

## 2022-05-18 DIAGNOSIS — S30851A Superficial foreign body of abdominal wall, initial encounter: Secondary | ICD-10-CM | POA: Diagnosis present

## 2022-05-18 NOTE — H&P (Signed)
REFERRING PHYSICIAN: Berneta Johnson  PROVIDER: Daxton Nydam Myra Rude, MD   Chief Complaint: New Consultation (Foreign body at site of hernia repair)  History of Present Illness:  Patient is referred by Dr. June Johnson from dermatology for surgical evaluation and management of a recurrent infection secondary to foreign body at the site of previous incisional hernia repair. Patient had undergone a laparotomy in about 2007 in [place] for intussusception. He was found to have a large lipoma. Patient subsequently developed an incisional hernia at the level of the umbilicus. He underwent repair with mesh about 2009. Patient has developed intermittent episodes of pain and drainage at the site of the surgical wound at the umbilicus. Patient states this occurs about every 3 to 4 weeks. There is drainage of a white discharge which eventually dries up until it recurs again a few weeks later. Patient denies any other abdominal pain. He has a normal appetite. He is having normal bowel movements. Patient presents today for surgical assessment.  Review of Systems: A complete review of systems was obtained from the patient. I have reviewed this information and discussed as appropriate with the patient. See HPI as well for other ROS.  Review of Systems  Constitutional: Negative.  HENT: Negative.  Eyes: Negative.  Respiratory: Negative.  Cardiovascular: Negative.  Gastrointestinal: Negative.  Genitourinary: Negative.  Musculoskeletal: Negative.  Skin:  Recurrent infection, drainage at umbilicus  Neurological: Negative.  Endo/Heme/Allergies: Negative.  Psychiatric/Behavioral: Negative.    Medical History: History reviewed. No pertinent past medical history.  Patient Active Problem List  Diagnosis  History of incisional hernia repair  Foreign body of abdominal wall with infection   Past Surgical History:  Procedure Laterality Date  LAPAROSCOPIC COLON RESECTION 2007    No  Known Allergies  Current Outpatient Medications on File Prior to Visit  Medication Sig Dispense Refill  escitalopram oxalate (LEXAPRO) 10 MG tablet Take 10 mg by mouth once daily  ascorbic acid, vitamin C, 500 mg Cap Take by mouth  cholecalciferol, vitamin D3, (VITAMIN D3) 125 mcg (5,000 unit) tablet Take by mouth   No current facility-administered medications on file prior to visit.   Family History  Problem Relation Age of Onset  Skin cancer Mother    Social History   Tobacco Use  Smoking Status Never  Smokeless Tobacco Never    Social History   Socioeconomic History  Marital status: Married  Tobacco Use  Smoking status: Never  Smokeless tobacco: Never  Substance and Sexual Activity  Alcohol use: Yes  Comment: weekly  Drug use: Never   Objective:   Vitals:  BP: 126/72  Pulse: 72  Temp: 36.5 C (97.7 F)  SpO2: 96%  Weight: 83.7 kg (184 lb 9.6 oz)  Height: 180.3 cm (5\' 11" )   Body mass index is 25.75 kg/m.  Physical Exam   GENERAL APPEARANCE Comfortable, no acute issues Development: normal Gross deformities: none  SKIN Rash, lesions, ulcers: none Induration, erythema: none Nodules: none palpable  EYES Conjunctiva and lids: normal Pupils: equal and reactive  EARS, NOSE, MOUTH, THROAT External ears: no lesion or deformity External nose: no lesion or deformity Hearing: grossly normal  NECK Symmetric: yes Trachea: midline Thyroid: no palpable nodules in the thyroid bed  CHEST Respiratory effort: normal Retraction or accessory muscle use: no Breath sounds: normal bilaterally Rales, rhonchi, wheeze: none  CARDIOVASCULAR Auscultation: regular rhythm, normal rate Murmurs: none Pulses: radial pulse 2+ palpable Lower extremity edema: none  ABDOMEN There is a well-healed midline  abdominal scar. At the level of the umbilicus, there is some distortion of the anatomy and there is palpable suture material and probable prosthetic mesh beneath the  umbilicus. With Valsalva there is no sign of recurrent hernia. There are 2 small punctate areas just to the right of midline. These appear to have underlying chronic infection with mild hyperemia. There is palpable suture material present. There is no fluctuance. There is no drainage at this time.  GENITOURINARY/RECTAL Not assessed  MUSCULOSKELETAL Station and gait: normal Digits and nails: no clubbing or cyanosis Muscle strength: grossly normal all extremities Range of motion: grossly normal all extremities Deformity: none  LYMPHATIC Cervical: none palpable Supraclavicular: none palpable  PSYCHIATRIC Oriented to person, place, and time: yes Mood and affect: normal for situation Judgment and insight: appropriate for situation   Assessment and Plan:   History of incisional hernia repair  Foreign body of abdominal wall with infection, initial encounter  Patient presents today on referral from his dermatologist with a chronic wound and intermittent infection at the level of the umbilicus having undergone prior incisional hernia repair with mesh. I suspect that there is foreign body beneath these 2 small cutaneous lesions. This is most likely suture material. It does not appear that the patient has a mesh infection.  I have recommended that we excised the 2 small sinus tracts in the skin and remove the underlying suture material. I would then try to close the wound primarily. I have recommended doing this under sedation and local anesthesia as an outpatient surgical procedure. The patient understands and agrees to proceed in the near future. We will make arrangements at a time convenient for the patient.   Kent Gemma, MD [facility] A [place] practice Office: [phone]

## 2022-05-19 ENCOUNTER — Ambulatory Visit (HOSPITAL_BASED_OUTPATIENT_CLINIC_OR_DEPARTMENT_OTHER): Payer: Managed Care, Other (non HMO) | Admitting: Physical Therapy

## 2022-05-19 ENCOUNTER — Encounter (HOSPITAL_BASED_OUTPATIENT_CLINIC_OR_DEPARTMENT_OTHER): Payer: Self-pay | Admitting: Physical Therapy

## 2022-05-19 ENCOUNTER — Ambulatory Visit: Payer: Self-pay | Admitting: Surgery

## 2022-05-19 DIAGNOSIS — M25512 Pain in left shoulder: Secondary | ICD-10-CM | POA: Diagnosis not present

## 2022-05-19 DIAGNOSIS — M6281 Muscle weakness (generalized): Secondary | ICD-10-CM

## 2022-05-19 DIAGNOSIS — R600 Localized edema: Secondary | ICD-10-CM

## 2022-05-19 DIAGNOSIS — M25612 Stiffness of left shoulder, not elsewhere classified: Secondary | ICD-10-CM

## 2022-05-19 NOTE — Therapy (Signed)
OUTPATIENT PHYSICAL THERAPY TREATMENT NOTE   Patient Name: Kent Johnson MRN: [id number] DOB:January 11, 1986, 36 y.o., male Today's Date: 05/19/2022   PT End of Session - 05/19/22 0930     Visit Number 12    Number of Visits 24    Date for PT Re-Evaluation 08/05/22    Authorization Type [employer]    Authorization Time Period 03/14/22 to 05/09/22    PT Start Time 0930    PT Stop Time 1010    PT Time Calculation (min) 40 min    Activity Tolerance Patient tolerated treatment well    Behavior During Therapy [facility] for tasks assessed/performed                     Past Medical History:  Diagnosis Date   Anxiety    Past Surgical History:  Procedure Laterality Date   CLAVICLE SURGERY  2011   broke collar bone skiing   LIPOMA EXCISION  2007   benign lipoma in colon, caused intersepcion   SHOULDER ARTHROSCOPY WITH DEBRIDEMENT AND BICEP TENDON REPAIR Left 03/11/2022   Procedure: LEFT SHOULDER ARTHROSCOPY WITH MINI OPEN SUBSCAPULARIS REPAIR  AND BICEP TENDODESIS;  Surgeon: Huel Cote, MD;  Location: [facility];  Service: Orthopedics;  Laterality: Left;   Patient Active Problem List   Diagnosis Date Noted   Foreign body in anterior abdominal wall 05/18/2022   Avulsion of left subscapularis    Anxiety disorder 11/11/2021   Insomnia 11/11/2021    PCP: [name], [name]   REFERRING PROVIDER: Huel Cote, MD   REFERRING DIAG: [phone] (ICD-10-CM) - Avulsion of left subscapularis, initial encounter   THERAPY DIAG:  Acute pain of left shoulder  Stiffness of left shoulder, not elsewhere classified  Muscle weakness (generalized)  Localized edema  Rationale for Evaluation and Treatment Rehabilitation  ONSET DATE: 02/03/2022, DOS 8/22  Days since surgery: 69   SUBJECTIVE:                                                                                                                                                                                       SUBJECTIVE STATEMENT: Pt is nearly 10 wks s/p L subscapularis repair and biceps tenodesis.    Pt reports no issues with HEP. He has had no pain since last session. He feels that the shoulder is able to move better and stretch further. He has been busy with travelling.   PERTINENT HISTORY: L shoulder Arthroscopic rotator cuff repair (subscapularis) and biceps tenodesis on 03/11/22 36 y.o. 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  I did discuss treatment options with  him.  At this time his mechanical symptoms continue to be quite bothersome for him.  He does have noticeable weakness in the setting of a tear of the subscapularis which is likely been going on for quite some time.  I did discuss possible treatment options.  At this time I do not feel an injection would benefit him significantly as this would not remove his focus of heterotopic ossification.  I did discuss that a formal rehab protocol could provide some benefit although that being said he does continue to experience significant mechanical symptoms associated with area of heterotopic ossification so I am less hopeful that physical therapy would provide any type of meaningful difference as his previous home exercise rotator cuff programs have led to aggravation of the symptoms.  At this time given his young age and desire to stay active, I do believe that he would benefit from a surgery to remove his focus of heterotopic ossification.  I would plan to send this for pathology as well.  I would plan to repair the subscapularis tendon in an open fashion at that time.  We did discuss that he does also have some biceps symptoms as well.  There is evidence of vital sign of subluxation on the MRI.  As result I do believe that he would benefit from a biceps tenodesis at the time of open subscapularis repair.  After discussion of his options at some length.  He  has opted for surgical intervention.   PAIN:  Are you having pain? Yes: NPRS scale: 0/10 Pain location: L shoulder  Pain description: not centralized, more like a light throb  Aggravating factors: sudden jolts  Relieving factors: pain medicine, in or out of sling depending   PRECAUTIONS: Shoulder subscap protocol   WEIGHT BEARING RESTRICTIONS Yes NWB   FALLS:  Has patient fallen in last 6 months? No  LIVING ENVIRONMENT: Lives with: lives with their family Lives in: House/apartment Stairs: 5 STE B, flight U rail  Has following equipment at home:  ice machine   OCCUPATION: Engineer   PLOF: Independent, Independent with basic ADLs, Independent with gait, and Independent with transfers  PATIENT GOALS back to 100%, be pain free, get ROM back, recreational sports   OBJECTIVE:   UPPER EXTREMITY ROM:    AROM Left 10/18  Shoulder flexion 155  Shoulder extension 35   Shoulder abduction 140  Shoulder adduction    Shoulder internal rotation To belly  Shoulder external rotation 40  (Blank rows = not tested)    TODAY'S TREATMENT:   10/30  UBE L1 2 mins retro and fwd Program Notes Hand behind back, tie band to post, wrap through elbow and rotate to stretch 5s 10xBanded OH lat stretch and banded pec stretch 5s 10x  Exercises -straight arm scap retraction with heavy green band 3x10 circles CW and CCW -triceps extension rope 17.5lbs 3x10 -chest height row/pull 12.5 lbs 3x8 -standing cable row 17.5lbs 3x8 -bicep curls 3x8 -wall push up 3x8   10/23 L  GHJ ant grade IV joint mob   Program Notes Hand behind back, tie band to post, wrap through elbow and rotate to stretch 5s 10xBanded OH lat stretch and banded pec stretch 5s 10x  Exercises - Single Arm Doorway Pec Stretch at 60 Elevation  - 2 x daily - 7 x weekly - 1 sets - 3 reps - 30 hold - Shoulder External Rotation and Scapular Retraction with Resistance  - 1 x daily - 2 x weekly - 3  sets - 10 reps - Standing  Shoulder Horizontal Abduction with Resistance  - 1 x daily - 2 x weekly - 3 sets - 10 reps - Scaption with Dumbbells  - 1 x daily - 2 x weekly - 2-3 sets - 8 reps - Standing Bent Over Single Arm Scapular Row with Table Support  - 1 x daily - 2 x weekly - 3 sets - 8 reps - Full Plank  - 1 x daily - 2 x weekly - 1 sets - 4 reps - 20s hold - Shoulder Internal Rotation with Resistance  - 1 x daily - 2 x weekly - 2 sets - 10 reps   PATIENT EDUCATION: Education details: biomechanics, periodization, anatomy, exercise progression, DOMS expectations, muscle firing,  envelope of function, HEP, POC  Person educated: Patient Education method: Explanation, Demonstration, and Handouts Education comprehension: verbalized understanding and returned demonstration   HOME EXERCISE PROGRAM: Access Code: [email or web address] URL: [email or web address] Date: 03/26/2022 Prepared by: Zebedee Iba    ASSESSMENT:  CLINICAL IMPRESSION: Pt is nearly 10 wks at this time and able to continue with progression of strengthening. Pt with good tolerance to progression of all exercise. Pt with very mild anterior shoulder discomfort as anticipated but no increase in pain during session. Plan to continue with strength as tolerated. Pt is progressing well through strengthening phase of rehab protocol. Plan to perform more OH strengthening motions at next and OH iso holds.  Pt would benefit from continued skilled therapy in order to reach goals and maximize functional L UE strength and ROM for full return to PLOF. Pt advised to avoid discomfort or pain with lifting.  OBJECTIVE IMPAIRMENTS decreased ROM, decreased strength, hypomobility, increased edema, increased fascial restrictions, impaired flexibility, impaired UE functional use, and pain.   ACTIVITY LIMITATIONS carrying, lifting, bed mobility, bathing, toileting, dressing, self feeding, reach over head, hygiene/grooming, and caring for others  PARTICIPATION LIMITATIONS:  meal prep, cleaning, laundry, interpersonal relationship, driving, shopping, community activity, and occupation  PERSONAL FACTORS Age, Past/current experiences, and Time since onset of injury/illness/exacerbation are also affecting patient's functional outcome.   REHAB POTENTIAL: Excellent  CLINICAL DECISION MAKING: Stable/uncomplicated  EVALUATION COMPLEXITY: Low   GOALS: Goals reviewed with patient? Yes  SHORT TERM GOALS: Target date: 04/11/2022  (Remove Blue Hyperlink)  L shoulder flexion PROM and AAROM to be at least 150 degrees Baseline: Goal status: MET  2.  L shoulder ABD PROM to be 90 degrees  Baseline:  Goal status: MET  3.  L shoulder ER PROM to be 45 degrees  Baseline:  Goal status: MET  4.  Will be compliant with appropriate progressive HEP  Baseline:  Goal status:MET   LONG TERM GOALS: Target date: 08/11/2022   L shoulder PROM and AROM to be full in all planes of motion Baseline:  Goal status: ongoing  2.  MMT to be at least 4/5 in all groups L shoulder  Baseline:  Goal status:MET  3.  Will be able to reach overhead without increase in pain or poor scapular mechanics  Baseline:  Goal status: MET  4.  Will have returned to light gym based exercise program as appropriate dependent on progress post-op  Baseline:  Goal status: ongoing  5. Pt will be able to reach [facility] and carry/hold >10 lbs in order to demonstrate functional improvement in L UE strength for return to PLOF and exercise.    Goal status: new   6.Pt will be able to demonstrate full golf swing without pain  in  order to demonstrate functional improvement in UE/LE function for self-care and house hold duties.    Goal status: new PLAN: PT FREQUENCY: 1x/week  PT DURATION: 12 wks  PLANNED INTERVENTIONS: Therapeutic exercises, Therapeutic activity, Neuromuscular re-education, Balance training, Gait training, Patient/Family education, Self Care, Joint mobilization, Dry Needling, Electrical  stimulation, Cryotherapy, Moist heat, Taping, Ultrasound, Ionotophoresis 4mg /ml Dexamethasone, Manual therapy, and Re-evaluation  PLAN FOR NEXT SESSION:  Cont with Dr. Serena Croissant Subscapularis repair protocol with regards to biceps tenodesis.  Zebedee Iba PT, DPT 05/19/22 10:11 AM

## 2022-05-20 ENCOUNTER — Encounter (HOSPITAL_COMMUNITY): Payer: Self-pay

## 2022-05-20 ENCOUNTER — Other Ambulatory Visit: Payer: Self-pay

## 2022-05-20 ENCOUNTER — Encounter (HOSPITAL_COMMUNITY)
Admission: RE | Admit: 2022-05-20 | Discharge: 2022-05-20 | Disposition: A | Discharge status: Home / Self Care | Payer: Managed Care, Other (non HMO) | Source: Ambulatory Visit | Attending: Surgery | Admitting: Surgery

## 2022-05-20 VITALS — BP 113/79 | HR 76 | Temp 97.7°F | Resp 16 | Ht 71.0 in | Wt 181.0 lb

## 2022-05-20 DIAGNOSIS — Z01812 Encounter for preprocedural laboratory examination: Secondary | ICD-10-CM | POA: Insufficient documentation

## 2022-05-20 DIAGNOSIS — Z01818 Encounter for other preprocedural examination: Secondary | ICD-10-CM

## 2022-05-20 LAB — CBC
HCT: 46.5 % (ref 39.0–52.0)
Hemoglobin: 15.7 g/dL (ref 13.0–17.0)
MCH: 31.2 pg (ref 26.0–34.0)
MCHC: 33.8 g/dL (ref 30.0–36.0)
MCV: 92.4 fL (ref 80.0–100.0)
Platelets: 175 10*3/uL (ref 150–400)
RBC: 5.03 MIL/uL (ref 4.22–5.81)
RDW: 12.2 % (ref 11.5–15.5)
WBC: 4.9 10*3/uL (ref 4.0–10.5)
nRBC: 0 % (ref 0.0–0.2)

## 2022-05-20 NOTE — Patient Instructions (Signed)
SURGICAL WAITING ROOM VISITATION Patients having surgery or a procedure may have no more than 2 support people in the waiting area - these visitors may rotate.   Children under the age of 82 must have an adult with them who is not the patient. If the patient needs to stay at the hospital during part of their recovery, the visitor guidelines for inpatient rooms apply. Pre-op nurse will coordinate an appropriate time for 1 support person to accompany patient in pre-op.  This support person may not rotate.    Please refer to the [facility] website for the visitor guidelines for Inpatients (after your surgery is over and you are in a regular room).       Your procedure is scheduled on:  05/23/2022    Report to [facility] Main Entrance    Report to admitting at  0800 AM   Call this number if you have problems the morning of surgery [phone]   Do not eat food :After Midnight.   After Midnight you may have the following liquids until ____ 0700__ AM  DAY OF SURGERY  Water Non-Citrus Juices (without pulp, NO RED) Carbonated Beverages Black Coffee (NO MILK/CREAM OR CREAMERS, sugar ok)  Clear Tea (NO MILK/CREAM OR CREAMERS, sugar ok) regular and decaf                             Plain Jell-O (NO RED)                                           Fruit ices (not with fruit pulp, NO RED)                                     Popsicles (NO RED)                                                               Sports drinks like Gatorade (NO RED)              .             If you have questions, please contact your surgeon's office.       Oral Hygiene is also important to reduce your risk of infection.                                    Remember - BRUSH YOUR TEETH THE MORNING OF SURGERY WITH YOUR REGULAR TOOTHPASTE   Do NOT smoke after Midnight   Take these medicines the morning of surgery with A SIP OF WATER: lexapro   DO NOT TAKE ANY ORAL DIABETIC MEDICATIONS DAY OF YOUR  SURGERY  Bring CPAP mask and tubing day of surgery.                              You may not have any metal on your body including hair pins, jewelry, and body piercing  Do not wear make-up, lotions, powders, perfumes/cologne, or deodorant  Do not wear nail polish including gel and S&S, artificial/acrylic nails, or any other type of covering on natural nails including finger and toenails. If you have artificial nails, gel coating, etc. that needs to be removed by a nail salon please have this removed prior to surgery or surgery may need to be canceled/ delayed if the surgeon/ anesthesia feels like they are unable to be safely monitored.   Do not shave  48 hours prior to surgery.               Men may shave face and neck.   Do not bring valuables to the hospital. [place].   Contacts, dentures or bridgework may not be worn into surgery.   Bring small overnight bag day of surgery.   DO NOT [place]. PHARMACY WILL DISPENSE MEDICATIONS LISTED ON YOUR MEDICATION LIST TO YOU DURING YOUR ADMISSION [place]!    Patients discharged on the day of surgery will not be allowed to drive home.  Someone NEEDS to stay with you for the first 24 hours after anesthesia.   Special Instructions: Bring a copy of your healthcare power of attorney and living will documents the day of surgery if you haven't scanned them before.              Please read over the following fact sheets you were given: IF [place] [phone]   If you received a COVID test during your pre-op visit  it is requested that you wear a mask when out in public, stay away from anyone that may not be feeling well and notify your surgeon if you develop symptoms. If you test positive for Covid or have been in contact with anyone that has tested positive in the last 10 days please notify  you surgeon.     [place] - Preparing for Surgery Before surgery, you can play an important role.  Because skin is not sterile, your skin needs to be as free of germs as possible.  You can reduce the number of germs on your skin by washing with CHG (chlorahexidine gluconate) soap before surgery.  CHG is an antiseptic cleaner which kills germs and bonds with the skin to continue killing germs even after washing. Please DO NOT use if you have an allergy to CHG or antibacterial soaps.  If your skin becomes reddened/irritated stop using the CHG and inform your nurse when you arrive at Short Stay. Do not shave (including legs and underarms) for at least 48 hours prior to the first CHG shower.  You may shave your face/neck. Please follow these instructions carefully:  1.  Shower with CHG Soap the night before surgery and the  morning of Surgery.  2.  If you choose to wash your hair, wash your hair first as usual with your  normal  shampoo.  3.  After you shampoo, rinse your hair and body thoroughly to remove the  shampoo.                           4.  Use CHG as you would any other liquid soap.  You can apply chg directly  to the skin and wash  Gently with a scrungie or clean washcloth.  5.  Apply the CHG Soap to your body ONLY FROM THE NECK DOWN.   Do not use on face/ open                           Wound or open sores. Avoid contact with eyes, ears mouth and genitals (private parts).                       Wash face,  Genitals (private parts) with your normal soap.             6.  Wash thoroughly, paying special attention to the area where your surgery  will be performed.  7.  Thoroughly rinse your body with warm water from the neck down.  8.  DO NOT shower/wash with your normal soap after using and rinsing off  the CHG Soap.                9.  Pat yourself dry with a clean towel.            10.  Wear clean pajamas.            11.  Place clean sheets on your bed the night of your  first shower and do not  sleep with pets. Day of Surgery : Do not apply any lotions/deodorants the morning of surgery.  Please wear clean clothes to the hospital/surgery center.  FAILURE TO FOLLOW THESE INSTRUCTIONS MAY RESULT IN THE CANCELLATION OF YOUR SURGERY PATIENT SIGNATURE_________________________________  NURSE SIGNATURE__________________________________  ________________________________________________________________________

## 2022-05-20 NOTE — Progress Notes (Addendum)
Anesthesia Review:  PCP: [name] Martinique  LOv 01/01/22  Cardiologist : none  Chest x-ray : EKG : Echo : Stress test: Cardiac Cath :  Activity level: can do a flight of stairs withoiut diffficutly  Sleep Study/ CPAP : none  Fasting Blood Sugar :      / Checks Blood Sugar -- times a day:   Blood Thinner/ Instructions /Last Dose: ASA / Instructions/ Last Dose :   325 mg aspirin

## 2022-05-23 ENCOUNTER — Encounter (HOSPITAL_COMMUNITY)
Admission: RE | Disposition: A | Discharge status: Home / Self Care | Payer: Self-pay | Source: Home / Self Care | Attending: Surgery

## 2022-05-23 ENCOUNTER — Other Ambulatory Visit: Payer: Self-pay

## 2022-05-23 ENCOUNTER — Encounter (HOSPITAL_COMMUNITY): Payer: Self-pay | Admitting: Surgery

## 2022-05-23 ENCOUNTER — Ambulatory Visit (HOSPITAL_COMMUNITY): Payer: Managed Care, Other (non HMO) | Admitting: Anesthesiology

## 2022-05-23 ENCOUNTER — Ambulatory Visit (HOSPITAL_COMMUNITY)
Admission: RE | Admit: 2022-05-23 | Discharge: 2022-05-23 | Disposition: A | Discharge status: Home / Self Care | Payer: Managed Care, Other (non HMO) | Attending: Surgery | Admitting: Surgery

## 2022-05-23 DIAGNOSIS — T182XXA Foreign body in stomach, initial encounter: Secondary | ICD-10-CM | POA: Insufficient documentation

## 2022-05-23 DIAGNOSIS — K632 Fistula of intestine: Secondary | ICD-10-CM

## 2022-05-23 DIAGNOSIS — S30851A Superficial foreign body of abdominal wall, initial encounter: Secondary | ICD-10-CM | POA: Diagnosis not present

## 2022-05-23 DIAGNOSIS — F419 Anxiety disorder, unspecified: Secondary | ICD-10-CM | POA: Diagnosis not present

## 2022-05-23 DIAGNOSIS — Z01818 Encounter for other preprocedural examination: Secondary | ICD-10-CM

## 2022-05-23 HISTORY — PX: WOUND EXPLORATION: SHX6188

## 2022-05-23 LAB — CBC
HCT: 46.5 % (ref 39.0–52.0)
Hemoglobin: 16 g/dL (ref 13.0–17.0)
MCH: 31.4 pg (ref 26.0–34.0)
MCHC: 34.4 g/dL (ref 30.0–36.0)
MCV: 91.4 fL (ref 80.0–100.0)
Platelets: 172 10*3/uL (ref 150–400)
RBC: 5.09 MIL/uL (ref 4.22–5.81)
RDW: 12.1 % (ref 11.5–15.5)
WBC: 6.3 10*3/uL (ref 4.0–10.5)
nRBC: 0 % (ref 0.0–0.2)

## 2022-05-23 SURGERY — WOUND EXPLORATION
Anesthesia: Monitor Anesthesia Care

## 2022-05-23 MED ORDER — ORAL CARE MOUTH RINSE: 15.0000 mL | Freq: Once | OROMUCOSAL | Status: DC

## 2022-05-23 MED ORDER — 0.9 % SODIUM CHLORIDE (POUR BTL) OPTIME
TOPICAL | Status: DC | PRN
  Administered 2022-05-23: 1000 mL

## 2022-05-23 MED ORDER — CHLORHEXIDINE GLUCONATE 0.12 % MT SOLN
15.0000 mL | Freq: Once | OROMUCOSAL | Status: AC
  Administered 2022-05-23: 15 mL via OROMUCOSAL

## 2022-05-23 MED ORDER — CHLORHEXIDINE GLUCONATE CLOTH 2 % EX PADS: 6.0000 | MEDICATED_PAD | Freq: Once | CUTANEOUS | Status: DC

## 2022-05-23 MED ORDER — ONDANSETRON HCL 4 MG/2ML IJ SOLN
INTRAMUSCULAR | Status: DC | PRN
  Administered 2022-05-23: 4 mg via INTRAVENOUS

## 2022-05-23 MED ORDER — ACETAMINOPHEN 500 MG PO TABS: 1000.0000 mg | ORAL_TABLET | Freq: Once | ORAL | Status: DC

## 2022-05-23 MED ORDER — CHLORHEXIDINE GLUCONATE 0.12 % MT SOLN: 15.0000 mL | Freq: Once | OROMUCOSAL | Status: DC

## 2022-05-23 MED ORDER — PROPOFOL 500 MG/50ML IV EMUL
INTRAVENOUS | Status: DC | PRN
  Administered 2022-05-23: 100 ug/kg/min via INTRAVENOUS

## 2022-05-23 MED ORDER — BUPIVACAINE-EPINEPHRINE (PF) 0.5% -1:200000 IJ SOLN
INTRAMUSCULAR | Status: AC
  Filled 2022-05-23: qty 30

## 2022-05-23 MED ORDER — FENTANYL CITRATE (PF) 100 MCG/2ML IJ SOLN
INTRAMUSCULAR | Status: DC | PRN
  Administered 2022-05-23: 100 ug via INTRAVENOUS

## 2022-05-23 MED ORDER — OXYCODONE HCL 5 MG/5ML PO SOLN: 5.0000 mg | Freq: Once | ORAL | Status: DC | PRN

## 2022-05-23 MED ORDER — LACTATED RINGERS IV SOLN: INTRAVENOUS | Status: DC

## 2022-05-23 MED ORDER — FENTANYL CITRATE (PF) 100 MCG/2ML IJ SOLN
INTRAMUSCULAR | Status: AC
  Filled 2022-05-23: qty 2

## 2022-05-23 MED ORDER — MIDAZOLAM HCL 2 MG/2ML IJ SOLN
INTRAMUSCULAR | Status: AC
  Filled 2022-05-23: qty 2

## 2022-05-23 MED ORDER — FENTANYL CITRATE PF 50 MCG/ML IJ SOSY: 25.0000 ug | PREFILLED_SYRINGE | INTRAMUSCULAR | Status: DC | PRN

## 2022-05-23 MED ORDER — PROMETHAZINE HCL 25 MG/ML IJ SOLN: 6.2500 mg | INTRAMUSCULAR | Status: DC | PRN

## 2022-05-23 MED ORDER — OXYCODONE HCL 5 MG PO TABS: 5.0000 mg | ORAL_TABLET | Freq: Once | ORAL | Status: DC | PRN

## 2022-05-23 MED ORDER — ORAL CARE MOUTH RINSE: 15.0000 mL | Freq: Once | OROMUCOSAL | Status: AC

## 2022-05-23 MED ORDER — TRAMADOL HCL 50 MG PO TABS: 50.0000 mg | ORAL_TABLET | Freq: Four times a day (QID) | ORAL | 0 refills | Status: DC | PRN

## 2022-05-23 MED ORDER — CEFAZOLIN SODIUM-DEXTROSE 2-4 GM/100ML-% IV SOLN
2.0000 g | INTRAVENOUS | Status: AC
  Administered 2022-05-23: 2 g via INTRAVENOUS
  Filled 2022-05-23: qty 100

## 2022-05-23 MED ORDER — BUPIVACAINE-EPINEPHRINE 0.5% -1:200000 IJ SOLN
INTRAMUSCULAR | Status: DC | PRN
  Administered 2022-05-23: 12 mL

## 2022-05-23 MED ORDER — MIDAZOLAM HCL 2 MG/2ML IJ SOLN
INTRAMUSCULAR | Status: DC | PRN
  Administered 2022-05-23: 2 mg via INTRAVENOUS

## 2022-05-23 MED ORDER — LIDOCAINE HCL (PF) 1 % IJ SOLN
INTRAMUSCULAR | Status: AC
  Filled 2022-05-23: qty 30

## 2022-05-23 SURGICAL SUPPLY — 32 items
BAG COUNTER SPONGE SURGICOUNT (BAG) IMPLANT
CHLORAPREP W/TINT 26 (MISCELLANEOUS) ×1 IMPLANT
COVER SURGICAL LIGHT HANDLE (MISCELLANEOUS) ×1 IMPLANT
DERMABOND ADVANCED .7 DNX12 (GAUZE/BANDAGES/DRESSINGS) IMPLANT
DRAPE LAPAROSCOPIC ABDOMINAL (DRAPES) IMPLANT
DRAPE LAPAROTOMY T 102X78X121 (DRAPES) IMPLANT
DRAPE LAPAROTOMY TRNSV 102X78 (DRAPES) IMPLANT
DRAPE SHEET LG 3/4 BI-LAMINATE (DRAPES) IMPLANT
ELECT REM PT RETURN 15FT ADLT (MISCELLANEOUS) ×1 IMPLANT
GAUZE SPONGE 4X4 12PLY STRL (GAUZE/BANDAGES/DRESSINGS) IMPLANT
GLOVE BIOGEL PI IND STRL 7.0 (GLOVE) ×1 IMPLANT
GLOVE SURG ORTHO 8.0 STRL STRW (GLOVE) ×1 IMPLANT
GLOVE SURG SYN 7.5  E (GLOVE) ×1
GLOVE SURG SYN 7.5 E (GLOVE) ×1 IMPLANT
GLOVE SURG SYN 7.5 PF PI (GLOVE) ×1 IMPLANT
GOWN STRL REUS W/ TWL XL LVL3 (GOWN DISPOSABLE) ×2 IMPLANT
GOWN STRL REUS W/TWL XL LVL3 (GOWN DISPOSABLE) ×2
KIT BASIN OR (CUSTOM PROCEDURE TRAY) ×1 IMPLANT
KIT TURNOVER KIT A (KITS) IMPLANT
MARKER SKIN DUAL TIP RULER LAB (MISCELLANEOUS) IMPLANT
NDL HYPO 25X1 1.5 SAFETY (NEEDLE) ×1 IMPLANT
NEEDLE HYPO 25X1 1.5 SAFETY (NEEDLE) ×1 IMPLANT
NS IRRIG 1000ML POUR BTL (IV SOLUTION) ×1 IMPLANT
PACK GENERAL/GYN (CUSTOM PROCEDURE TRAY) ×1 IMPLANT
SPIKE FLUID TRANSFER (MISCELLANEOUS) IMPLANT
SPONGE T-LAP 4X18 ~~LOC~~+RFID (SPONGE) IMPLANT
STAPLER VISISTAT 35W (STAPLE) IMPLANT
STRIP CLOSURE SKIN 1/2X4 (GAUZE/BANDAGES/DRESSINGS) IMPLANT
SUT MNCRL AB 4-0 PS2 18 (SUTURE) IMPLANT
SUT VIC AB 3-0 SH 18 (SUTURE) IMPLANT
SYR CONTROL 10ML LL (SYRINGE) ×1 IMPLANT
TOWEL OR 17X26 10 PK STRL BLUE (TOWEL DISPOSABLE) ×1 IMPLANT

## 2022-05-23 NOTE — Op Note (Signed)
Operative Note  Pre-operative Diagnosis:  chronic cutaneous fistula, foreign body abdominal wall  Post-operative Diagnosis:  same  Surgeon:  Armandina Gemma, MD  Assistant:  none   Procedure:  Wound exploration, excision of suture material and mesh abdominal wall  Anesthesia:  local with IV sedation  Estimated Blood Loss:  minimal  Drains: none         Specimen: none  Indications:  Patient is referred by Dr. Jarvis Newcomer from dermatology for surgical evaluation and management of a recurrent infection secondary to foreign body at the site of previous incisional hernia repair. Patient had undergone a laparotomy in about 2007 in [place] for intussusception. He was found to have a large lipoma. Patient subsequently developed an incisional hernia at the level of the umbilicus. He underwent repair with mesh about 2009. Patient has developed intermittent episodes of pain and drainage at the site of the surgical wound at the umbilicus. Patient states this occurs about every 3 to 4 weeks. There is drainage of a white discharge which eventually dries up until it recurs again a few weeks later. Patient denies any other abdominal pain. He has a normal appetite. He is having normal bowel movements. Patient presents today for surgical assessment.   Procedure:  The patient was seen in the pre-op holding area. The risks, benefits, complications, treatment options, and expected outcomes were previously discussed with the patient. The patient agreed with the proposed plan and has signed the informed consent form.  The patient was brought to the operating room by the surgical team, identified as Chauncey Reading and the procedure verified. A "time out" was completed and the above information confirmed.  Following administration of intravenous sedation, the patient was prepped and draped in the usual aseptic fashion.  After ascertaining that an adequate level of sedation had been achieved, the skin at  the site of the chronic fistula at the right side of the umbilicus is anesthetized with local anesthetic.  Using a #15 blade an elliptical incision is made so as to excise the fistula tract.  Dissection was carried in the subcutaneous tissues.  Suture material and unincorporated mesh is identified.  There is no obvious infection.  Suture material and unincorporated mesh is excised sharply with the Metzenbaum scissors.  Rounding tissue was cauterized with the electrocautery.  Small skin flaps are developed circumferentially.  Wound is irrigated with warm saline.  Skin edges are reapproximated with interrupted 4-0 Monocryl subcuticular sutures.  There is a palpable area of foreign body in the upper portion of the midline wound just above the umbilicus.  This is anesthetized with local anesthetic.  An incision is made over the palpable foreign body and explored.  This is unincorporated mesh.  This is sharply trimmed back to the abdominal wall using the Metzenbaum scissors.  Wound is closed with interrupted 4-0 Monocryl subcuticular sutures.  Both wounds are washed and dried and Dermabond is applied as dressing.  Patient is awakened from sedation and transported to the recovery room in stable condition.  The patient tolerated the procedure well.   Armandina Gemma, [place] [facility] Office: [phone]

## 2022-05-23 NOTE — Transfer of Care (Signed)
Immediate Anesthesia Transfer of Care Note  Patient: Kent Johnson  Procedure(s) Performed: WOUND EXPLORATION WITH REMOVAL OF FOREIGN BODY OF ABDOMINAL WALL  Patient Location: PACU  Anesthesia Type:MAC  Level of Consciousness: sedated, patient cooperative, and responds to stimulation  Airway & Oxygen Therapy: Patient Spontanous Breathing and Patient connected to face mask oxygen  Post-op Assessment: Report given to RN and Post -op Vital signs reviewed and stable  Post vital signs: Reviewed and stable  Last Vitals:  Vitals Value Taken Time  BP 122/66 05/23/22 1043  Temp    Pulse 79 05/23/22 1045  Resp 19 05/23/22 1045  SpO2 100 % 05/23/22 1045  Vitals shown include unvalidated device data.  Last Pain:  Vitals:   05/23/22 0814  TempSrc: Oral  PainSc: 0-No pain         Complications: No notable events documented.

## 2022-05-23 NOTE — Anesthesia Postprocedure Evaluation (Signed)
Anesthesia Post Note  Patient: Kent Johnson  Procedure(s) Performed: WOUND EXPLORATION WITH REMOVAL OF FOREIGN BODY OF ABDOMINAL WALL     Patient location during evaluation: PACU Anesthesia Type: MAC Level of consciousness: awake and alert Pain management: pain level controlled Vital Signs Assessment: post-procedure vital signs reviewed and stable Respiratory status: spontaneous breathing, nonlabored ventilation and respiratory function stable Cardiovascular status: stable and blood pressure returned to baseline Anesthetic complications: no   No notable events documented.  Last Vitals:  Vitals:   05/23/22 1115 05/23/22 1120  BP: 124/84 (!) 135/44  Pulse: 64 (!) 59  Resp: 17 18  Temp: 36.7 C   SpO2: 100% 100%    Last Pain:  Vitals:   05/23/22 1115  TempSrc:   PainSc: 0-No pain                 [name]

## 2022-05-23 NOTE — Discharge Instructions (Addendum)
  [facility] -- DISCHARGE INSTRUCTIONS  REMINDER:   Carry a list of your medications and allergies with you at all times  Call your pharmacy at least 1 week in advance to refill prescriptions  Do not mix any prescribed pain medicine with alcohol  Do not drive any motor vehicles while taking pain medication  Take medications with food unless otherwise directed  Follow-up appointments (date to return to physician): Please call [phone] to confirm your follow up appointment with your surgeon.  Call your Surgeon if you have:  Temperature greater than 101.0  Persistent nausea and vomiting  Severe uncontrolled pain  Redness, tenderness, or signs of infection (pain, swelling, redness, odor or green/yellow discharge around the site)  Difficulty breathing, headache or visual disturbances  Hives  Persistent dizziness or light-headedness  Any other questions or concerns you may have after discharge  In an emergency, call 911 or go to an Emergency Department at a nearby hospital.  Diet: Begin with liquids, and if they are tolerated, resume your usual diet.  Avoid spicy, greasy or heavy foods.  If you have nausea or vomiting, go back to liquids.  If you cannot keep liquids down, call your doctor.  Avoid alcohol consumption while on prescription pain medications. Good nutrition promotes healing. Increase fiber and fluids.   ADDITIONAL INSTRUCTIONS: Leave Dermabond in place for 7-10 days.  May shower.  Use ice pack as needed for discomfort.  [place] [facility] Office: [phone]

## 2022-05-23 NOTE — Anesthesia Preprocedure Evaluation (Addendum)
Anesthesia Evaluation  Patient identified by MRN, date of birth, ID band Patient awake    Reviewed: Allergy & Precautions, NPO status , Patient's Chart, lab work & pertinent test results  History of Anesthesia Complications Negative for: history of anesthetic complications  Airway Mallampati: II  TM Distance: >3 FB Neck ROM: Full    Dental  (+) Dental Advisory Given, Teeth Intact   Pulmonary neg pulmonary ROS   Pulmonary exam normal        Cardiovascular negative cardio ROS Normal cardiovascular exam     Neuro/Psych  PSYCHIATRIC DISORDERS Anxiety     negative neurological ROS     GI/Hepatic negative GI ROS, Neg liver ROS,,,  Endo/Other  negative endocrine ROS    Renal/GU negative Renal ROS     Musculoskeletal negative musculoskeletal ROS (+)    Abdominal   Peds  Hematology negative hematology ROS (+)   Anesthesia Other Findings   Reproductive/Obstetrics                             Anesthesia Physical Anesthesia Plan  ASA: 1  Anesthesia Plan: MAC   Post-op Pain Management: Tylenol PO (pre-op)*   Induction:   PONV Risk Score and Plan: 1 and Propofol infusion and Treatment may vary due to age or medical condition  Airway Management Planned: Natural Airway and Simple Face Mask  Additional Equipment: None  Intra-op Plan:   Post-operative Plan:   Informed Consent: I have reviewed the patients History and Physical, chart, labs and discussed the procedure including the risks, benefits and alternatives for the proposed anesthesia with the patient or authorized representative who has indicated his/her understanding and acceptance.       Plan Discussed with: CRNA and Anesthesiologist  Anesthesia Plan Comments:        Anesthesia Quick Evaluation

## 2022-05-23 NOTE — Interval H&P Note (Signed)
History and Physical Interval Note:  05/23/2022 9:34 AM  Kent Johnson  has presented today for surgery, with the diagnosis of CHRONIC SUTURE ABSCESS WITH SINUS TRACTS.  The various methods of treatment have been discussed with the patient and family. After consideration of risks, benefits and other options for treatment, the patient has consented to    Procedure(s): WOUND EXPLORATION WITH REMOVAL OF FOREIGN BODY OF ABDOMINAL WALL (N/A) REMOVAL FOREIGN BODY ABDOMINAL (N/A) as a surgical intervention.    The patient's history has been reviewed, patient examined, no change in status, stable for surgery.  I have reviewed the patient's chart and labs.  Questions were answered to the patient's satisfaction.    Armandina Gemma, [facility] [place] [facility] Office: [place]

## 2022-05-24 ENCOUNTER — Encounter (HOSPITAL_COMMUNITY): Payer: Self-pay | Admitting: Surgery

## 2022-05-26 ENCOUNTER — Ambulatory Visit (HOSPITAL_BASED_OUTPATIENT_CLINIC_OR_DEPARTMENT_OTHER): Payer: Managed Care, Other (non HMO) | Attending: Orthopaedic Surgery | Admitting: Physical Therapy

## 2022-05-26 ENCOUNTER — Encounter (HOSPITAL_BASED_OUTPATIENT_CLINIC_OR_DEPARTMENT_OTHER): Payer: Self-pay | Admitting: Physical Therapy

## 2022-05-26 DIAGNOSIS — M6281 Muscle weakness (generalized): Secondary | ICD-10-CM | POA: Insufficient documentation

## 2022-05-26 DIAGNOSIS — R600 Localized edema: Secondary | ICD-10-CM | POA: Insufficient documentation

## 2022-05-26 DIAGNOSIS — M25512 Pain in left shoulder: Secondary | ICD-10-CM | POA: Diagnosis not present

## 2022-05-26 DIAGNOSIS — M25612 Stiffness of left shoulder, not elsewhere classified: Secondary | ICD-10-CM | POA: Diagnosis present

## 2022-05-26 NOTE — Therapy (Signed)
OUTPATIENT PHYSICAL THERAPY TREATMENT NOTE   Patient Name: Kent Johnson MRN: [id number] DOB:September 06, 1985, 36 y.o., male Today's Date: 05/26/2022   PT End of Session - 05/26/22 1514     Visit Number 13    Number of Visits 24    Date for PT Re-Evaluation 08/05/22    Authorization Type [employer]    Authorization Time Period 03/14/22 to 05/09/22    PT Start Time 1515    PT Stop Time 1600    PT Time Calculation (min) 45 min    Activity Tolerance Patient tolerated treatment well    Behavior During Therapy [facility] for tasks assessed/performed                      History reviewed. No pertinent past medical history.  Past Surgical History:  Procedure Laterality Date   CLAVICLE SURGERY  2011   broke collar bone skiing   LIPOMA EXCISION  2007   benign lipoma in colon, caused intersepcion   SHOULDER ARTHROSCOPY WITH DEBRIDEMENT AND BICEP TENDON REPAIR Left 03/11/2022   Procedure: LEFT SHOULDER ARTHROSCOPY WITH MINI OPEN SUBSCAPULARIS REPAIR  AND BICEP TENDODESIS;  Surgeon: Huel Cote, MD;  Location: [facility];  Service: Orthopedics;  Laterality: Left;   WOUND EXPLORATION N/A 05/23/2022   Procedure: WOUND EXPLORATION WITH REMOVAL OF FOREIGN BODY OF ABDOMINAL WALL;  Surgeon: Darnell Level, MD;  Location: [facility];  Service: General;  Laterality: N/A;   Patient Active Problem List   Diagnosis Date Noted   Foreign body in anterior abdominal wall 05/18/2022   Avulsion of left subscapularis    Anxiety disorder 11/11/2021   Insomnia 11/11/2021    PCP: [name], [name]   REFERRING PROVIDER: Huel Cote, MD   REFERRING DIAG: [phone] (ICD-10-CM) - Avulsion of left subscapularis, initial encounter   THERAPY DIAG:  Acute pain of left shoulder  Stiffness of left shoulder, not elsewhere classified  Muscle weakness (generalized)  Localized edema  Rationale for Evaluation and Treatment Rehabilitation  ONSET DATE: 02/03/2022, DOS  8/22    SUBJECTIVE:                                                                                                                                                                                      SUBJECTIVE STATEMENT: Pt is nearly 11 wks s/p L subscapularis repair and biceps tenodesis.    Pt states the ROM is improving greatly. He had another "breakthrough" week with the shoulder.  Pt states he went on a run for about 2.5 miles and the shoulder felt good after.   PERTINENT HISTORY: L shoulder Arthroscopic rotator cuff repair (subscapularis) and biceps tenodesis on  03/11/22 36 y.o. 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  I did discuss treatment options with him.  At this time his mechanical symptoms continue to be quite bothersome for him.  He does have noticeable weakness in the setting of a tear of the subscapularis which is likely been going on for quite some time.  I did discuss possible treatment options.  At this time I do not feel an injection would benefit him significantly as this would not remove his focus of heterotopic ossification.  I did discuss that a formal rehab protocol could provide some benefit although that being said he does continue to experience significant mechanical symptoms associated with area of heterotopic ossification so I am less hopeful that physical therapy would provide any type of meaningful difference as his previous home exercise rotator cuff programs have led to aggravation of the symptoms.  At this time given his young age and desire to stay active, I do believe that he would benefit from a surgery to remove his focus of heterotopic ossification.  I would plan to send this for pathology as well.  I would plan to repair the subscapularis tendon in an open fashion at that time.  We did discuss that he does also have some biceps symptoms as well.  There is evidence of  vital sign of subluxation on the MRI.  As result I do believe that he would benefit from a biceps tenodesis at the time of open subscapularis repair.  After discussion of his options at some length.  He has opted for surgical intervention.   PAIN:  Are you having pain? Yes: NPRS scale: 0/10 Pain location: L shoulder  Pain description: not centralized, more like a light throb  Aggravating factors: sudden jolts  Relieving factors: pain medicine, in or out of sling depending   PRECAUTIONS: Shoulder subscap protocol   WEIGHT BEARING RESTRICTIONS Yes NWB   FALLS:  Has patient fallen in last 6 months? No  LIVING ENVIRONMENT: Lives with: lives with their family Lives in: House/apartment Stairs: 5 STE B, flight U rail  Has following equipment at home:  ice machine   OCCUPATION: Engineer   PLOF: Independent, Independent with basic ADLs, Independent with gait, and Independent with transfers  PATIENT GOALS back to 100%, be pain free, get ROM back, recreational sports   OBJECTIVE:   UPPER EXTREMITY ROM:    AROM Left 10/18  Shoulder flexion 155  Shoulder extension 35   Shoulder abduction 140  Shoulder adduction    Shoulder internal rotation To belly  Shoulder external rotation 40  (Blank rows = not tested)    TODAY'S TREATMENT:   11/6  UBE L3 2 mins retro and fwd   -PNF manual resisted D1/D2 3x10 golf club ER stretch 10s 5x Single arm cable chest press 10 lbs 2x10 Table push up 3x6  Practice back swing to impact position (no speed)  Exercises - Single Arm Doorway Pec Stretch at 90 Degrees Abduction  - 2 x daily - 7 x weekly - 1 sets - 3 reps - 30 hold - Standing Single Arm Shoulder Internal Rotation in Abduction with Anchored Resistance  - 1 x daily - 2 x weekly - 3 sets - 10 reps - Standing Single Arm Shoulder External Rotation in Abduction with Anchored Resistance  - 1 x daily - 2 x weekly - 3 sets - 10 reps - Shoulder Overhead Press in Flexion with Dumbbells  -  1 x  daily - 2 x weekly - 3 sets - 10 reps - Plank Shoulder Protraction Clocks on BOSU  - 1 x daily - 2 x weekly - 3 sets - 10 reps  10/30  UBE L1 2 mins retro and fwd Program Notes Hand behind back, tie band to post, wrap through elbow and rotate to stretch 5s 10xBanded OH lat stretch and banded pec stretch 5s 10x  Exercises -straight arm scap retraction with heavy green band 3x10 circles CW and CCW -triceps extension rope 17.5lbs 3x10 -chest height row/pull 12.5 lbs 3x8 -standing cable row 17.5lbs 3x8 -bicep curls 3x8 -wall push up 3x8   10/23 L  GHJ ant grade IV joint mob   Program Notes Hand behind back, tie band to post, wrap through elbow and rotate to stretch 5s 10xBanded OH lat stretch and banded pec stretch 5s 10x  Exercises - Single Arm Doorway Pec Stretch at 60 Elevation  - 2 x daily - 7 x weekly - 1 sets - 3 reps - 30 hold - Shoulder External Rotation and Scapular Retraction with Resistance  - 1 x daily - 2 x weekly - 3 sets - 10 reps - Standing Shoulder Horizontal Abduction with Resistance  - 1 x daily - 2 x weekly - 3 sets - 10 reps - Scaption with Dumbbells  - 1 x daily - 2 x weekly - 2-3 sets - 8 reps - Standing Bent Over Single Arm Scapular Row with Table Support  - 1 x daily - 2 x weekly - 3 sets - 8 reps - Full Plank  - 1 x daily - 2 x weekly - 1 sets - 4 reps - 20s hold - Shoulder Internal Rotation with Resistance  - 1 x daily - 2 x weekly - 2 sets - 10 reps   PATIENT EDUCATION: Education details: biomechanics, periodization, anatomy, exercise progression, DOMS expectations, muscle firing,  envelope of function, HEP, POC  Person educated: Patient Education method: Explanation, Demonstration, and Handouts Education comprehension: verbalized understanding and returned demonstration   HOME EXERCISE PROGRAM: Access Code: [email or web address] URL: [email or web address] Date: 03/26/2022 Prepared by: Zebedee Iba    ASSESSMENT:  CLINICAL IMPRESSION: Pt is  nearly 11 wks at this time and able to continue with progression of strengthening. Pt session used to clear pt to start return to gym exercise. Pt without pain throughout session and able to return to gym based motions at light resistance. Pt advised no heavy lifting or motions that are at speed. Pt was cleared to practice half-swing, short pitch type shots and full back swings but should be be swinging with club with any speed until strength is improved. Plan to assess of tolerance to new strength at next and consider plyometrics/light throwing at next. Pt would benefit from continued skilled therapy in order to reach goals and maximize functional L UE strength and ROM for full return to PLOF. Pt advised to avoid discomfort or pain with lifting.  OBJECTIVE IMPAIRMENTS decreased ROM, decreased strength, hypomobility, increased edema, increased fascial restrictions, impaired flexibility, impaired UE functional use, and pain.   ACTIVITY LIMITATIONS carrying, lifting, bed mobility, bathing, toileting, dressing, self feeding, reach over head, hygiene/grooming, and caring for others  PARTICIPATION LIMITATIONS: meal prep, cleaning, laundry, interpersonal relationship, driving, shopping, community activity, and occupation  PERSONAL FACTORS Age, Past/current experiences, and Time since onset of injury/illness/exacerbation are also affecting patient's functional outcome.   REHAB POTENTIAL: Excellent  CLINICAL DECISION MAKING: Stable/uncomplicated  EVALUATION COMPLEXITY: Low   GOALS:  Goals reviewed with patient? Yes  SHORT TERM GOALS: Target date: 04/11/2022  (Remove Blue Hyperlink)  L shoulder flexion PROM and AAROM to be at least 150 degrees Baseline: Goal status: MET  2.  L shoulder ABD PROM to be 90 degrees  Baseline:  Goal status: MET  3.  L shoulder ER PROM to be 45 degrees  Baseline:  Goal status: MET  4.  Will be compliant with appropriate progressive HEP  Baseline:  Goal  status:MET   LONG TERM GOALS: Target date: 08/18/2022   L shoulder PROM and AROM to be full in all planes of motion Baseline:  Goal status: ongoing  2.  MMT to be at least 4/5 in all groups L shoulder  Baseline:  Goal status:MET  3.  Will be able to reach overhead without increase in pain or poor scapular mechanics  Baseline:  Goal status: MET  4.  Will have returned to light gym based exercise program as appropriate dependent on progress post-op  Baseline:  Goal status: ongoing  5. Pt will be able to reach [facility] and carry/hold >10 lbs in order to demonstrate functional improvement in L UE strength for return to PLOF and exercise.    Goal status: new   6.Pt will be able to demonstrate full golf swing without pain  in order to demonstrate functional improvement in UE/LE function for self-care and house hold duties.    Goal status: new PLAN: PT FREQUENCY: 1x/week  PT DURATION: 12 wks  PLANNED INTERVENTIONS: Therapeutic exercises, Therapeutic activity, Neuromuscular re-education, Balance training, Gait training, Patient/Family education, Self Care, Joint mobilization, Dry Needling, Electrical stimulation, Cryotherapy, Moist heat, Taping, Ultrasound, Ionotophoresis 4mg /ml Dexamethasone, Manual therapy, and Re-evaluation  PLAN FOR NEXT SESSION:  Cont with Dr. Serena Croissant Subscapularis repair protocol with regards to biceps tenodesis.  Zebedee Iba PT, DPT 05/26/22 4:15 PM

## 2022-06-02 ENCOUNTER — Ambulatory Visit (INDEPENDENT_AMBULATORY_CARE_PROVIDER_SITE_OTHER): Payer: Managed Care, Other (non HMO) | Admitting: Orthopaedic Surgery

## 2022-06-02 ENCOUNTER — Ambulatory Visit (HOSPITAL_BASED_OUTPATIENT_CLINIC_OR_DEPARTMENT_OTHER): Payer: Managed Care, Other (non HMO) | Admitting: Physical Therapy

## 2022-06-02 ENCOUNTER — Encounter (HOSPITAL_BASED_OUTPATIENT_CLINIC_OR_DEPARTMENT_OTHER): Payer: Self-pay | Admitting: Physical Therapy

## 2022-06-02 ENCOUNTER — Encounter (HOSPITAL_BASED_OUTPATIENT_CLINIC_OR_DEPARTMENT_OTHER): Payer: Managed Care, Other (non HMO) | Admitting: Physical Therapy

## 2022-06-02 DIAGNOSIS — M6281 Muscle weakness (generalized): Secondary | ICD-10-CM

## 2022-06-02 DIAGNOSIS — R600 Localized edema: Secondary | ICD-10-CM

## 2022-06-02 DIAGNOSIS — M25612 Stiffness of left shoulder, not elsewhere classified: Secondary | ICD-10-CM

## 2022-06-02 DIAGNOSIS — S46892A Other injury of other muscles, fascia and tendons at shoulder and upper arm level, left arm, initial encounter: Secondary | ICD-10-CM

## 2022-06-02 DIAGNOSIS — M25512 Pain in left shoulder: Secondary | ICD-10-CM | POA: Diagnosis not present

## 2022-06-02 NOTE — Therapy (Addendum)
OUTPATIENT PHYSICAL THERAPY TREATMENT NOTE   Patient Name: Kent Johnson MRN: [id number] DOB:[birth year], 36 y.o., male Today's Date: 06/02/2022   PT End of Session - 06/02/22 1032     Visit Number 14    Number of Visits 24    Date for PT Re-Evaluation 08/05/22    Authorization Type [employer]    Authorization Time Period 03/14/22 to 05/09/22    PT Start Time 0930    PT Stop Time 1015    PT Time Calculation (min) 45 min    Activity Tolerance Patient tolerated treatment well    Behavior During Therapy [facility] for tasks assessed/performed                       History reviewed. No pertinent past medical history.  Past Surgical History:  Procedure Laterality Date   CLAVICLE SURGERY  2011   broke collar bone skiing   LIPOMA EXCISION  2007   benign lipoma in colon, caused intersepcion   SHOULDER ARTHROSCOPY WITH DEBRIDEMENT AND BICEP TENDON REPAIR Left 03/11/2022   Procedure: LEFT SHOULDER ARTHROSCOPY WITH MINI OPEN SUBSCAPULARIS REPAIR  AND BICEP TENDODESIS;  Surgeon: Huel Cote, MD;  Location: [facility];  Service: Orthopedics;  Laterality: Left;   WOUND EXPLORATION N/A 05/23/2022   Procedure: WOUND EXPLORATION WITH REMOVAL OF FOREIGN BODY OF ABDOMINAL WALL;  Surgeon: Darnell Level, MD;  Location: [facility];  Service: General;  Laterality: N/A;   Patient Active Problem List   Diagnosis Date Noted   Foreign body in anterior abdominal wall 05/18/2022   Avulsion of left subscapularis    Anxiety disorder 11/11/2021   Insomnia 11/11/2021    PCP: [name], [name]   REFERRING PROVIDER: Huel Cote, MD   REFERRING DIAG: [phone] (ICD-10-CM) - Avulsion of left subscapularis, initial encounter   THERAPY DIAG:  Acute pain of left shoulder  Stiffness of left shoulder, not elsewhere classified  Localized edema  Muscle weakness (generalized)  Rationale for Evaluation and Treatment Rehabilitation  ONSET DATE: 02/03/2022, DOS  8/22    SUBJECTIVE:                                                                                                                                                                                      SUBJECTIVE STATEMENT: Pt is nearly 12 wks s/p L subscapularis repair and biceps tenodesis.    Pt states that last week one of his kids jumped on to his arm. Pt states  that pushing forward or lifting the arm into flexion hurts. Pt just came from MD follow up appt. Pain was in the upper biceps area. Pt rested the shoulder for  the last week.   PERTINENT HISTORY: L shoulder Arthroscopic rotator cuff repair (subscapularis) and biceps tenodesis on 03/11/22 36 y.o. 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  I did discuss treatment options with him.  At this time his mechanical symptoms continue to be quite bothersome for him.  He does have noticeable weakness in the setting of a tear of the subscapularis which is likely been going on for quite some time.  I did discuss possible treatment options.  At this time I do not feel an injection would benefit him significantly as this would not remove his focus of heterotopic ossification.  I did discuss that a formal rehab protocol could provide some benefit although that being said he does continue to experience significant mechanical symptoms associated with area of heterotopic ossification so I am less hopeful that physical therapy would provide any type of meaningful difference as his previous home exercise rotator cuff programs have led to aggravation of the symptoms.  At this time given his young age and desire to stay active, I do believe that he would benefit from a surgery to remove his focus of heterotopic ossification.  I would plan to send this for pathology as well.  I would plan to repair the subscapularis tendon in an open fashion at that time.  We did discuss that  he does also have some biceps symptoms as well.  There is evidence of vital sign of subluxation on the MRI.  As result I do believe that he would benefit from a biceps tenodesis at the time of open subscapularis repair.  After discussion of his options at some length.  He has opted for surgical intervention.   PAIN:  Are you having pain? Yes: NPRS scale: 0/10 Pain location: L shoulder  Pain description: not centralized, more like a light throb  Aggravating factors: sudden jolts  Relieving factors: pain medicine, in or out of sling depending   PRECAUTIONS: Shoulder subscap protocol   WEIGHT BEARING RESTRICTIONS Yes NWB   FALLS:  Has patient fallen in last 6 months? No  LIVING ENVIRONMENT: Lives with: lives with their family Lives in: House/apartment Stairs: 5 STE B, flight U rail  Has following equipment at home:  ice machine   OCCUPATION: Engineer   PLOF: Independent, Independent with basic ADLs, Independent with gait, and Independent with transfers  PATIENT GOALS back to 100%, be pain free, get ROM back, recreational sports   OBJECTIVE:   UPPER EXTREMITY ROM:    AROM Left 10/18  Shoulder flexion 155  Shoulder extension 35   Shoulder abduction 140  Shoulder adduction    Shoulder internal rotation To belly  Shoulder external rotation 40  (Blank rows = not tested)  PROM WNL no pain with movements  No step-off deformity noted.  No signs of erythema or bruising.    TODAY'S TREATMENT:  11/13   STM L biceps and ant deltoid  Seated cable row 3x8 55lbs  High cable row 7.5lbs 3x10 Cable press 10lbs 3x8 Cable OH triceps 12.5 lbs 2x10 10lb ball OH to simulate setting box on shelf  11/6  UBE L3 2 mins retro and fwd   -PNF manual resisted D1/D2 3x10 golf club ER stretch 10s 5x Single arm cable chest press 10 lbs 2x10 Table push up 3x6  Practice back swing to impact position (no speed)  Exercises - Single Arm Doorway Pec Stretch at 90 Degrees Abduction  - 2  x daily - 7 x weekly - 1 sets - 3 reps - 30 hold - Standing Single Arm Shoulder Internal Rotation in Abduction with Anchored Resistance  - 1 x daily - 2 x weekly - 3 sets - 10 reps - Standing Single Arm Shoulder External Rotation in Abduction with Anchored Resistance  - 1 x daily - 2 x weekly - 3 sets - 10 reps - Shoulder Overhead Press in Flexion with Dumbbells  - 1 x daily - 2 x weekly - 3 sets - 10 reps - Plank Shoulder Protraction Clocks on BOSU  - 1 x daily - 2 x weekly - 3 sets - 10 reps  10/30  UBE L1 2 mins retro and fwd Program Notes Hand behind back, tie band to post, wrap through elbow and rotate to stretch 5s 10xBanded OH lat stretch and banded pec stretch 5s 10x  Exercises -straight arm scap retraction with heavy green band 3x10 circles CW and CCW -triceps extension rope 17.5lbs 3x10 -chest height row/pull 12.5 lbs 3x8 -standing cable row 17.5lbs 3x8 -bicep curls 3x8 -wall push up 3x8   10/23 L  GHJ ant grade IV joint mob   Program Notes Hand behind back, tie band to post, wrap through elbow and rotate to stretch 5s 10xBanded OH lat stretch and banded pec stretch 5s 10x  Exercises - Single Arm Doorway Pec Stretch at 60 Elevation  - 2 x daily - 7 x weekly - 1 sets - 3 reps - 30 hold - Shoulder External Rotation and Scapular Retraction with Resistance  - 1 x daily - 2 x weekly - 3 sets - 10 reps - Standing Shoulder Horizontal Abduction with Resistance  - 1 x daily - 2 x weekly - 3 sets - 10 reps - Scaption with Dumbbells  - 1 x daily - 2 x weekly - 2-3 sets - 8 reps - Standing Bent Over Single Arm Scapular Row with Table Support  - 1 x daily - 2 x weekly - 3 sets - 8 reps - Full Plank  - 1 x daily - 2 x weekly - 1 sets - 4 reps - 20s hold - Shoulder Internal Rotation with Resistance  - 1 x daily - 2 x weekly - 2 sets - 10 reps   PATIENT EDUCATION: Education details: biomechanics, periodization, anatomy, exercise progression, DOMS expectations, muscle firing,   envelope of function, HEP, POC  Person educated: Patient Education method: Explanation, Demonstration, and Handouts Education comprehension: verbalized understanding and returned demonstration   HOME EXERCISE PROGRAM: Access Code: [email or web address] URL: [email or web address] Date: 03/26/2022 Prepared by: Zebedee Iba    ASSESSMENT:  CLINICAL IMPRESSION: Pt is nearly 12 wks at this time and able to continue with progression of strengthening. Pt presents following report of child jumping onto the L UE and causing him some soreness and discomfort with fwd flexion and pushing type motions. Exam today does not suggest damage to biceps tenodesis or high grade strain. Pt able to slowly re-introduce resisted movements at this session and advised to slowly return to previous exercises as instructed. Consider light plyo or slow toss intro at next if no continued pain or issues. Pt would benefit from continued skilled therapy in order to reach goals and maximize functional L UE strength and ROM for full return to PLOF. Pt advised to avoid discomfort or pain with lifting.  OBJECTIVE IMPAIRMENTS decreased ROM, decreased strength, hypomobility, increased edema, increased fascial restrictions, impaired flexibility, impaired UE functional use, and pain.   ACTIVITY  LIMITATIONS carrying, lifting, bed mobility, bathing, toileting, dressing, self feeding, reach over head, hygiene/grooming, and caring for others  PARTICIPATION LIMITATIONS: meal prep, cleaning, laundry, interpersonal relationship, driving, shopping, community activity, and occupation  PERSONAL FACTORS Age, Past/current experiences, and Time since onset of injury/illness/exacerbation are also affecting patient's functional outcome.   REHAB POTENTIAL: Excellent  CLINICAL DECISION MAKING: Stable/uncomplicated  EVALUATION COMPLEXITY: Low   GOALS: Goals reviewed with patient? Yes  SHORT TERM GOALS: Target date: 04/11/2022  (Remove Blue  Hyperlink)  L shoulder flexion PROM and AAROM to be at least 150 degrees Baseline: Goal status: MET  2.  L shoulder ABD PROM to be 90 degrees  Baseline:  Goal status: MET  3.  L shoulder ER PROM to be 45 degrees  Baseline:  Goal status: MET  4.  Will be compliant with appropriate progressive HEP  Baseline:  Goal status:MET   LONG TERM GOALS: Target date: 08/25/2022   L shoulder PROM and AROM to be full in all planes of motion Baseline:  Goal status: ongoing  2.  MMT to be at least 4/5 in all groups L shoulder  Baseline:  Goal status:MET  3.  Will be able to reach overhead without increase in pain or poor scapular mechanics  Baseline:  Goal status: MET  4.  Will have returned to light gym based exercise program as appropriate dependent on progress post-op  Baseline:  Goal status: ongoing  5. Pt will be able to reach [facility] and carry/hold >10 lbs in order to demonstrate functional improvement in L UE strength for return to PLOF and exercise.    Goal status: new   6.Pt will be able to demonstrate full golf swing without pain  in order to demonstrate functional improvement in UE/LE function for self-care and house hold duties.    Goal status: new PLAN: PT FREQUENCY: 1x/week  PT DURATION: 12 wks  PLANNED INTERVENTIONS: Therapeutic exercises, Therapeutic activity, Neuromuscular re-education, Balance training, Gait training, Patient/Family education, Self Care, Joint mobilization, Dry Needling, Electrical stimulation, Cryotherapy, Moist heat, Taping, Ultrasound, Ionotophoresis 4mg /ml Dexamethasone, Manual therapy, and Re-evaluation  PLAN FOR NEXT SESSION:  Cont with Dr. Serena Croissant Subscapularis repair protocol with regards to biceps tenodesis.  Zebedee Iba PT, DPT 06/02/22 10:36 AM

## 2022-06-02 NOTE — Progress Notes (Signed)
Post Operative Evaluation    Procedure/Date of Surgery: Left shoulder removal of loose body and subscapularis repair 03/11/22  Interval History:    Today 3 months status post the above procedure.  He is experiencing some tenderness along the biceps anteriorly recently particularly after he went to lift his child off and switch arms.  Overall he is continuing to improve with physical therapy.  His strength is slowly progressing. PMH/PSH/Family History/Social History/Meds/Allergies:    Past Medical History:  Diagnosis Date   Anxiety    Past Surgical History:  Procedure Laterality Date   CLAVICLE SURGERY  2011   broke collar bone skiing   LIPOMA EXCISION  2007   benign lipoma in colon, caused intersepcion   SHOULDER ARTHROSCOPY WITH DEBRIDEMENT AND BICEP TENDON REPAIR Left 03/11/2022   Procedure: LEFT SHOULDER ARTHROSCOPY WITH MINI OPEN SUBSCAPULARIS REPAIR  AND BICEP TENDODESIS;  Surgeon: Huel Cote, MD;  Location: [facility];  Service: Orthopedics;  Laterality: Left;   Social History   Socioeconomic History   Marital status: Married    Spouse name: Fleet Contras   Number of children: Not on file   Years of education: Not on file   Highest education level: Master's degree (e.g., MA, MS, MEng, MEd, MSW, MBA)  Occupational History   Not on file  Tobacco Use   Smoking status: Never   Smokeless tobacco: Never  Vaping Use   Vaping Use: Not on file  Substance and Sexual Activity   Alcohol use: Yes    Comment: occas   Drug use: Never   Sexual activity: Yes    Partners: Female  Other Topics Concern   Not on file  Social History Narrative   Not on file   Social Determinants of Health   Financial Resource Strain: Low Risk  (12/28/2021)   Overall Financial Resource Strain (CARDIA)    Difficulty of Paying Living Expenses: Not hard at all  Food Insecurity: No Food Insecurity (12/28/2021)   Hunger Vital Sign    Worried About  Running Out of Food in the Last Year: Never true    Ran Out of Food in the Last Year: Never true  Transportation Needs: No Transportation Needs (12/28/2021)   PRAPARE - Administrator, Civil Service (Medical): No    Lack of Transportation (Non-Medical): No  Physical Activity: Insufficiently Active (12/28/2021)   Exercise Vital Sign    Days of Exercise per Week: 4 days    Minutes of Exercise per Session: 30 min  Stress: No Stress Concern Present (12/28/2021)   [employer] of Occupational Health - Occupational Stress Questionnaire    Feeling of Stress : Only a little  Social Connections: Unknown (12/28/2021)   Social Connection and Isolation Panel [NHANES]    Frequency of Communication with Friends and Family: Twice a week    Frequency of Social Gatherings with Friends and Family: Once a week    Attends Religious Services: Patient refused    Database administrator or Organizations: Yes    Attends Engineer, structural: More than 4 times per year    Marital Status: Married   Family History  Problem Relation Age of Onset   Anxiety disorder Mother    Cancer Mother        melanoma   Miscarriages / [place] Mother  Asthma Brother    Arthritis Maternal Grandmother    Cancer Maternal Grandfather    Heart attack Maternal Grandfather    No Known Allergies Current Outpatient Medications  Medication Sig Dispense Refill   Ascorbic Acid (VITAMIN C) 500 MG CAPS Take by mouth.     aspirin EC 325 MG tablet Take 1 tablet (325 mg total) by mouth daily. 30 tablet 0   Cholecalciferol (VITAMIN D3) 125 MCG (5000 UT) TABS Take by mouth.     cyanocobalamin (VITAMIN B12) 500 MCG tablet Take 500 mcg by mouth daily.     escitalopram (LEXAPRO) 10 MG tablet TAKE 1 TABLET(10 MG) BY MOUTH DAILY 90 tablet 2   fexofenadine (ALLEGRA) 180 MG tablet Take 180 mg by mouth daily.     oxycodone (OXY-IR) 5 MG capsule Take 1 capsule (5 mg total) by mouth every 4 (four) hours as needed (severe  pain). 20 capsule 0   No current facility-administered medications for this visit.   No results found.  Review of Systems:   A ROS was performed including pertinent positives and negatives as documented in the HPI.   Musculoskeletal Exam:    There were no vitals taken for this visit.  Left shoulder portals are healed.  Able to flex and extend at the left elbow.  Active forward elevation of the left shoulder is 170 degrees equal to the contralateral side, external rotation actively is to 70 degrees equal to the contralateral side.  Internal rotation is to T12 bilaterally.  Distal neurosensory exam is intact.  There is some weakness on the left compared to the contralateral side with forward elevation  Imaging:      I personally reviewed and interpreted the radiographs.   Assessment:   12 weeks status post left shoulder removal of loose body, subscapularis repair, biceps tendon repair overall doing well.  This time I do believe he does have a component of anterior shoulder overload with some posterior scapular weakness.  I would like him to continue to work on some posterior scapular strengthening and I did instruct him on specific row exercises that he can do for this.  I will plan to see him back in 2 months for likely final check.  He will continue physical therapy Plan :    -Return to clinic in 8 weeks      I personally saw and evaluated the patient, and participated in the management and treatment plan.  Huel Cote, MD Attending Physician, Orthopedic Surgery  This document was dictated using Dragon voice recognition software. A reasonable attempt at proof reading has been made to minimize errors.

## 2022-06-05 ENCOUNTER — Encounter (HOSPITAL_BASED_OUTPATIENT_CLINIC_OR_DEPARTMENT_OTHER): Payer: Managed Care, Other (non HMO) | Admitting: Physical Therapy

## 2022-06-16 ENCOUNTER — Ambulatory Visit (HOSPITAL_BASED_OUTPATIENT_CLINIC_OR_DEPARTMENT_OTHER): Payer: Managed Care, Other (non HMO) | Admitting: Physical Therapy

## 2022-06-16 ENCOUNTER — Encounter (HOSPITAL_BASED_OUTPATIENT_CLINIC_OR_DEPARTMENT_OTHER): Payer: Self-pay | Admitting: Physical Therapy

## 2022-06-16 DIAGNOSIS — R600 Localized edema: Secondary | ICD-10-CM

## 2022-06-16 DIAGNOSIS — M25512 Pain in left shoulder: Secondary | ICD-10-CM

## 2022-06-16 DIAGNOSIS — M25612 Stiffness of left shoulder, not elsewhere classified: Secondary | ICD-10-CM

## 2022-06-16 DIAGNOSIS — M6281 Muscle weakness (generalized): Secondary | ICD-10-CM

## 2022-06-16 NOTE — Therapy (Addendum)
OUTPATIENT PHYSICAL THERAPY TREATMENT NOTE  PHYSICAL THERAPY DISCHARGE SUMMARY  Visits from Start of Care: 15  Plan: Patient agrees to discharge.  Patient goals were mostly met. Patient is being discharged due to feeling satisfied with current level of function.        Patient Name: Kent Johnson MRN: [id number] DOB:[birth year], 36 y.o., male Today's Date: 06/16/2022   PT End of Session - 06/16/22 0939     Visit Number 15    Number of Visits 24    Date for PT Re-Evaluation 08/05/22    Authorization Type [employer]    Authorization Time Period 03/14/22 to 05/09/22    PT Start Time 0932    PT Stop Time 1013    PT Time Calculation (min) 41 min    Activity Tolerance Patient tolerated treatment well    Behavior During Therapy [facility] for tasks assessed/performed                        History reviewed. No pertinent past medical history.  Past Surgical History:  Procedure Laterality Date   CLAVICLE SURGERY  2011   broke collar bone skiing   LIPOMA EXCISION  2007   benign lipoma in colon, caused intersepcion   SHOULDER ARTHROSCOPY WITH DEBRIDEMENT AND BICEP TENDON REPAIR Left 03/11/2022   Procedure: LEFT SHOULDER ARTHROSCOPY WITH MINI OPEN SUBSCAPULARIS REPAIR  AND BICEP TENDODESIS;  Surgeon: Huel Cote, MD;  Location: [facility];  Service: Orthopedics;  Laterality: Left;   WOUND EXPLORATION N/A 05/23/2022   Procedure: WOUND EXPLORATION WITH REMOVAL OF FOREIGN BODY OF ABDOMINAL WALL;  Surgeon: Darnell Level, MD;  Location: [facility];  Service: General;  Laterality: N/A;   Patient Active Problem List   Diagnosis Date Noted   Foreign body in anterior abdominal wall 05/18/2022   Avulsion of left subscapularis    Anxiety disorder 11/11/2021   Insomnia 11/11/2021    PCP: [name], [name]   REFERRING PROVIDER: Huel Cote, MD   REFERRING DIAG: [phone] (ICD-10-CM) - Avulsion of left subscapularis, initial encounter   THERAPY DIAG:  Acute  pain of left shoulder  Localized edema  Stiffness of left shoulder, not elsewhere classified  Muscle weakness (generalized)  Rationale for Evaluation and Treatment Rehabilitation  ONSET DATE: 02/03/2022, DOS 8/22      SUBJECTIVE:                                                                                                                                                                                      SUBJECTIVE STATEMENT: Pt is nearly 14 wks s/p L subscapularis repair and biceps tenodesis.    Pt states  the shoulder is progressively strengthening. Pt has returned to the gym without issues. Raising his arm while laying down is the only thing that is still painful.  PERTINENT HISTORY: L shoulder Arthroscopic rotator cuff repair (subscapularis) and biceps tenodesis on 03/11/22 36 y.o. 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  I did discuss treatment options with him.  At this time his mechanical symptoms continue to be quite bothersome for him.  He does have noticeable weakness in the setting of a tear of the subscapularis which is likely been going on for quite some time.  I did discuss possible treatment options.  At this time I do not feel an injection would benefit him significantly as this would not remove his focus of heterotopic ossification.  I did discuss that a formal rehab protocol could provide some benefit although that being said he does continue to experience significant mechanical symptoms associated with area of heterotopic ossification so I am less hopeful that physical therapy would provide any type of meaningful difference as his previous home exercise rotator cuff programs have led to aggravation of the symptoms.  At this time given his young age and desire to stay active, I do believe that he would benefit from a surgery to remove his focus of heterotopic ossification.  I  would plan to send this for pathology as well.  I would plan to repair the subscapularis tendon in an open fashion at that time.  We did discuss that he does also have some biceps symptoms as well.  There is evidence of vital sign of subluxation on the MRI.  As result I do believe that he would benefit from a biceps tenodesis at the time of open subscapularis repair.  After discussion of his options at some length.  He has opted for surgical intervention.   PAIN:  Are you having pain? Yes: NPRS scale: 0/10 Pain location: L shoulder  Pain description: not centralized, more like a light throb  Aggravating factors: sudden jolts  Relieving factors: pain medicine, in or out of sling depending   PRECAUTIONS: Shoulder subscap protocol   WEIGHT BEARING RESTRICTIONS Yes NWB   FALLS:  Has patient fallen in last 6 months? No  LIVING ENVIRONMENT: Lives with: lives with their family Lives in: House/apartment Stairs: 5 STE B, flight U rail  Has following equipment at home:  ice machine   OCCUPATION: Engineer   PLOF: Independent, Independent with basic ADLs, Independent with gait, and Independent with transfers  PATIENT GOALS back to 100%, be pain free, get ROM back, recreational sports   OBJECTIVE:   UPPER EXTREMITY ROM:    AROM Left 10/18  Shoulder flexion 155  Shoulder extension 35   Shoulder abduction 140  Shoulder adduction    Shoulder internal rotation To belly  Shoulder external rotation 40  (Blank rows = not tested)  PROM WNL no pain with movements  No step-off deformity noted.  No signs of erythema or bruising.    TODAY'S TREATMENT:   11/27  UBE L3 2 min fwd and retro  Barbell bench press form and technique check; 10lb DB bench 2x8 Bird dog row 2x10 15lbs Farmer's carry 46lbs 65ft 3x laps Rebounder 62ft 1.1 lbs ball OH throw 3x10  Return to throwing program; start at week 16; return to swimming, return to golf  11/13   STM L biceps and ant deltoid  Seated  cable row 3x8 55lbs  High cable row  7.5lbs 3x10 Cable press 10lbs 3x8 Cable OH triceps 12.5 lbs 2x10 10lb ball OH to simulate setting box on shelf  11/6  UBE L3 2 mins retro and fwd   -PNF manual resisted D1/D2 3x10 golf club ER stretch 10s 5x Single arm cable chest press 10 lbs 2x10 Table push up 3x6  Practice back swing to impact position (no speed)  Exercises - Single Arm Doorway Pec Stretch at 90 Degrees Abduction  - 2 x daily - 7 x weekly - 1 sets - 3 reps - 30 hold - Standing Single Arm Shoulder Internal Rotation in Abduction with Anchored Resistance  - 1 x daily - 2 x weekly - 3 sets - 10 reps - Standing Single Arm Shoulder External Rotation in Abduction with Anchored Resistance  - 1 x daily - 2 x weekly - 3 sets - 10 reps - Shoulder Overhead Press in Flexion with Dumbbells  - 1 x daily - 2 x weekly - 3 sets - 10 reps - Plank Shoulder Protraction Clocks on BOSU  - 1 x daily - 2 x weekly - 3 sets - 10 reps  10/30  UBE L1 2 mins retro and fwd Program Notes Hand behind back, tie band to post, wrap through elbow and rotate to stretch 5s 10xBanded OH lat stretch and banded pec stretch 5s 10x  Exercises -straight arm scap retraction with heavy green band 3x10 circles CW and CCW -triceps extension rope 17.5lbs 3x10 -chest height row/pull 12.5 lbs 3x8 -standing cable row 17.5lbs 3x8 -bicep curls 3x8 -wall push up 3x8   10/23 L  GHJ ant grade IV joint mob   Program Notes Hand behind back, tie band to post, wrap through elbow and rotate to stretch 5s 10xBanded OH lat stretch and banded pec stretch 5s 10x  Exercises - Single Arm Doorway Pec Stretch at 60 Elevation  - 2 x daily - 7 x weekly - 1 sets - 3 reps - 30 hold - Shoulder External Rotation and Scapular Retraction with Resistance  - 1 x daily - 2 x weekly - 3 sets - 10 reps - Standing Shoulder Horizontal Abduction with Resistance  - 1 x daily - 2 x weekly - 3 sets - 10 reps - Scaption with Dumbbells  - 1 x daily -  2 x weekly - 2-3 sets - 8 reps - Standing Bent Over Single Arm Scapular Row with Table Support  - 1 x daily - 2 x weekly - 3 sets - 8 reps - Full Plank  - 1 x daily - 2 x weekly - 1 sets - 4 reps - 20s hold - Shoulder Internal Rotation with Resistance  - 1 x daily - 2 x weekly - 2 sets - 10 reps   PATIENT EDUCATION: Education details: biomechanics, periodization, anatomy, exercise progression, DOMS expectations, muscle firing,  envelope of function, HEP, POC  Person educated: Patient Education method: Explanation, Demonstration, and Handouts Education comprehension: verbalized understanding and returned demonstration   HOME EXERCISE PROGRAM: Access Code: [email or web address]  URL: [email or web address] Date: 03/26/2022 Prepared by: Zebedee Iba    ASSESSMENT:  CLINICAL IMPRESSION: Pt is nearly 14 wks at this time and able to continue with progression of strengthening.  Patient home exercise progress today to progress left upper extremity shoulder stability and endurance.  Patient able to progress all intensity with gym-based exercise as long as there is no pain.  Patient did have decreased pain with bench pressing with decreased weight, advised to start off with light  dumbbell press with focus on proper set up technique.  Reviewed return to swimming,throwing, golf, and gym-based exercise with emphasis placed on graded progression.  Consider formal measurements and DC at next visit if patient has no complaints. pt would benefit from continued skilled therapy in order to reach goals and maximize functional L UE strength and ROM for full return to PLOF. Pt advised to avoid discomfort or pain with lifting.  OBJECTIVE IMPAIRMENTS decreased ROM, decreased strength, hypomobility, increased edema, increased fascial restrictions, impaired flexibility, impaired UE functional use, and pain.   ACTIVITY LIMITATIONS carrying, lifting, bed mobility, bathing, toileting, dressing, self feeding, reach  over head, hygiene/grooming, and caring for others  PARTICIPATION LIMITATIONS: meal prep, cleaning, laundry, interpersonal relationship, driving, shopping, community activity, and occupation  PERSONAL FACTORS Age, Past/current experiences, and Time since onset of injury/illness/exacerbation are also affecting patient's functional outcome.   REHAB POTENTIAL: Excellent  CLINICAL DECISION MAKING: Stable/uncomplicated  EVALUATION COMPLEXITY: Low   GOALS: Goals reviewed with patient? Yes  SHORT TERM GOALS: Target date: 04/11/2022  (Remove Blue Hyperlink)  L shoulder flexion PROM and AAROM to be at least 150 degrees Baseline: Goal status: MET  2.  L shoulder ABD PROM to be 90 degrees  Baseline:  Goal status: MET  3.  L shoulder ER PROM to be 45 degrees  Baseline:  Goal status: MET  4.  Will be compliant with appropriate progressive HEP  Baseline:  Goal status:MET   LONG TERM GOALS: Target date: 09/08/2022   L shoulder PROM and AROM to be full in all planes of motion Baseline:  Goal status: ongoing  2.  MMT to be at least 4/5 in all groups L shoulder  Baseline:  Goal status:MET  3.  Will be able to reach overhead without increase in pain or poor scapular mechanics  Baseline:  Goal status: MET  4.  Will have returned to light gym based exercise program as appropriate dependent on progress post-op  Baseline:  Goal status: ongoing  5. Pt will be able to reach [facility] and carry/hold >10 lbs in order to demonstrate functional improvement in L UE strength for return to PLOF and exercise.    Goal status: new   6.Pt will be able to demonstrate full golf swing without pain  in order to demonstrate functional improvement in UE/LE function for self-care and house hold duties.    Goal status: new PLAN: PT FREQUENCY: 1x/week  PT DURATION: 12 wks  PLANNED INTERVENTIONS: Therapeutic exercises, Therapeutic activity, Neuromuscular re-education, Balance training, Gait training,  Patient/Family education, Self Care, Joint mobilization, Dry Needling, Electrical stimulation, Cryotherapy, Moist heat, Taping, Ultrasound, Ionotophoresis 4mg /ml Dexamethasone, Manual therapy, and Re-evaluation  PLAN FOR NEXT SESSION:  Cont with Dr. Serena Croissant Subscapularis repair protocol with regards to biceps tenodesis.  Zebedee Iba PT, DPT 06/16/22 10:18 AM

## 2022-06-30 ENCOUNTER — Encounter (HOSPITAL_BASED_OUTPATIENT_CLINIC_OR_DEPARTMENT_OTHER): Payer: Managed Care, Other (non HMO) | Admitting: Physical Therapy

## 2022-07-15 ENCOUNTER — Encounter (HOSPITAL_BASED_OUTPATIENT_CLINIC_OR_DEPARTMENT_OTHER): Payer: Self-pay | Admitting: Physical Therapy

## 2022-07-15 ENCOUNTER — Ambulatory Visit (HOSPITAL_BASED_OUTPATIENT_CLINIC_OR_DEPARTMENT_OTHER): Payer: Managed Care, Other (non HMO) | Attending: Orthopaedic Surgery | Admitting: Physical Therapy

## 2022-07-15 DIAGNOSIS — M6281 Muscle weakness (generalized): Secondary | ICD-10-CM | POA: Insufficient documentation

## 2022-07-15 DIAGNOSIS — M25612 Stiffness of left shoulder, not elsewhere classified: Secondary | ICD-10-CM | POA: Insufficient documentation

## 2022-07-15 DIAGNOSIS — R600 Localized edema: Secondary | ICD-10-CM | POA: Diagnosis present

## 2022-07-15 DIAGNOSIS — M25512 Pain in left shoulder: Secondary | ICD-10-CM | POA: Insufficient documentation

## 2022-07-15 NOTE — Therapy (Signed)
OUTPATIENT PHYSICAL THERAPY TREATMENT NOTE   Patient Name: Kent Johnson MRN: [id number] DOB:1986-02-23, 36 y.o., male Today's Date: 07/15/2022   PT End of Session - 07/15/22 0934     Visit Number 16    Number of Visits 24    Date for PT Re-Evaluation 08/05/22    Authorization Type [employer]    Authorization Time Period 03/14/22 to 05/09/22    PT Start Time 0930    PT Stop Time 1012    PT Time Calculation (min) 42 min    Activity Tolerance Patient tolerated treatment well    Behavior During Therapy [facility] for tasks assessed/performed                        History reviewed. No pertinent past medical history.  Past Surgical History:  Procedure Laterality Date   CLAVICLE SURGERY  2011   broke collar bone skiing   LIPOMA EXCISION  2007   benign lipoma in colon, caused intersepcion   SHOULDER ARTHROSCOPY WITH DEBRIDEMENT AND BICEP TENDON REPAIR Left 03/11/2022   Procedure: LEFT SHOULDER ARTHROSCOPY WITH MINI OPEN SUBSCAPULARIS REPAIR  AND BICEP TENDODESIS;  Surgeon: Huel Cote, MD;  Location: [facility];  Service: Orthopedics;  Laterality: Left;   WOUND EXPLORATION N/A 05/23/2022   Procedure: WOUND EXPLORATION WITH REMOVAL OF FOREIGN BODY OF ABDOMINAL WALL;  Surgeon: Darnell Level, MD;  Location: [facility];  Service: General;  Laterality: N/A;   Patient Active Problem List   Diagnosis Date Noted   Foreign body in anterior abdominal wall 05/18/2022   Avulsion of left subscapularis    Anxiety disorder 11/11/2021   Insomnia 11/11/2021    PCP: [name], [name]   REFERRING PROVIDER: Huel Cote, MD   REFERRING DIAG: [phone] (ICD-10-CM) - Avulsion of left subscapularis, initial encounter   THERAPY DIAG:  Acute pain of left shoulder  Localized edema  Stiffness of left shoulder, not elsewhere classified  Muscle weakness (generalized)  Rationale for Evaluation and Treatment Rehabilitation  ONSET DATE: 02/03/2022, DOS  8/22      SUBJECTIVE:                                                                                                                                                                                      SUBJECTIVE STATEMENT: Has been progressing weight and feeling good until I went to lift a suitcase and felt like it strained. It feels better today.    PERTINENT HISTORY: L shoulder Arthroscopic rotator cuff repair (subscapularis) and biceps tenodesis on 03/11/22 36 y.o. 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  I did discuss treatment options with him.  At this time his mechanical symptoms continue to be quite bothersome for him.  He does have noticeable weakness in the setting of a tear of the subscapularis which is likely been going on for quite some time.  I did discuss possible treatment options.  At this time I do not feel an injection would benefit him significantly as this would not remove his focus of heterotopic ossification.  I did discuss that a formal rehab protocol could provide some benefit although that being said he does continue to experience significant mechanical symptoms associated with area of heterotopic ossification so I am less hopeful that physical therapy would provide any type of meaningful difference as his previous home exercise rotator cuff programs have led to aggravation of the symptoms.  At this time given his young age and desire to stay active, I do believe that he would benefit from a surgery to remove his focus of heterotopic ossification.  I would plan to send this for pathology as well.  I would plan to repair the subscapularis tendon in an open fashion at that time.  We did discuss that he does also have some biceps symptoms as well.  There is evidence of vital sign of subluxation on the MRI.  As result I do believe that he would benefit from a biceps tenodesis at the time  of open subscapularis repair.  After discussion of his options at some length.  He has opted for surgical intervention.   PAIN:  Are you having pain? Yes: NPRS scale: 0/10 Pain location: L shoulder  Pain description: not centralized, more like a light throb  Aggravating factors: sudden jolts  Relieving factors: pain medicine, in or out of sling depending   PRECAUTIONS: Shoulder subscap protocol   WEIGHT BEARING RESTRICTIONS Yes NWB   FALLS:  Has patient fallen in last 6 months? No  LIVING ENVIRONMENT: Lives with: lives with their family Lives in: House/apartment Stairs: 5 STE B, flight U rail  Has following equipment at home:  ice machine   OCCUPATION: Engineer   PLOF: Independent, Independent with basic ADLs, Independent with gait, and Independent with transfers  PATIENT GOALS back to 100%, be pain free, get ROM back, recreational sports   OBJECTIVE:   UPPER EXTREMITY ROM:    AROM Left 10/18 Left  12/26  Shoulder flexion 155 150  Shoulder extension 35  50  Shoulder abduction 140 155  Shoulder adduction     Shoulder internal rotation To belly T10  Shoulder external rotation 40 T4  (Blank rows = not tested)  PROM WNL no pain with movements  No step-off deformity noted.  No signs of erythema or bruising.  12/26 FOTO 75    TODAY'S TREATMENT:   Treatment                            12/26:  Deltoid straight plane motions with 5 pounds, options for variations presented 2.5 plate cable for D1-D2 flexion and extension Side plank on elbow and knee with variations presented   11/27  UBE L3 2 min fwd and retro  Barbell bench press form and technique check; 10lb DB bench 2x8 Bird dog row 2x10 15lbs Farmer's carry 46lbs 40ft 3x laps Rebounder 62ft 1.1 lbs ball OH throw 3x10  Return to throwing program; start at week 16; return to swimming, return to golf  11/13   STM L biceps and ant deltoid  Seated cable row 3x8 55lbs  High cable row 7.5lbs 3x10 Cable  press 10lbs 3x8 Cable OH triceps 12.5 lbs 2x10 10lb ball OH to simulate setting box on shelf  11/6  UBE L3 2 mins retro and fwd   -PNF manual resisted D1/D2 3x10 golf club ER stretch 10s 5x Single arm cable chest press 10 lbs 2x10 Table push up 3x6  Practice back swing to impact position (no speed)  Exercises - Single Arm Doorway Pec Stretch at 90 Degrees Abduction  - 2 x daily - 7 x weekly - 1 sets - 3 reps - 30 hold - Standing Single Arm Shoulder Internal Rotation in Abduction with Anchored Resistance  - 1 x daily - 2 x weekly - 3 sets - 10 reps - Standing Single Arm Shoulder External Rotation in Abduction with Anchored Resistance  - 1 x daily - 2 x weekly - 3 sets - 10 reps - Shoulder Overhead Press in Flexion with Dumbbells  - 1 x daily - 2 x weekly - 3 sets - 10 reps - Plank Shoulder Protraction Clocks on BOSU  - 1 x daily - 2 x weekly - 3 sets - 10 reps  10/30  UBE L1 2 mins retro and fwd Program Notes Hand behind back, tie band to post, wrap through elbow and rotate to stretch 5s 10xBanded OH lat stretch and banded pec stretch 5s 10x  Exercises -straight arm scap retraction with heavy green band 3x10 circles CW and CCW -triceps extension rope 17.5lbs 3x10 -chest height row/pull 12.5 lbs 3x8 -standing cable row 17.5lbs 3x8 -bicep curls 3x8 -wall push up 3x8   10/23 L  GHJ ant grade IV joint mob   Program Notes Hand behind back, tie band to post, wrap through elbow and rotate to stretch 5s 10xBanded OH lat stretch and banded pec stretch 5s 10x  Exercises - Single Arm Doorway Pec Stretch at 60 Elevation  - 2 x daily - 7 x weekly - 1 sets - 3 reps - 30 hold - Shoulder External Rotation and Scapular Retraction with Resistance  - 1 x daily - 2 x weekly - 3 sets - 10 reps - Standing Shoulder Horizontal Abduction with Resistance  - 1 x daily - 2 x weekly - 3 sets - 10 reps - Scaption with Dumbbells  - 1 x daily - 2 x weekly - 2-3 sets - 8 reps - Standing Bent Over  Single Arm Scapular Row with Table Support  - 1 x daily - 2 x weekly - 3 sets - 8 reps - Full Plank  - 1 x daily - 2 x weekly - 1 sets - 4 reps - 20s hold - Shoulder Internal Rotation with Resistance  - 1 x daily - 2 x weekly - 2 sets - 10 reps   PATIENT EDUCATION: Education details: biomechanics, periodization, anatomy, exercise progression, DOMS expectations, muscle firing,  envelope of function, HEP, POC  Person educated: Patient Education method: Explanation, Demonstration, and Handouts Education comprehension: verbalized understanding and returned demonstration   HOME EXERCISE PROGRAM: Access Code: [email or web address]  URL: [email or web address] Date: 03/26/2022    ASSESSMENT:  CLINICAL IMPRESSION: Complains of signs and symptoms consistent with mild strain after lifting a heavy suitcase.  But overall seems to be healing well.  Continue to add options for larger amplitude motions in the gym to challenge functional ranges of the shoulder.  He will continue to work on these and follow-up with Hessie Diener on the 16th.  Encouraged him to  reach out to [place] with any questions.  OBJECTIVE IMPAIRMENTS decreased ROM, decreased strength, hypomobility, increased edema, increased fascial restrictions, impaired flexibility, impaired UE functional use, and pain.   ACTIVITY LIMITATIONS carrying, lifting, bed mobility, bathing, toileting, dressing, self feeding, reach over head, hygiene/grooming, and caring for others  PARTICIPATION LIMITATIONS: meal prep, cleaning, laundry, interpersonal relationship, driving, shopping, community activity, and occupation  PERSONAL FACTORS Age, Past/current experiences, and Time since onset of injury/illness/exacerbation are also affecting patient's functional outcome.     GOALS: Goals reviewed with patient? Yes  SHORT TERM GOALS: Target date: 04/11/2022  (Remove Blue Hyperlink)  L shoulder flexion PROM and AAROM to be at least 150 degrees Baseline: Goal  status: MET  2.  L shoulder ABD PROM to be 90 degrees  Baseline:  Goal status: MET  3.  L shoulder ER PROM to be 45 degrees  Baseline:  Goal status: MET  4.  Will be compliant with appropriate progressive HEP  Baseline:  Goal status:MET   LONG TERM GOALS: Target date: POC date   L shoulder PROM and AROM to be full in all planes of motion Baseline:  Goal status: ongoing  2.  MMT to be at least 4/5 in all groups L shoulder  Baseline:  Goal status:MET  3.  Will be able to reach overhead without increase in pain or poor scapular mechanics  Baseline:  Goal status: MET  4.  Will have returned to light gym based exercise program as appropriate dependent on progress post-op  Baseline:  Goal status: ongoing  5. Pt will be able to reach [facility] and carry/hold >10 lbs in order to demonstrate functional improvement in L UE strength for return to PLOF and exercise.    Goal status: new   6.Pt will be able to demonstrate full golf swing without pain  in order to demonstrate functional improvement in UE/LE function for self-care and house hold duties.    Goal status: new PLAN: PT FREQUENCY: 1x/week  PT DURATION: 12 wks  PLANNED INTERVENTIONS: Therapeutic exercises, Therapeutic activity, Neuromuscular re-education, Balance training, Gait training, Patient/Family education, Self Care, Joint mobilization, Dry Needling, Electrical stimulation, Cryotherapy, Moist heat, Taping, Ultrasound, Ionotophoresis 4mg /ml Dexamethasone, Manual therapy, and Re-evaluation  PLAN FOR NEXT SESSION:  Cont with Dr. Serena Croissant Subscapularis repair protocol with regards to biceps tenodesis.  Priscella Donna C. Jamai Dolce PT, DPT 07/15/22 10:15 AM

## 2022-07-28 ENCOUNTER — Ambulatory Visit (HOSPITAL_BASED_OUTPATIENT_CLINIC_OR_DEPARTMENT_OTHER): Payer: Managed Care, Other (non HMO) | Admitting: Orthopaedic Surgery

## 2022-07-31 ENCOUNTER — Ambulatory Visit (INDEPENDENT_AMBULATORY_CARE_PROVIDER_SITE_OTHER): Payer: BC Managed Care – PPO | Admitting: Orthopaedic Surgery

## 2022-07-31 DIAGNOSIS — S46892A Other injury of other muscles, fascia and tendons at shoulder and upper arm level, left arm, initial encounter: Secondary | ICD-10-CM | POA: Diagnosis not present

## 2022-07-31 NOTE — Progress Notes (Signed)
Post Operative Evaluation    Procedure/Date of Surgery: Left shoulder removal of loose body and subscapularis repair 03/11/22  Interval History:    Today 4.5 status post the above procedure overall doing well.  He is continuing to make strengthening improvements.  His range of motion is normalized.  He is continue to progress and is now doing most normal activities such as throwing a lacrosse ball 50 times overhead. PMH/PSH/Family History/Social History/Meds/Allergies:    No past medical history on file.  Past Surgical History:  Procedure Laterality Date   CLAVICLE SURGERY  2011   broke collar bone skiing   LIPOMA EXCISION  2007   benign lipoma in colon, caused intersepcion   SHOULDER ARTHROSCOPY WITH DEBRIDEMENT AND BICEP TENDON REPAIR Left 03/11/2022   Procedure: LEFT SHOULDER ARTHROSCOPY WITH MINI OPEN SUBSCAPULARIS REPAIR  AND BICEP TENDODESIS;  Surgeon: Vanetta Mulders, MD;  Location: [facility];  Service: Orthopedics;  Laterality: Left;   WOUND EXPLORATION N/A 05/23/2022   Procedure: WOUND EXPLORATION WITH REMOVAL OF FOREIGN BODY OF ABDOMINAL WALL;  Surgeon: Armandina Gemma, MD;  Location: [facility];  Service: General;  Laterality: N/A;   Social History   Socioeconomic History   Marital status: Married    Spouse name: Apolonio Schneiders   Number of children: Not on file   Years of education: Not on file   Highest education level: Master's degree (e.g., MA, MS, MEng, MEd, MSW, MBA)  Occupational History   Not on file  Tobacco Use   Smoking status: Never   Smokeless tobacco: Never  Vaping Use   Vaping Use: Not on file  Substance and Sexual Activity   Alcohol use: Yes    Comment: occas   Drug use: Never   Sexual activity: Yes    Partners: Female  Other Topics Concern   Not on file  Social History Narrative   Not on file   Social Determinants of Health   Financial Resource Strain: Low Risk  (12/28/2021)   Overall Financial Resource  Strain (CARDIA)    Difficulty of Paying Living Expenses: Not hard at all  Food Insecurity: No Food Insecurity (12/28/2021)   Hunger Vital Sign    Worried About Running Out of Food in the Last Year: Never true    [place] in the Last Year: Never true  Transportation Needs: No Transportation Needs (12/28/2021)   PRAPARE - Hydrologist (Medical): No    Lack of Transportation (Non-Medical): No  Physical Activity: Insufficiently Active (12/28/2021)   Exercise Vital Sign    Days of Exercise per Week: 4 days    Minutes of Exercise per Session: 30 min  Stress: No Stress Concern Present (12/28/2021)   [place]    Feeling of Stress : Only a little  Social Connections: Unknown (12/28/2021)   Social Connection and Isolation Panel [NHANES]    Frequency of Communication with Friends and Family: Twice a week    Frequency of Social Gatherings with Friends and Family: Once a week    Attends Religious Services: Patient refused    Marine scientist or Organizations: Yes    Attends Music therapist: More than 4 times per year    Marital Status: Married   Family History  Problem Relation Age of Onset   Anxiety disorder Mother    Cancer Mother        melanoma   Miscarriages / [place] Mother    Asthma Brother    Arthritis Maternal Grandmother    Cancer Maternal Grandfather    Heart attack Maternal Grandfather    No Known Allergies Current Outpatient Medications  Medication Sig Dispense Refill   ascorbic acid (VITAMIN C) 500 MG tablet Take 500 mg by mouth daily.     Cholecalciferol (VITAMIN D3) 125 MCG (5000 UT) TABS Take 5,000 Units by mouth daily.     cyanocobalamin (VITAMIN B12) 1000 MCG tablet Take 1,000 mcg by mouth daily.     escitalopram (LEXAPRO) 10 MG tablet TAKE 1 TABLET(10 MG) BY MOUTH DAILY 90 tablet 2   fexofenadine (ALLEGRA) 180 MG tablet Take 180 mg by mouth daily  as needed for allergies.     traMADol (ULTRAM) 50 MG tablet Take 1-2 tablets (50-100 mg total) by mouth every 6 (six) hours as needed. 15 tablet 0   No current facility-administered medications for this visit.   No results found.  Review of Systems:   A ROS was performed including pertinent positives and negatives as documented in the HPI.   Musculoskeletal Exam:    There were no vitals taken for this visit.  Left shoulder portals are healed.  Able to flex and extend at the left elbow.  Active forward elevation of the left shoulder is 170 degrees equal to the contralateral side, external rotation actively is to 70 degrees equal to the contralateral side.  Internal rotation is to T12 bilaterally.  Distal neurosensory exam is intact.  There is some weakness on the left compared to the contralateral side with forward elevation  Imaging:      I personally reviewed and interpreted the radiographs.   Assessment:   4.5 months status post left shoulder removal of loose body, subscapularis repair, biceps tendon repair overall doing well.  His pain is completely resolved and his scapular rhythm is improved.  He is working on endurance which is going quite well.  At this time he is quite happy with how he is doing.  I will plan to see him back as needed Plan :    -Return to clinic as needed     I personally saw and evaluated the patient, and participated in the management and treatment plan.  Vanetta Mulders, MD Attending Physician, Orthopedic Surgery  This document was dictated using Dragon voice recognition software. A reasonable attempt at proof reading has been made to minimize errors.

## 2022-08-05 ENCOUNTER — Encounter (HOSPITAL_BASED_OUTPATIENT_CLINIC_OR_DEPARTMENT_OTHER): Payer: Self-pay | Admitting: Physical Therapy

## 2022-09-24 ENCOUNTER — Encounter (HOSPITAL_BASED_OUTPATIENT_CLINIC_OR_DEPARTMENT_OTHER): Payer: Self-pay | Admitting: Physical Therapy

## 2022-12-22 ENCOUNTER — Encounter (HOSPITAL_COMMUNITY): Payer: Self-pay | Admitting: Emergency Medicine

## 2022-12-22 ENCOUNTER — Ambulatory Visit (HOSPITAL_COMMUNITY)
Admission: EM | Admit: 2022-12-22 | Discharge: 2022-12-22 | Disposition: A | Discharge status: Home / Self Care | Payer: BC Managed Care – PPO | Attending: Internal Medicine | Admitting: Internal Medicine

## 2022-12-22 ENCOUNTER — Other Ambulatory Visit: Payer: Self-pay

## 2022-12-22 ENCOUNTER — Encounter: Payer: Self-pay | Admitting: Family Medicine

## 2022-12-22 DIAGNOSIS — J029 Acute pharyngitis, unspecified: Secondary | ICD-10-CM | POA: Diagnosis not present

## 2022-12-22 DIAGNOSIS — F419 Anxiety disorder, unspecified: Secondary | ICD-10-CM

## 2022-12-22 LAB — POCT RAPID STREP A (OFFICE): Rapid Strep A Screen: NEGATIVE — NL

## 2022-12-22 MED ORDER — ESCITALOPRAM OXALATE 10 MG PO TABS: ORAL_TABLET | ORAL | 0 refills | Status: DC

## 2022-12-22 MED ORDER — AMOXICILLIN-POT CLAVULANATE 875-125 MG PO TABS: 1.0000 | ORAL_TABLET | Freq: Two times a day (BID) | ORAL | 0 refills | Status: AC

## 2022-12-22 NOTE — ED Triage Notes (Signed)
Started feeling bad Friday night/Saturday morning.  Complains of chills, body aches over the weekend.  Last night developed a sore throat.    Patient has been taking advil.

## 2022-12-22 NOTE — Discharge Instructions (Addendum)
Your strep test was negative. A throat culture was sent to the lab for further testing.  You will be called with the results of your culture. Recommend salt water gargling and over-the-counter Ibuprofen/Tylenol as needed for pain if you are not allergic.   Return in 2 to 3 days if no improvement. Please go directly to the Emergency Department immediately should you begin to have any of the following symptoms: increased pain, persistent fevers, difficulty swallowing, difficulty talking, drooling or difficulty breathing.

## 2022-12-22 NOTE — ED Provider Notes (Signed)
[facility]   Note:  This document was prepared using Dragon voice recognition software and may include unintentional dictation errors.  MRN: [id number] DOB: 12/30/1985 DATE: 12/22/22   Subjective:  Chief Complaint:  Chief Complaint  Patient presents with   Generalized Body Aches    HPI: Kent Johnson is a 37 y.o. male presenting for sore throat and myalgias for 4 days.  Patient states he first noticed myalgias and chills starting Friday night.  He has since developed a sore throat that is worse with swallowing, eating, and talking.  No one else with any similar symptoms at home.  He has tried Advil with relief.  He became concerned today when he noticed exudates. Denies fever, nausea/vomiting, abdominal pain, cough, congestion. Endorses sore throat, myalgias, chills. Presents NAD.  Prior to Admission medications   Medication Sig Start Date End Date Taking? Authorizing Provider  ascorbic acid (VITAMIN C) 500 MG tablet Take 500 mg by mouth daily.    [provider]  Cholecalciferol (VITAMIN D3) 125 MCG (5000 UT) TABS Take 5,000 Units by mouth daily.    [provider]  cyanocobalamin (VITAMIN B12) 1000 MCG tablet Take 1,000 mcg by mouth daily.    [provider]  escitalopram (LEXAPRO) 10 MG tablet TAKE 1 TABLET(10 MG) BY MOUTH DAILY 12/22/22   [name], [name] G, MD  fexofenadine (ALLEGRA) 180 MG tablet Take 180 mg by mouth daily as needed for allergies.    [provider]     No Known Allergies  History:   History reviewed. No pertinent past medical history.   Past Surgical History:  Procedure Laterality Date   CLAVICLE SURGERY  2011   broke collar bone skiing   LIPOMA EXCISION  2007   benign lipoma in colon, caused intersepcion   SHOULDER ARTHROSCOPY WITH DEBRIDEMENT AND BICEP TENDON REPAIR Left 03/11/2022   Procedure: LEFT SHOULDER ARTHROSCOPY WITH MINI OPEN SUBSCAPULARIS REPAIR  AND BICEP TENDODESIS;  Surgeon: Huel Cote, MD;  Location: [facility];  Service: Orthopedics;  Laterality: Left;   WOUND EXPLORATION N/A 05/23/2022   Procedure: WOUND EXPLORATION WITH REMOVAL OF FOREIGN BODY OF ABDOMINAL WALL;  Surgeon: Darnell Level, MD;  Location: [facility];  Service: General;  Laterality: N/A;    Family History  Problem Relation Age of Onset   Anxiety disorder Mother    Cancer Mother        melanoma   Miscarriages / Stillbirths Mother    Asthma Brother    Arthritis Maternal Grandmother    Cancer Maternal Grandfather    Heart attack Maternal Grandfather     Social History   Tobacco Use   Smoking status: Some Days    Types: Cigarettes   Smokeless tobacco: Never  Substance Use Topics   Alcohol use: Yes    Comment: occas   Drug use: Never    Review of Systems  Constitutional:  Positive for chills and fatigue. Negative for fever.  HENT:  Positive for sore throat. Negative for congestion, ear pain and rhinorrhea.   Respiratory:  Negative for cough.   Gastrointestinal:  Negative for abdominal pain, nausea and vomiting.  Musculoskeletal:  Positive for myalgias.     Objective:   Vitals: BP 121/77 (BP Location: Right Arm)   Pulse 60   Temp 98.2 F (36.8 C) (Oral)   Resp 18   SpO2 97%   Physical Exam Constitutional:      General: He is not in acute distress.    Appearance:  Normal appearance. He is well-developed and normal weight. He is not ill-appearing or toxic-appearing.  HENT:     Head: Normocephalic and atraumatic.     Mouth/Throat:     Pharynx: Uvula midline. Pharyngeal swelling, oropharyngeal exudate and posterior oropharyngeal erythema present.     Tonsils: Tonsillar exudate present. No tonsillar abscesses.  Cardiovascular:     Rate and Rhythm: Normal rate and regular rhythm.     Heart sounds: Normal heart sounds.  Pulmonary:     Effort: Pulmonary effort is normal.     Breath sounds: Normal breath sounds.     Comments: Clear to auscultation  bilaterally  Abdominal:     General: Bowel sounds are normal.     Palpations: Abdomen is soft.     Tenderness: There is no abdominal tenderness.  Lymphadenopathy:     Cervical: No cervical adenopathy.     Right cervical: No superficial cervical adenopathy.    Left cervical: No superficial cervical adenopathy.  Skin:    General: Skin is warm and dry.  Neurological:     General: No focal deficit present.     Mental Status: He is alert.  Psychiatric:        Mood and Affect: Mood and affect normal.     Results:  Labs: Results for orders placed or performed during the hospital encounter of 12/22/22 (from the past 24 hour(s))  POC rapid strep A     Status: Normal   Collection Time: 12/22/22  4:42 PM  Result Value Ref Range   Rapid Strep A Screen Negative Negative    Radiology: No results found.   UC Course/Treatments:  Procedures: Procedures   Medications Ordered in UC: Medications - No data to display   Assessment and Plan :     ICD-10-CM   1. Acute pharyngitis, unspecified etiology  J02.9      Acute pharyngitis, unspecified etiology Afebrile, nontoxic-appearing, NAD. VSS. DDX includes but not limited to: Strep, mono, viral pharyngitis, postnasal drip Strep was negative today in office. Throat culture is pending. Given exudates and worsening of symptoms, Augmentin 875mg  BID was prescribed. ABX can be stopped if culture is negative. Strict ED precautions were given and patient verbalized understanding.  ED Discharge Orders          Ordered    amoxicillin-clavulanate (AUGMENTIN) 875-125 MG tablet  Every 12 hours        12/22/22 1656             PDMP not reviewed this encounter.     Cynda Acres, PA-C 12/22/22 1657

## 2022-12-24 ENCOUNTER — Ambulatory Visit: Payer: BC Managed Care – PPO | Admitting: Family Medicine

## 2023-03-30 ENCOUNTER — Encounter (HOSPITAL_COMMUNITY): Payer: Self-pay

## 2023-03-30 ENCOUNTER — Ambulatory Visit (HOSPITAL_COMMUNITY)
Admission: EM | Admit: 2023-03-30 | Discharge: 2023-03-30 | Disposition: A | Discharge status: Home / Self Care | Payer: BC Managed Care – PPO | Attending: Internal Medicine | Admitting: Internal Medicine

## 2023-03-30 DIAGNOSIS — S0101XA Laceration without foreign body of scalp, initial encounter: Secondary | ICD-10-CM | POA: Diagnosis not present

## 2023-03-30 NOTE — Discharge Instructions (Signed)
Daily wound dressing changes with topical antibiotic ointment Please cover the wound with a waterproof wound dressing whilst washing of the blood and the rest of your hair Please avoid wound from coming into contact with soap for the next 48 hours After 48 hours it is okay for the wound to come into contact with mild soap and water.  Please make sure you wash all the soap off. If you observe redness, discharge or worsening pain please return to the urgent care to be reevaluated Please return to the urgent care in 7 to 10 days for staples to be removed.

## 2023-03-30 NOTE — ED Triage Notes (Signed)
Pt presents with complaints of laceration. Pt was cleaning out his gutters where a squirrel had gotten into. The gutter fell 8 feet and landed on his head. Pt has 2 inch laceration on the top of his head. Bleeding controlled. Denies any loc or being on blood thinners.

## 2023-03-30 NOTE — ED Provider Notes (Signed)
[facility]    CSN: [id number] Arrival date & time: 03/30/23  1414      History   Chief Complaint Chief Complaint  Patient presents with   Laceration    HPI Kent Johnson is a 37 y.o. male comes to the urgent care with laceration over the scalp.  Patient sustained the injury when her daughter fell and landed on his head.  Got a fell from a height of about 8 feet.  He sustained a laceration to the scalp.  Bleeding was controlled at the time of coming to the urgent care.  Patient denies any use of blood thinners.  Patient did not experience any loss of consciousness.  He denies any dizziness, blurry vision or double vision at this time.  No no nausea or vomiting.  Last Tdap was less than 5 years ago.   HPI  History reviewed. No pertinent past medical history.  Patient Active Problem List   Diagnosis Date Noted   Foreign body in anterior abdominal wall 05/18/2022   Avulsion of left subscapularis    Anxiety disorder 11/11/2021   Insomnia 11/11/2021    Past Surgical History:  Procedure Laterality Date   CLAVICLE SURGERY  2011   broke collar bone skiing   LIPOMA EXCISION  2007   benign lipoma in colon, caused intersepcion   SHOULDER ARTHROSCOPY WITH DEBRIDEMENT AND BICEP TENDON REPAIR Left 03/11/2022   Procedure: LEFT SHOULDER ARTHROSCOPY WITH MINI OPEN SUBSCAPULARIS REPAIR  AND BICEP TENDODESIS;  Surgeon: Huel Cote, MD;  Location: [facility];  Service: Orthopedics;  Laterality: Left;   WOUND EXPLORATION N/A 05/23/2022   Procedure: WOUND EXPLORATION WITH REMOVAL OF FOREIGN BODY OF ABDOMINAL WALL;  Surgeon: Darnell Level, MD;  Location: [facility];  Service: General;  Laterality: N/A;       Home Medications    Prior to Admission medications   Medication Sig Start Date End Date Taking? Authorizing Provider  ascorbic acid (VITAMIN C) 500 MG tablet Take 500 mg by mouth daily.    [provider]  Cholecalciferol (VITAMIN D3) 125  MCG (5000 UT) TABS Take 5,000 Units by mouth daily.    [provider]  cyanocobalamin (VITAMIN B12) 1000 MCG tablet Take 1,000 mcg by mouth daily.    [provider]  escitalopram (LEXAPRO) 10 MG tablet TAKE 1 TABLET(10 MG) BY MOUTH DAILY 12/22/22   [name], [name] G, MD  fexofenadine (ALLEGRA) 180 MG tablet Take 180 mg by mouth daily as needed for allergies.    [provider]    Family History Family History  Problem Relation Age of Onset   Anxiety disorder Mother    Cancer Mother        melanoma   Miscarriages / [place] Mother    Asthma Brother    Arthritis Maternal Grandmother    Cancer Maternal Grandfather    Heart attack Maternal Grandfather     Social History Social History   Tobacco Use   Smoking status: Some Days    Types: Cigarettes   Smokeless tobacco: Never  Substance Use Topics   Alcohol use: Yes    Comment: occas   Drug use: Never     Allergies   Patient has no known allergies.   Review of Systems Review of Systems As per HPI  Physical Exam Triage Vital Signs ED Triage Vitals  Encounter Vitals Group     BP 03/30/23 1422 130/84     Systolic BP Percentile --  Diastolic BP Percentile --      Pulse Rate 03/30/23 1422 60     Resp 03/30/23 1422 18     Temp 03/30/23 1422 98.3 F (36.8 C)     Temp src --      SpO2 03/30/23 1422 98 %     Weight --      Height --      Head Circumference --      Peak Flow --      Pain Score 03/30/23 1421 7     Pain Loc --      Pain Education --      Exclude from Growth Chart --    No data found.  Updated Vital Signs BP 130/84   Pulse 60   Temp 98.3 F (36.8 C)   Resp 18   SpO2 98%   Visual Acuity Right Eye Distance:   Left Eye Distance:   Bilateral Distance:    Right Eye Near:   Left Eye Near:    Bilateral Near:     Physical Exam Constitutional:      General: He is in acute distress.     Appearance: Normal appearance. He is not ill-appearing.  Cardiovascular:      Rate and Rhythm: Normal rate and regular rhythm.  Pulmonary:     Effort: Pulmonary effort is normal.     Breath sounds: Normal breath sounds.  Musculoskeletal:        General: Normal range of motion.     Comments: Neck is supple.  Skin:    Comments: Laceration over the parietal scalp.  Laceration is about 2 inches in length.  Bleeding is controlled.  Neurological:     General: No focal deficit present.     Mental Status: He is alert and oriented to person, place, and time.     Cranial Nerves: No cranial nerve deficit.     Sensory: No sensory deficit.  Psychiatric:        Mood and Affect: Mood normal.        Behavior: Behavior normal.      UC Treatments / Results  Labs (all labs ordered are listed, but only abnormal results are displayed) Labs Reviewed - No data to display  EKG   Radiology No results found.  Procedures Laceration Repair  Date/Time: 03/30/2023 6:32 PM  Performed by: Merrilee Jansky, MD Authorized by: Merrilee Jansky, MD   Consent:    Consent obtained:  Verbal   Consent given by:  Patient   Risks discussed:  Infection   Alternatives discussed:  No treatment and delayed treatment Universal protocol:    Patient identity confirmed:  Verbally with patient Anesthesia:    Anesthesia method:  Local infiltration   Local anesthetic:  Lidocaine 1% WITH epi Laceration details:    Location:  Scalp   Scalp location:  Mid-scalp   Length (cm):  5   Depth (mm):  5 Pre-procedure details:    Preparation:  Patient was prepped and draped in usual sterile fashion Exploration:    Limited defect created (wound extended): no     Wound exploration: wound explored through full range of motion     Wound extent: no fascia violation noted, no foreign bodies/material noted and no muscle damage noted     Contaminated: no   Treatment:    Area cleansed with:  Shur-Clens   Amount of cleaning:  Standard   Visualized foreign bodies/material removed: no     Debridement:   None  Skin repair:    Repair method:  Staples   Number of staples:  6 Approximation:    Approximation:  Close Repair type:    Repair type:  Simple Post-procedure details:    Procedure completion:  Tolerated well, no immediate complications  (including critical care time)  Medications Ordered in UC Medications - No data to display  Initial Impression / Assessment and Plan / UC Course  I have reviewed the triage vital signs and the nursing notes.  Pertinent labs & imaging results that were available during my care of the patient were reviewed by me and considered in my medical decision making (see chart for details).     1.  Scalp laceration: Laceration was repaired with staples No signs of concussion.  Patient has no neurologic deficits.  No gait abnormalities. Laceration repair instructions given Patient is advised to return to the urgent care if he notices any signs of infection Staples will be removed in 7 to 10 days Final Clinical Impressions(s) / UC Diagnoses   Final diagnoses:  Laceration of scalp, initial encounter     Discharge Instructions      Daily wound dressing changes with topical antibiotic ointment Please cover the wound with a waterproof wound dressing whilst washing of the blood and the rest of your hair Please avoid wound from coming into contact with soap for the next 48 hours After 48 hours it is okay for the wound to come into contact with mild soap and water.  Please make sure you wash all the soap off. If you observe redness, discharge or worsening pain please return to the urgent care to be reevaluated Please return to the urgent care in 7 to 10 days for staples to be removed.   ED Prescriptions   None    PDMP not reviewed this encounter.   Merrilee Jansky, MD 03/30/23 [phone]

## 2023-04-02 ENCOUNTER — Other Ambulatory Visit: Payer: Self-pay | Admitting: Family Medicine

## 2023-04-02 DIAGNOSIS — F419 Anxiety disorder, unspecified: Secondary | ICD-10-CM

## 2023-04-06 ENCOUNTER — Telehealth: Payer: Self-pay | Admitting: Orthopaedic Surgery

## 2023-04-06 ENCOUNTER — Ambulatory Visit (HOSPITAL_COMMUNITY)
Admission: RE | Admit: 2023-04-06 | Discharge: 2023-04-06 | Disposition: A | Discharge status: Home / Self Care | Payer: BC Managed Care – PPO | Source: Ambulatory Visit | Attending: Family Medicine | Admitting: Family Medicine

## 2023-04-06 NOTE — Telephone Encounter (Signed)
Patient called and wants to know what to do first, he has recurring muscle issues with his left shoulder that bokshan did his surgery a year ago. [phone]

## 2023-04-06 NOTE — Telephone Encounter (Signed)
Just FYI Pt stated for a couple months he has been having setbacks where he is in pain for one week after yoga or gym over anterior shoulder. I scheduled him to come in this Friday for follow up with you to reassess.

## 2023-04-06 NOTE — ED Triage Notes (Signed)
Suture removal.  Notes indicate 6 staples.  Patient denies concerns

## 2023-04-07 NOTE — Progress Notes (Incomplete)
HPI: [name] is a 37 y.o.male with PMHx significant for anxiety here today for his routine physical examination.  Last CPE: 11/11/2021  Exercise: He has been running, biking, or playing pickleball approximately 4x/wk Diet: He is cooking at home and eating vegetables daily, and eating a mix of chicken, fish, and red meat Smoking: He smokes a cigar about 2x/year "on special occasions" Alcohol: He drinks approximately 2 cocktails/week Sleeps about 6hrs/night Vision: UTD on routine vision care, about 1x/year Dental: UTD on routine dental care  Immunization History  Administered Date(s) Administered   Influenza,inj,Quad PF,6+ Mos 04/06/2019   Tdap 07/22/2015   Health Maintenance  Topic Date Due   COVID-19 Vaccine (1 - 2023-24 season) 04/24/2023 (Originally 03/22/2023)   INFLUENZA VACCINE  10/19/2023 (Originally 02/19/2023)   HIV Screening  06/07/2024 (Originally 08/26/2001)   DTaP/Tdap/Td (2 - Td or Tdap) 07/21/2025   Hepatitis C Screening  Completed   HPV VACCINES  Aged Out   Lab Results  Component Value Date   CHOL 172 11/11/2021   HDL 56.40 11/11/2021   LDLCALC 100 (H) 11/11/2021   TRIG 79.0 11/11/2021   CHOLHDL 3 11/11/2021   Lab Results  Component Value Date   NA 140 11/11/2021   CL 104 11/11/2021   K 4.1 11/11/2021   CO2 29 11/11/2021   BUN 15 11/11/2021   CREATININE 1.16 11/11/2021   GFR 81.77 11/11/2021   CALCIUM 9.5 11/11/2021   GLUCOSE 92 11/11/2021   Anxiety:He has been taking Lexapro 10 mg for about a year. He states it has significant helped with symptoms.  Recently evaluated in the ED for scalp laceration, 03/30/23. Area is healing well, no pain reported. Negative for headaches.  Review of Systems  Constitutional:  Negative for activity change, appetite change and fever.  HENT:  Negative for nosebleeds, sore throat and trouble swallowing.   Eyes:  Negative for redness and visual disturbance.  Respiratory:  Negative for cough,  shortness of breath and wheezing.   Cardiovascular:  Negative for chest pain, palpitations and leg swelling.  Gastrointestinal:  Negative for abdominal pain, blood in stool, nausea and vomiting.  Endocrine: Negative for cold intolerance, heat intolerance, polydipsia, polyphagia and polyuria.  Genitourinary:  Negative for decreased urine volume, dysuria, genital sores, hematuria and testicular pain.  Musculoskeletal:  Negative for gait problem, myalgias and neck pain.  Skin:  Negative for color change and rash.  Allergic/Immunologic: Positive for environmental allergies.  Neurological:  Negative for dizziness, syncope, weakness and headaches.  Hematological:  Negative for adenopathy. Does not bruise/bleed easily.  Psychiatric/Behavioral:  Negative for confusion and hallucinations.    Current Outpatient Medications on File Prior to Visit  Medication Sig Dispense Refill   ascorbic acid (VITAMIN C) 500 MG tablet Take 500 mg by mouth daily.     Cholecalciferol (VITAMIN D3) 125 MCG (5000 UT) TABS Take 5,000 Units by mouth daily.     cyanocobalamin (VITAMIN B12) 1000 MCG tablet Take 1,000 mcg by mouth daily.     fexofenadine (ALLEGRA) 180 MG tablet Take 180 mg by mouth daily as needed for allergies.     No current facility-administered medications on file prior to visit.   Past Medical History:  Diagnosis Date   [place]   [place]th[place]   rt knee   Past Surgical History:  Procedure Laterality Date   CLAVICLE SURGERY  2011   broke collar bone skiing   COLON SURGERY  around 2007   benign  lipoma inside of colon lead to intusseption   LIPOMA EXCISION  2007   benign lipoma in colon, caused intersepcion   SHOULDER ARTHROSCOPY WITH DEBRIDEMENT AND BICEP TENDON REPAIR Left 03/11/2022   Procedure: LEFT SHOULDER ARTHROSCOPY WITH MINI OPEN SUBSCAPULARIS REPAIR  AND BICEP TENDODESIS;  Surgeon: Huel Cote, MD;  Location: [facility];  Service: Orthopedics;   Laterality: Left;   WOUND EXPLORATION N/A 05/23/2022   Procedure: WOUND EXPLORATION WITH REMOVAL OF FOREIGN BODY OF ABDOMINAL WALL;  Surgeon: Darnell Level, MD;  Location: [facility];  Service: General;  Laterality: N/A;   No Known Allergies  Family History  Problem Relation Age of Onset   Anxiety disorder Mother    Cancer Mother        melanoma   Miscarriages / Stillbirths Mother    Asthma Brother    Arthritis Maternal Grandmother    Cancer Maternal Grandfather    Heart attack Maternal Grandfather    Social History   Socioeconomic History   Marital status: Married    Spouse name: Fleet Contras   Number of children: Not on file   Years of education: Not on file   Highest education level: Master's degree (e.g., MA, MS, MEng, MEd, MSW, MBA)  Occupational History   Not on file  Tobacco Use   Smoking status: Some Days    Types: Cigarettes   Smokeless tobacco: Never   Tobacco comments:    2 cig per year.  Vaping Use   Vaping status: Not on file  Substance and Sexual Activity   Alcohol use: Yes    Comment: occas   Drug use: Never   Sexual activity: Yes    Partners: Female  Other Topics Concern   Not on file  Social History Narrative   Not on file   Social Determinants of Health   Financial Resource Strain: Low Risk  (04/04/2023)   Overall Financial Resource Strain (CARDIA)    Difficulty of Paying Living Expenses: Not hard at all  Food Insecurity: No Food Insecurity (04/04/2023)   Hunger Vital Sign    Worried About Running Out of Food in the Last Year: Never true    Ran Out of Food in the Last Year: Never true  Transportation Needs: No Transportation Needs (04/04/2023)   PRAPARE - Administrator, Civil Service (Medical): No    Lack of Transportation (Non-Medical): No  Physical Activity: Sufficiently Active (04/04/2023)   Exercise Vital Sign    Days of Exercise per Week: 5 days    Minutes of Exercise per Session: 30 min  Stress: No Stress Concern Present (04/04/2023)    [employer] of Occupational Health - Occupational Stress Questionnaire    Feeling of Stress : Only a little  Social Connections: Moderately Integrated (04/04/2023)   Social Connection and Isolation Panel [NHANES]    Frequency of Communication with Friends and Family: Once a week    Frequency of Social Gatherings with Friends and Family: Once a week    Attends Religious Services: 1 to 4 times per year    Active Member of [employer] or Organizations: Yes    Attends Banker Meetings: More than 4 times per year    Marital Status: Married   Vitals:   04/08/23 0837  BP: 100/70  Pulse: 62  Resp: 12  Temp: 98.5 F (36.9 C)  SpO2: 98%   Body mass index is 25.16 kg/m.  Wt Readings from Last 3 Encounters:  04/08/23 180 lb 6 oz (81.8  kg)  05/23/22 181 lb (82.1 kg)  05/20/22 181 lb (82.1 kg)   Physical Exam Vitals and nursing note reviewed.  Constitutional:      General: He is not in acute distress.    Appearance: He is well-developed.  HENT:     Head: Normocephalic and atraumatic.     Right Ear: Tympanic membrane, ear canal and external ear normal.     Left Ear: Tympanic membrane, ear canal and external ear normal.  Eyes:     Extraocular Movements: Extraocular movements intact.     Conjunctiva/sclera: Conjunctivae normal.     Pupils: Pupils are equal, round, and reactive to light.  Neck:     Thyroid: No thyromegaly.     Trachea: No tracheal deviation.  Cardiovascular:     Rate and Rhythm: Normal rate and regular rhythm.     Pulses:          Dorsalis pedis pulses are 2+ on the right side and 2+ on the left side.     Heart sounds: No murmur heard. Pulmonary:     Effort: Pulmonary effort is normal. No respiratory distress.     Breath sounds: Normal breath sounds.  Abdominal:     Palpations: Abdomen is soft. There is no hepatomegaly or mass.     Tenderness: There is no abdominal tenderness.  Genitourinary:    Comments: No concerns. Musculoskeletal:         General: No tenderness.     Cervical back: Normal range of motion.     Comments: No major deformities appreciated and no signs of synovitis.  Lymphadenopathy:     Cervical: No cervical adenopathy.     Upper Body:     Right upper body: No supraclavicular adenopathy.     Left upper body: No supraclavicular adenopathy.  Skin:    General: Skin is warm.     Findings: No erythema.  Neurological:     General: No focal deficit present.     Mental Status: He is alert and oriented to person, place, and time.     Cranial Nerves: No cranial nerve deficit.     Sensory: No sensory deficit.     Gait: Gait normal.     Deep Tendon Reflexes:     Reflex Scores:      Bicep reflexes are 2+ on the right side and 2+ on the left side.      Patellar reflexes are 2+ on the right side and 2+ on the left side. Psychiatric:        Mood and Affect: Mood and affect normal.   ASSESSMENT AND PLAN:  [name] was seen today for annual physical exam.   Routine general medical examination at a health care facility Assessment & Plan: We discussed the importance of regular physical activity and healthy diet for prevention of chronic illness and/or complications. Preventive guidelines reviewed. Vaccination up to date. Next CPE in a year.   Anxiety disorder, unspecified type Assessment & Plan: Problem is well controlled. Continue Lexapro 10 mg daily. Follow up in a year, before if needed.  Orders: -     Escitalopram Oxalate; TAKE 1 TABLET(10 MG) BY MOUTH DAILY  Dispense: 90 tablet; Refill: 2   Return in 1 year (on 04/07/2024) for CPE.  I,Rachel Rivera,acting as a scribe for [name] [name], MD.,have documented all relevant documentation on the behalf of [name] [name], MD,as directed by  [name] [name], MD while in the presence of [name] [name], MD.  I, Isabelle Course, have  reviewed all documentation for this visit. The documentation on 04/08/23 for the exam, diagnosis, procedures, and orders are all accurate  and complete.  [name] G. [name], MD  [facility]. [place] office.

## 2023-04-08 ENCOUNTER — Ambulatory Visit (INDEPENDENT_AMBULATORY_CARE_PROVIDER_SITE_OTHER): Payer: BC Managed Care – PPO | Admitting: Family Medicine

## 2023-04-08 ENCOUNTER — Encounter: Payer: Self-pay | Admitting: Family Medicine

## 2023-04-08 VITALS — BP 100/70 | HR 62 | Temp 98.5°F | Resp 12 | Ht 71.0 in | Wt 180.4 lb

## 2023-04-08 DIAGNOSIS — F419 Anxiety disorder, unspecified: Secondary | ICD-10-CM | POA: Diagnosis not present

## 2023-04-08 DIAGNOSIS — Z0000 Encounter for general adult medical examination without abnormal findings: Secondary | ICD-10-CM | POA: Diagnosis not present

## 2023-04-08 DIAGNOSIS — Z13220 Encounter for screening for lipoid disorders: Secondary | ICD-10-CM

## 2023-04-08 DIAGNOSIS — Z130 Encounter for screening for diseases of the blood and blood-forming organs and certain disorders involving the immune mechanism: Secondary | ICD-10-CM

## 2023-04-08 HISTORY — DX: Encounter for general adult medical examination without abnormal findings: Z00.00

## 2023-04-08 MED ORDER — ESCITALOPRAM OXALATE 10 MG PO TABS: ORAL_TABLET | ORAL | 2 refills | Status: DC

## 2023-04-08 NOTE — Patient Instructions (Addendum)
A few things to remember from today's visit:  Routine general medical examination at a health care facility  Screening for lipoid disorders  Screening for endocrine, metabolic and immunity disorder  Anxiety disorder, unspecified type - Plan: escitalopram (LEXAPRO) 10 MG tablet  If you need refills for medications you take chronically, please call your pharmacy. Do not use My Chart to request refills or for acute issues that need immediate attention. If you send a my chart message, it may take a few days to be addressed, specially if I am not in the office.  Please be sure medication list is accurate. If a new problem present, please set up appointment sooner than planned today.  Health Maintenance, Male Adopting a healthy lifestyle and getting preventive care are important in promoting health and wellness. Ask your health care provider about: The right schedule for you to have regular tests and exams. Things you can do on your own to prevent diseases and keep yourself healthy. What should I know about diet, weight, and exercise? Eat a healthy diet  Eat a diet that includes plenty of vegetables, fruits, low-fat dairy products, and lean protein. Do not eat a lot of foods that are high in solid fats, added sugars, or sodium. Maintain a healthy weight Body mass index (BMI) is a measurement that can be used to identify possible weight problems. It estimates body fat based on height and weight. Your health care provider can help determine your BMI and help you achieve or maintain a healthy weight. Get regular exercise Get regular exercise. This is one of the most important things you can do for your health. Most adults should: Exercise for at least 150 minutes each week. The exercise should increase your heart rate and make you sweat (moderate-intensity exercise). Do strengthening exercises at least twice a week. This is in addition to the moderate-intensity exercise. Spend less time  sitting. Even light physical activity can be beneficial. Watch cholesterol and blood lipids Have your blood tested for lipids and cholesterol at 36 years of age, then have this test every 5 years. You may need to have your cholesterol levels checked more often if: Your lipid or cholesterol levels are high. You are older than 36 years of age. You are at high risk for heart disease. What should I know about cancer screening? Many types of cancers can be detected early and may often be prevented. Depending on your health history and family history, you may need to have cancer screening at various ages. This may include screening for: Colorectal cancer. Prostate cancer. Skin cancer. Lung cancer. What should I know about heart disease, diabetes, and high blood pressure? Blood pressure and heart disease High blood pressure causes heart disease and increases the risk of stroke. This is more likely to develop in people who have high blood pressure readings or are overweight. Talk with your health care provider about your target blood pressure readings. Have your blood pressure checked: Every 3-5 years if you are 82-48 years of age. Every year if you are 56 years old or older. If you are between the ages of 6 and 71 and are a current or former smoker, ask your health care provider if you should have a one-time screening for abdominal aortic aneurysm (AAA). Diabetes Have regular diabetes screenings. This checks your fasting blood sugar level. Have the screening done: Once every three years after age 47 if you are at a normal weight and have a low risk for diabetes. More often  and at a younger age if you are overweight or have a high risk for diabetes. What should I know about preventing infection? Hepatitis B If you have a higher risk for hepatitis B, you should be screened for this virus. Talk with your health care provider to find out if you are at risk for hepatitis B infection. Hepatitis  C Blood testing is recommended for: Everyone born from [birth year] through 1964. Anyone with known risk factors for hepatitis C. Sexually transmitted infections (STIs) You should be screened each year for STIs, including gonorrhea and chlamydia, if: You are sexually active and are younger than 36 years of age. You are older than 36 years of age and your health care provider tells you that you are at risk for this type of infection. Your sexual activity has changed since you were last screened, and you are at increased risk for chlamydia or gonorrhea. Ask your health care provider if you are at risk. Ask your health care provider about whether you are at high risk for HIV. Your health care provider may recommend a prescription medicine to help prevent HIV infection. If you choose to take medicine to prevent HIV, you should first get tested for HIV. You should then be tested every 3 months for as long as you are taking the medicine. Follow these instructions at home: Alcohol use Do not drink alcohol if your health care provider tells you not to drink. If you drink alcohol: Limit how much you have to 0-2 drinks a day. Know how much alcohol is in your drink. In the [place] one drink equals one 12 oz bottle of beer (355 mL), one 5 oz glass of wine (148 mL), or one 1 oz glass of hard liquor (44 mL). Lifestyle Do not use any products that contain nicotine or tobacco. These products include cigarettes, chewing tobacco, and vaping devices, such as e-cigarettes. If you need help quitting, ask your health care provider. Do not use street drugs. Do not share needles. Ask your health care provider for help if you need support or information about quitting drugs. General instructions Schedule regular health, dental, and eye exams. Stay current with your vaccines. Tell your health care provider if: You often feel depressed. You have ever been abused or do not feel safe at home. Summary Adopting a healthy  lifestyle and getting preventive care are important in promoting health and wellness. Follow your health care provider's instructions about healthy diet, exercising, and getting tested or screened for diseases. Follow your health care provider's instructions on monitoring your cholesterol and blood pressure. This information is not intended to replace advice given to you by your health care provider. Make sure you discuss any questions you have with your health care provider. Document Revised: 11/26/2020 Document Reviewed: 11/26/2020 Elsevier Patient Education  2024 [employer].

## 2023-04-09 ENCOUNTER — Encounter: Payer: Self-pay | Admitting: Family Medicine

## 2023-04-09 NOTE — Assessment & Plan Note (Signed)
We discussed the importance of regular physical activity and healthy diet for prevention of chronic illness and/or complications. Preventive guidelines reviewed. Vaccination up-to-date. Next CPE in a year.

## 2023-04-09 NOTE — Assessment & Plan Note (Signed)
Problem is well controlled. Continue Lexapro 10 mg daily. Follow up in a year, before if needed.

## 2023-04-10 ENCOUNTER — Ambulatory Visit (HOSPITAL_BASED_OUTPATIENT_CLINIC_OR_DEPARTMENT_OTHER): Payer: BC Managed Care – PPO | Admitting: Orthopaedic Surgery

## 2023-04-10 DIAGNOSIS — G8929 Other chronic pain: Secondary | ICD-10-CM | POA: Diagnosis not present

## 2023-04-10 DIAGNOSIS — M25512 Pain in left shoulder: Secondary | ICD-10-CM | POA: Diagnosis not present

## 2023-04-10 NOTE — Progress Notes (Signed)
Post Operative Evaluation    Procedure/Date of Surgery: Left shoulder removal of loose body and subscapularis repair 03/11/22  Interval History:    Presents today 1 year out from the above procedure.  Overall he is doing generally well although he is still having persistent limited function when he is being very active.  He feels like the shoulder is somewhat disconnected or not fully strong.  There is some tenderness about the groove of the biceps as well.  PMH/PSH/Family History/Social History/Meds/Allergies:    Past Medical History:  Diagnosis Date   [place]   [place]   rt [place]   Routine general medical examination at a health care facility 04/08/2023    Past Surgical History:  Procedure Laterality Date   CLAVICLE SURGERY  2011   broke collar bone skiing   COLON SURGERY  around 2007   benign lipoma inside of colon lead to intusseption   LIPOMA EXCISION  2007   benign lipoma in colon, caused intersepcion   SHOULDER ARTHROSCOPY WITH DEBRIDEMENT AND BICEP TENDON REPAIR Left 03/11/2022   Procedure: LEFT SHOULDER ARTHROSCOPY WITH MINI OPEN SUBSCAPULARIS REPAIR  AND BICEP TENDODESIS;  Surgeon: Huel Cote, MD;  Location: [facility];  Service: Orthopedics;  Laterality: Left;   WOUND EXPLORATION N/A 05/23/2022   Procedure: WOUND EXPLORATION WITH REMOVAL OF FOREIGN BODY OF ABDOMINAL WALL;  Surgeon: Darnell Level, MD;  Location: [facility];  Service: General;  Laterality: N/A;   Social History   Socioeconomic History   Marital status: Married    Spouse name: Fleet Contras   Number of children: Not on file   Years of education: Not on file   Highest education level: Master's degree (e.g., MA, MS, MEng, MEd, MSW, MBA)  Occupational History   Not on file  Tobacco Use   Smoking status: Some Days    Types: Cigarettes   Smokeless tobacco: Never   Tobacco comments:    2 cig per year.  Vaping Use   Vaping status:  Not on file  Substance and Sexual Activity   Alcohol use: Yes    Comment: occas   Drug use: Never   Sexual activity: Yes    Partners: Female  Other Topics Concern   Not on file  Social History Narrative   Not on file   Social Determinants of Health   Financial Resource Strain: Low Risk  (04/04/2023)   Overall Financial Resource Strain (CARDIA)    Difficulty of Paying Living Expenses: Not hard at all  Food Insecurity: No Food Insecurity (04/04/2023)   Hunger Vital Sign    Worried About Running Out of Food in the Last Year: Never true    Ran Out of Food in the Last Year: Never true  Transportation Needs: No Transportation Needs (04/04/2023)   PRAPARE - Administrator, Civil Service (Medical): No    Lack of Transportation (Non-Medical): No  Physical Activity: Sufficiently Active (04/04/2023)   Exercise Vital Sign    Days of Exercise per Week: 5 days    Minutes of Exercise per Session: 30 min  Stress: No Stress Concern Present (04/04/2023)   [employer] of Occupational Health - Occupational Stress Questionnaire    Feeling of Stress : Only a little  Social Connections: Moderately Integrated (04/04/2023)   Social  Connection and Isolation Panel [NHANES]    Frequency of Communication with Friends and Family: Once a week    Frequency of Social Gatherings with Friends and Family: Once a week    Attends Religious Services: 1 to 4 times per year    Active Member of [employer] or Organizations: Yes    Attends Engineer, structural: More than 4 times per year    Marital Status: Married   Family History  Problem Relation Age of Onset   Anxiety disorder Mother    Cancer Mother        melanoma   Miscarriages / Stillbirths Mother    Asthma Brother    Arthritis Maternal Grandmother    Cancer Maternal Grandfather    Heart attack Maternal Grandfather    No Known Allergies Current Outpatient Medications  Medication Sig Dispense Refill   ascorbic acid (VITAMIN C) 500 MG  tablet Take 500 mg by mouth daily.     Cholecalciferol (VITAMIN D3) 125 MCG (5000 UT) TABS Take 5,000 Units by mouth daily.     cyanocobalamin (VITAMIN B12) 1000 MCG tablet Take 1,000 mcg by mouth daily.     escitalopram (LEXAPRO) 10 MG tablet TAKE 1 TABLET(10 MG) BY MOUTH DAILY 90 tablet 2   fexofenadine (ALLEGRA) 180 MG tablet Take 180 mg by mouth daily as needed for allergies.     No current facility-administered medications for this visit.   No results found.  Review of Systems:   A ROS was performed including pertinent positives and negatives as documented in the HPI.   Musculoskeletal Exam:    There were no vitals taken for this visit.  Left shoulder portals are healed.  Able to flex and extend at the left elbow.  Active forward elevation of the left shoulder is 170 degrees equal to the contralateral side, external rotation actively is to 70 degrees equal to the contralateral side.  Internal rotation is to T12 bilaterally.  Distal neurosensory exam is intact.  There is some weakness on the left compared to the contralateral side with forward elevation  Imaging:      I personally reviewed and interpreted the radiographs.   Assessment:   24-month status post left arthroscopic removal of loose body subscapularis repair with biceps tendon repair.  I did describe that overall today's symptoms are more consistent with labral pathology with some biceps groove pain.  Given the fact that he is postop and has not had a complete recovery I would like to obtain an MRI to rule out an underlying labral tear.  I did discuss the role for possible need for future repair and revision to a subpectoral tenodesis.  We will plan to proceed with an MRI to assess his repair Plan :    -Plan for MRI left shoulder and follow-up to discuss results     I personally saw and evaluated the patient, and participated in the management and treatment plan.  Huel Cote, MD Attending Physician, Orthopedic  Surgery  This document was dictated using Dragon voice recognition software. A reasonable attempt at proof reading has been made to minimize errors.

## 2023-04-17 ENCOUNTER — Telehealth (HOSPITAL_BASED_OUTPATIENT_CLINIC_OR_DEPARTMENT_OTHER): Payer: Self-pay | Admitting: Orthopaedic Surgery

## 2023-04-17 NOTE — Telephone Encounter (Signed)
Patient wants to know if he can get cortisol injection before MRI. Best contact [phone]

## 2023-04-20 NOTE — Telephone Encounter (Signed)
Lvm advising  

## 2023-04-21 ENCOUNTER — Ambulatory Visit (HOSPITAL_BASED_OUTPATIENT_CLINIC_OR_DEPARTMENT_OTHER): Payer: BC Managed Care – PPO | Admitting: Student

## 2023-04-21 DIAGNOSIS — M25512 Pain in left shoulder: Secondary | ICD-10-CM | POA: Diagnosis not present

## 2023-04-21 DIAGNOSIS — G8929 Other chronic pain: Secondary | ICD-10-CM | POA: Diagnosis not present

## 2023-04-21 MED ORDER — LIDOCAINE HCL 1 % IJ SOLN
4.0000 mL | INTRAMUSCULAR | Status: AC | PRN
Start: 2023-04-21 — End: 2023-04-21
  Administered 2023-04-21: 4 mL

## 2023-04-21 MED ORDER — TRIAMCINOLONE ACETONIDE 40 MG/ML IJ SUSP
2.0000 mL | INTRAMUSCULAR | Status: AC | PRN
Start: 2023-04-21 — End: 2023-04-21
  Administered 2023-04-21: 2 mL via INTRA_ARTICULAR

## 2023-04-21 NOTE — Progress Notes (Signed)
HPI: Patient is here today for ultrasound-guided cortisone injection of the left glenohumeral joint.  He is scheduled for a shoulder MRI on 05/18/2023.   Physical Exam: Previous arthroscopic incision scars that are well-healed noted over the right shoulder.  No evidence of erythema or warmth.   Procedure Note  Patient: Kent Johnson             Date of Birth: [birth year]           MRN: [id number]             Visit Date: 04/21/2023  Procedures: Visit Diagnoses:  1. Chronic left shoulder pain     Large Joint Inj: L glenohumeral on 04/21/2023 2:41 PM Indications: pain Details: 22 G 1.5 in needle, ultrasound-guided anterior approach Medications: 4 mL lidocaine 1 %; 2 mL triamcinolone acetonide 40 MG/ML Outcome: tolerated well, no immediate complications Procedure, treatment alternatives, risks and benefits explained, specific risks discussed. Consent was given by the patient. Immediately prior to procedure a time out was called to verify the correct patient, procedure, equipment, support staff and site/side marked as required. Patient was prepped and draped in the usual sterile fashion.      Plan:  Return to clinic as needed and proceed with right shoulder MRI as planned.    I personally saw and evaluated the patient, and participated in the management and treatment plan.  Hazle Nordmann, PA-C Orthopedics

## 2023-04-26 ENCOUNTER — Emergency Department (HOSPITAL_COMMUNITY)
Admission: EM | Admit: 2023-04-26 | Discharge: 2023-04-26 | Disposition: A | Discharge status: Home / Self Care | Payer: BC Managed Care – PPO | Attending: Emergency Medicine | Admitting: Emergency Medicine

## 2023-04-26 ENCOUNTER — Other Ambulatory Visit: Payer: Self-pay

## 2023-04-26 ENCOUNTER — Encounter (HOSPITAL_COMMUNITY): Payer: Self-pay | Admitting: Emergency Medicine

## 2023-04-26 DIAGNOSIS — R21 Rash and other nonspecific skin eruption: Secondary | ICD-10-CM | POA: Diagnosis present

## 2023-04-26 DIAGNOSIS — T7840XA Allergy, unspecified, initial encounter: Secondary | ICD-10-CM | POA: Insufficient documentation

## 2023-04-26 MED ORDER — FAMOTIDINE IN NACL 20-0.9 MG/50ML-% IV SOLN
20.0000 mg | Freq: Once | INTRAVENOUS | Status: AC
  Administered 2023-04-26: 20 mg via INTRAVENOUS
  Filled 2023-04-26: qty 50

## 2023-04-26 MED ORDER — EPINEPHRINE 0.3 MG/0.3ML IJ SOAJ: 0.3000 mg | INTRAMUSCULAR | 1 refills | Status: DC | PRN

## 2023-04-26 MED ORDER — METHYLPREDNISOLONE SODIUM SUCC 125 MG IJ SOLR
125.0000 mg | Freq: Once | INTRAMUSCULAR | Status: AC
  Administered 2023-04-26: 125 mg via INTRAVENOUS
  Filled 2023-04-26: qty 2

## 2023-04-26 NOTE — ED Triage Notes (Addendum)
PT came POV for allergic rx to bee sting to right index finger at approx. 10:30 am.. His finger is red and swollen.  He states he had hives, CP, and tightness in throat. Airway patent and pt not in obvious distress during triage.  He took 50mg  of childrens liquid benadryl and 600mg  of Advil. Pt has applied Ice to finger

## 2023-04-26 NOTE — Discharge Instructions (Signed)
You have been seen today for your complaint of allergic reaction. Your discharge medications include an EpiPen prescription.  Take this as needed for signs and symptoms of anaphylaxis. Follow up with: Your PCP in 1 week for reevaluation Please seek immediate medical care if you develop any of the following symptoms: You have symptoms of an allergic reaction. You use an auto-injector pen. You will need more medical care even if the medicine seems to be working. An anaphylactic reaction may happen again within 72 hours (rebound anaphylaxis). At this time there does not appear to be the presence of an emergent medical condition, however there is always the potential for conditions to change. Please read and follow the below instructions.  Do not take your medicine if  develop an itchy rash, swelling in your mouth or lips, or difficulty breathing; call 911 and seek immediate emergency medical attention if this occurs.  You may review your lab tests and imaging results in their entirety on your MyChart account.  Please discuss all results of fully with your primary care provider and other specialist at your follow-up visit.  Note: Portions of this text may have been transcribed using voice recognition software. Every effort was made to ensure accuracy; however, inadvertent computerized transcription errors may still be present.

## 2023-04-26 NOTE — ED Provider Notes (Signed)
[place] EMERGENCY DEPARTMENT AT [facility] Provider Note   CSN: [id number] Arrival date & time: 04/26/23  1154     History  Chief Complaint  Patient presents with   Allergic Reaction    Kent Johnson is a 37 y.o. male.  Presenting to the ED for evaluation of an allergic reaction.  He states he was walking his dog at approximately 10 AM when he was bit/stung on the distal portion of his right index finger by a bee.  He believes this occurred at 10 AM today.  He reported immediate significant pain to the sting site.  When he got home, he noticed diffuse hives.  He then took 40 mg of Benadryl and 600 mg ibuprofen.  He states that the hives improved shortly afterwards, but after that he developed facial swelling and fell like his throat was closing up and some intermittent chest tightness sensations.  He says that the symptoms resolved prior to being seen in triage here, however he still has the intermittent chest tightness.  He denies throat swelling sensation or shortness of breath or lip or face swelling at the time of my initial evaluation.  No nausea or vomiting.   Allergic Reaction Presenting symptoms: rash        Home Medications Prior to Admission medications   Medication Sig Start Date End Date Taking? Authorizing Provider  EPINEPHrine 0.3 mg/0.3 mL IJ SOAJ injection Inject 0.3 mg into the muscle as needed for anaphylaxis. 04/26/23  Yes Herald Vallin, Edsel Petrin, PA-C  ascorbic acid (VITAMIN C) 500 MG tablet Take 500 mg by mouth daily.    [provider]  Cholecalciferol (VITAMIN D3) 125 MCG (5000 UT) TABS Take 5,000 Units by mouth daily.    [provider]  cyanocobalamin (VITAMIN B12) 1000 MCG tablet Take 1,000 mcg by mouth daily.    [provider]  escitalopram (LEXAPRO) 10 MG tablet TAKE 1 TABLET(10 MG) BY MOUTH DAILY 04/08/23   [name], [name] G, MD  fexofenadine (ALLEGRA) 180 MG tablet Take 180 mg by mouth daily as needed for  allergies.    [provider]      Allergies    Patient has no known allergies.    Review of Systems   Review of Systems  Respiratory:  Positive for chest tightness.   Skin:  Positive for rash.  All other systems reviewed and are negative.   Physical Exam Updated Vital Signs BP (!) 128/97   Pulse 64   Temp 97.8 F (36.6 C) (Oral)   Resp 15   Ht 5\' 11"  (1.803 m)   Wt 81.8 kg   SpO2 99%   BMI 25.15 kg/m  Physical Exam Vitals and nursing note reviewed.  Constitutional:      General: He is not in acute distress.    Appearance: Normal appearance. He is well-developed. He is not ill-appearing, toxic-appearing or diaphoretic.     Comments: Resting comfortably in bed  HENT:     Head: Normocephalic and atraumatic.  Eyes:     Conjunctiva/sclera: Conjunctivae normal.  Cardiovascular:     Rate and Rhythm: Normal rate and regular rhythm.     Heart sounds: No murmur heard. Pulmonary:     Effort: Pulmonary effort is normal. No respiratory distress.     Breath sounds: Normal breath sounds. No stridor. No wheezing, rhonchi or rales.  Abdominal:     Palpations: Abdomen is soft.     Tenderness: There is no abdominal tenderness. There is no  guarding.  Musculoskeletal:        General: No swelling.     Cervical back: Normal range of motion and neck supple.     Comments: Mild swelling and erythema to the right distal index finger.  Full ROM.  Capillary refill normal.  Sensation intact.  Skin:    General: Skin is warm and dry.     Capillary Refill: Capillary refill takes less than 2 seconds.  Neurological:     Mental Status: He is alert.  Psychiatric:        Mood and Affect: Mood normal.     ED Results / Procedures / Treatments   Labs (all labs ordered are listed, but only abnormal results are displayed) Labs Reviewed - No data to display  EKG None  Radiology No results found.  Procedures Procedures    Medications Ordered in ED Medications   methylPREDNISolone sodium succinate (SOLU-MEDROL) 125 mg/2 mL injection 125 mg (125 mg Intravenous Given 04/26/23 1308)  famotidine (PEPCID) IVPB 20 mg premix (0 mg Intravenous Stopped 04/26/23 1402)    ED Course/ Medical Decision Making/ A&P                                 Medical Decision Making Risk Prescription drug management.   This patient presents to the ED for concern of allergic reaction, this involves an extensive number of treatment options, and is a complaint that carries with it a high risk of complications and morbidity.  The differential diagnosis includes anaphylaxis, mild allergic reaction  My initial workup includes symptom control, monitoring  Additional history obtained from: Nursing notes from this visit. Family wife at bedside provides a portion of the history  Afebrile, hemodynamically stable.  37 year old male presenting for evaluation of a potential allergic reaction.  He was stung by right index finger by a bee.  He had some hives, chest tightness, throat closing symptoms.  The symptoms improved prior to arrival.  He did take Benadryl for the symptoms.  On exam, he was complaining of some intermittent chest tightness sensations but was otherwise without any complaints.  No adventitious breath sounds.  Specifically no stridor.  No hives appreciated on my exam.  Patient was given Pepcid and Solu-Medrol here in the emergency department.  He reported full resolution of his symptoms at that time.  Overall low suspicion for anaphylactic reaction at this time, however will send patient a prescription for EpiPen's given the severity of his complaints prior to arrival.  He was encouraged to follow-up with his primary care provider in 1 week for reevaluation of his symptoms.  He was given return precautions.  Stable at discharge.  At this time there does not appear to be any evidence of an acute emergency medical condition and the patient appears stable for discharge with  appropriate outpatient follow up. Diagnosis was discussed with patient who verbalizes understanding of care plan and is agreeable to discharge. I have discussed return precautions with patient who verbalizes understanding. Patient encouraged to follow-up with their PCP within 1 week. All questions answered.  Patient's case discussed with Dr. Theresia Lo who agrees with plan to discharge with follow-up.   Note: Portions of this report may have been transcribed using voice recognition software. Every effort was made to ensure accuracy; however, inadvertent computerized transcription errors may still be present.        Final Clinical Impression(s) / ED Diagnoses Final diagnoses:  Allergic reaction,  initial encounter    Rx / DC Orders ED Discharge Orders          Ordered    EPINEPHrine 0.3 mg/0.3 mL IJ SOAJ injection  As needed        04/26/23 1440              Michelle Piper, PA-C 04/26/23 1440    Elayne Snare K, DO 04/26/23 [phone]

## 2023-04-27 ENCOUNTER — Other Ambulatory Visit: Payer: Self-pay

## 2023-04-27 ENCOUNTER — Emergency Department (HOSPITAL_COMMUNITY)
Admission: EM | Admit: 2023-04-27 | Discharge: 2023-04-27 | Discharge status: Against Medical Advice | Payer: BC Managed Care – PPO | Attending: Emergency Medicine | Admitting: Emergency Medicine

## 2023-04-27 ENCOUNTER — Encounter (HOSPITAL_COMMUNITY): Payer: Self-pay | Admitting: *Deleted

## 2023-04-27 DIAGNOSIS — W57XXXA Bitten or stung by nonvenomous insect and other nonvenomous arthropods, initial encounter: Secondary | ICD-10-CM | POA: Insufficient documentation

## 2023-04-27 DIAGNOSIS — S60460A Insect bite (nonvenomous) of right index finger, initial encounter: Secondary | ICD-10-CM | POA: Diagnosis present

## 2023-04-27 DIAGNOSIS — Z5321 Procedure and treatment not carried out due to patient leaving prior to being seen by health care provider: Secondary | ICD-10-CM | POA: Diagnosis not present

## 2023-04-27 NOTE — ED Triage Notes (Signed)
The pt was stung on his rt index finger yesterday and was seen for an allergic reaction.  The reaction is now gone but he has severe pain in his rt index finger where he was stung  and he has not foound anyhting but ice to help the pain

## 2023-04-27 NOTE — ED Notes (Signed)
Patient pulled OTF- stated he would come back later today or go to urgent care.

## 2023-05-15 ENCOUNTER — Ambulatory Visit (HOSPITAL_BASED_OUTPATIENT_CLINIC_OR_DEPARTMENT_OTHER): Payer: BC Managed Care – PPO | Admitting: Orthopaedic Surgery

## 2023-05-18 ENCOUNTER — Ambulatory Visit
Admission: RE | Admit: 2023-05-18 | Discharge: 2023-05-18 | Disposition: A | Discharge status: Home / Self Care | Payer: BC Managed Care – PPO | Source: Ambulatory Visit | Attending: Orthopaedic Surgery | Admitting: Orthopaedic Surgery

## 2023-05-18 ENCOUNTER — Ambulatory Visit
Admission: RE | Admit: 2023-05-18 | Discharge: 2023-05-18 | Disposition: A | Discharge status: Home / Self Care | Payer: BC Managed Care – PPO | Source: Ambulatory Visit | Attending: Orthopaedic Surgery

## 2023-05-18 DIAGNOSIS — G8929 Other chronic pain: Secondary | ICD-10-CM

## 2023-05-18 MED ORDER — IOPAMIDOL (ISOVUE-M 200) INJECTION 41%
10.0000 mL | Freq: Once | INTRAMUSCULAR | Status: AC
  Administered 2023-05-18: 10 mL via INTRA_ARTICULAR

## 2023-05-22 ENCOUNTER — Ambulatory Visit (HOSPITAL_BASED_OUTPATIENT_CLINIC_OR_DEPARTMENT_OTHER): Payer: BC Managed Care – PPO | Admitting: Orthopaedic Surgery

## 2023-05-22 ENCOUNTER — Ambulatory Visit (HOSPITAL_BASED_OUTPATIENT_CLINIC_OR_DEPARTMENT_OTHER): Payer: Self-pay | Admitting: Orthopaedic Surgery

## 2023-05-22 DIAGNOSIS — S46892A Other injury of other muscles, fascia and tendons at shoulder and upper arm level, left arm, initial encounter: Secondary | ICD-10-CM | POA: Diagnosis not present

## 2023-05-22 NOTE — Progress Notes (Signed)
Post Operative Evaluation    Procedure/Date of Surgery: Left shoulder removal of loose body and subscapularis repair 03/11/22  Interval History:   05/22/2023: Presents today for follow-up of his left shoulder.  He is here today for MRI discussion.  Having persistent popping and clicking in the left shoulder  Presents today 1 year out from the above procedure.  Overall he is doing generally well although he is still having persistent limited function when he is being very active.  He feels like the shoulder is somewhat disconnected or not fully strong.  There is some tenderness about the groove of the biceps as well.  PMH/PSH/Family History/Social History/Meds/Allergies:    Past Medical History:  Diagnosis Date   [place]   Arthritis 2021   rt knee   Routine general medical examination at a health care facility 04/08/2023    Past Surgical History:  Procedure Laterality Date   CLAVICLE SURGERY  2011   broke collar bone skiing   COLON SURGERY  around 2007   benign lipoma inside of colon lead to intusseption   LIPOMA EXCISION  2007   benign lipoma in colon, caused intersepcion   SHOULDER ARTHROSCOPY WITH DEBRIDEMENT AND BICEP TENDON REPAIR Left 03/11/2022   Procedure: LEFT SHOULDER ARTHROSCOPY WITH MINI OPEN SUBSCAPULARIS REPAIR  AND BICEP TENDODESIS;  Surgeon: Huel Cote, MD;  Location: [facility];  Service: Orthopedics;  Laterality: Left;   WOUND EXPLORATION N/A 05/23/2022   Procedure: WOUND EXPLORATION WITH REMOVAL OF FOREIGN BODY OF ABDOMINAL WALL;  Surgeon: Darnell Level, MD;  Location: [facility];  Service: General;  Laterality: N/A;   Social History   Socioeconomic History   Marital status: Married    Spouse name: Fleet Contras   Number of children: Not on file   Years of education: Not on file   Highest education level: Master's degree (e.g., MA, MS, MEng, MEd, MSW, MBA)  Occupational History   Not on file   Tobacco Use   Smoking status: Some Days    Types: Cigarettes   Smokeless tobacco: Never   Tobacco comments:    2 cig per year.  Vaping Use   Vaping status: Not on file  Substance and Sexual Activity   Alcohol use: Yes    Comment: occas   Drug use: Never   Sexual activity: Yes    Partners: Female  Other Topics Concern   Not on file  Social History Narrative   Not on file   Social Determinants of Health   Financial Resource Strain: Low Risk  (04/04/2023)   Overall Financial Resource Strain (CARDIA)    Difficulty of Paying Living Expenses: Not hard at all  Food Insecurity: No Food Insecurity (04/04/2023)   Hunger Vital Sign    Worried About Running Out of Food in the Last Year: Never true    Ran Out of Food in the Last Year: Never true  Transportation Needs: No Transportation Needs (04/04/2023)   PRAPARE - Administrator, Civil Service (Medical): No    Lack of Transportation (Non-Medical): No  Physical Activity: Sufficiently Active (04/04/2023)   Exercise Vital Sign    Days of Exercise per Week: 5 days    Minutes of Exercise per Session: 30 min  Stress: No Stress Concern Present (04/04/2023)   [place]  Institute of Occupational Health - Occupational Stress Questionnaire    Feeling of Stress : Only a little  Social Connections: Moderately Integrated (04/04/2023)   Social Connection and Isolation Panel [NHANES]    Frequency of Communication with Friends and Family: Once a week    Frequency of Social Gatherings with Friends and Family: Once a week    Attends Religious Services: 1 to 4 times per year    Active Member of [employer] or Organizations: Yes    Attends Engineer, structural: More than 4 times per year    Marital Status: Married   Family History  Problem Relation Age of Onset   Anxiety disorder Mother    Cancer Mother        melanoma   Miscarriages / Stillbirths Mother    Asthma Brother    Arthritis Maternal Grandmother    Cancer Maternal  Grandfather    Heart attack Maternal Grandfather    No Known Allergies Current Outpatient Medications  Medication Sig Dispense Refill   ascorbic acid (VITAMIN C) 500 MG tablet Take 500 mg by mouth daily.     Cholecalciferol (VITAMIN D3) 125 MCG (5000 UT) TABS Take 5,000 Units by mouth daily.     cyanocobalamin (VITAMIN B12) 1000 MCG tablet Take 1,000 mcg by mouth daily.     EPINEPHrine 0.3 mg/0.3 mL IJ SOAJ injection Inject 0.3 mg into the muscle as needed for anaphylaxis. 1 each 1   escitalopram (LEXAPRO) 10 MG tablet TAKE 1 TABLET(10 MG) BY MOUTH DAILY 90 tablet 2   fexofenadine (ALLEGRA) 180 MG tablet Take 180 mg by mouth daily as needed for allergies.     No current facility-administered medications for this visit.   No results found.  Review of Systems:   A ROS was performed including pertinent positives and negatives as documented in the HPI.   Musculoskeletal Exam:    There were no vitals taken for this visit.  Left shoulder portals are healed.  Able to flex and extend at the left elbow.  Active forward elevation of the left shoulder is 170 degrees equal to the contralateral side, external rotation actively is to 70 degrees equal to the contralateral side.  Internal rotation is to T12 bilaterally.  Distal neurosensory exam is intact.  There is some weakness on the left compared to the contralateral side with forward elevation  Imaging:    MRI left shoulder: There is evidence of a large anterior chondral defect where his previous loose body emanated from.  There is evidence of a full-thickness subscapularis tear.  Supraspinatus is thin but intact  I personally reviewed and interpreted the radiographs.   Assessment:   72-month status post left arthroscopic removal of loose body subscapularis repair with biceps tendon repair.  At today's visit I did describe that he does have evidence of a large osteochondral lesion anteriorly where his donor site likely emanated from  anteriorly.  I described that this is now much clearer and is likely engaging as he goes through motion and overhead activity.  I did describe as well that unfortunately I do see evidence of a subscapularis tear on his MRI.  Given the large size of his osteochondral defect we did discuss treatment options.  At this time I do believe he would ultimately benefit from an open subscapularis repair with a possible dermal patch augmentation.  I did discuss that I would also recommend revising his tenodesis to a subpectoral tenodesis at bedtime.  Given the fact that he would  require a subscapularis repair I do ultimately believe he may benefit from a modified McLaughlin procedure where the subscapularis was repaired into the bed of his large osteochondral lesion.  We did discuss the alternatives including a large osteochondral graft although after discussion of all the options he would be fine with improvement in the clicking and popping and is willing to sacrifice external rotation for this.  After discussions of risks and benefits as well as limitations he has elected for this Plan :    -Plan for left shoulder open subscapularis repair, pectoral biceps tenodesis, possible dermal augmentation    After a lengthy discussion of treatment options, including risks, benefits, alternatives, complications of surgical and nonsurgical conservative options, the patient elected surgical repair.   The patient  is aware of the material risks  and complications including, but not limited to injury to adjacent structures, neurovascular injury, infection, numbness, bleeding, implant failure, thermal burns, stiffness, persistent pain, failure to heal, disease transmission from allograft, need for further surgery, dislocation, anesthetic risks, blood clots, risks of death,and others. The probabilities of surgical success and failure discussed with patient given their particular co-morbidities.The time and nature of expected  rehabilitation and recovery was discussed.The patient's questions were all answered preoperatively.  No barriers to understanding were noted. I explained the natural history of the disease process and Rx rationale.  I explained to the patient what I considered to be reasonable expectations given their personal situation.  The final treatment plan was arrived at through a shared patient decision making process model.   I personally saw and evaluated the patient, and participated in the management and treatment plan.  Huel Cote, MD Attending Physician, Orthopedic Surgery  This document was dictated using Dragon voice recognition software. A reasonable attempt at proof reading has been made to minimize errors.

## 2023-05-25 ENCOUNTER — Ambulatory Visit (HOSPITAL_BASED_OUTPATIENT_CLINIC_OR_DEPARTMENT_OTHER): Payer: BC Managed Care – PPO | Admitting: Orthopaedic Surgery

## 2023-08-19 ENCOUNTER — Encounter (HOSPITAL_BASED_OUTPATIENT_CLINIC_OR_DEPARTMENT_OTHER): Payer: Self-pay | Admitting: Orthopaedic Surgery

## 2023-08-19 ENCOUNTER — Ambulatory Visit (HOSPITAL_BASED_OUTPATIENT_CLINIC_OR_DEPARTMENT_OTHER): Payer: BC Managed Care – PPO | Admitting: Orthopaedic Surgery

## 2023-08-24 ENCOUNTER — Other Ambulatory Visit: Payer: Self-pay

## 2023-08-24 ENCOUNTER — Encounter (HOSPITAL_BASED_OUTPATIENT_CLINIC_OR_DEPARTMENT_OTHER): Payer: Self-pay | Admitting: Orthopaedic Surgery

## 2023-08-30 ENCOUNTER — Encounter (HOSPITAL_BASED_OUTPATIENT_CLINIC_OR_DEPARTMENT_OTHER): Payer: Self-pay | Admitting: Orthopaedic Surgery

## 2023-08-30 ENCOUNTER — Ambulatory Visit (HOSPITAL_COMMUNITY)
Admission: EM | Admit: 2023-08-30 | Discharge: 2023-08-30 | Disposition: A | Discharge status: Home / Self Care | Payer: BC Managed Care – PPO

## 2023-08-30 NOTE — ED Notes (Signed)
Called x2 for triage. No answer.

## 2023-08-30 NOTE — ED Notes (Signed)
Called x1 for triage. No answer

## 2023-08-31 ENCOUNTER — Encounter: Payer: Self-pay | Admitting: Family Medicine

## 2023-08-31 ENCOUNTER — Ambulatory Visit (INDEPENDENT_AMBULATORY_CARE_PROVIDER_SITE_OTHER): Payer: BC Managed Care – PPO | Admitting: Family Medicine

## 2023-08-31 VITALS — BP 102/70 | HR 100 | Temp 100.0°F | Resp 12 | Ht 71.0 in | Wt 187.0 lb

## 2023-08-31 DIAGNOSIS — J101 Influenza due to other identified influenza virus with other respiratory manifestations: Secondary | ICD-10-CM | POA: Diagnosis not present

## 2023-08-31 DIAGNOSIS — R051 Acute cough: Secondary | ICD-10-CM | POA: Diagnosis not present

## 2023-08-31 DIAGNOSIS — J989 Respiratory disorder, unspecified: Secondary | ICD-10-CM | POA: Diagnosis not present

## 2023-08-31 MED ORDER — PREDNISONE 20 MG PO TABS: 40.0000 mg | ORAL_TABLET | Freq: Every day | ORAL | 0 refills | Status: AC

## 2023-08-31 MED ORDER — BENZONATATE 100 MG PO CAPS: 100.0000 mg | ORAL_CAPSULE | Freq: Two times a day (BID) | ORAL | 0 refills | Status: AC | PRN

## 2023-08-31 MED ORDER — ALBUTEROL SULFATE HFA 108 (90 BASE) MCG/ACT IN AERS: 2.0000 | INHALATION_SPRAY | Freq: Four times a day (QID) | RESPIRATORY_TRACT | 0 refills | Status: DC | PRN

## 2023-08-31 NOTE — Progress Notes (Signed)
 ACUTE VISIT Chief Complaint  Patient presents with   Follow-up    Symptoms started last Tuesday, dx with flu. Having some wheezing when exhaling, concerned about bronchitis or pneumonia    HPI: Mr.Kent Johnson is a 38 y.o. male with a PMHx significant for anxiety and insomnia, who is here today with above concerns.   His symptoms have included subjective fever, chills, diaphoresis, wheezing on exhalation, some SOB, productive cough with occasional minimal blood in his sputum.  He is concerned he may have developed bronchitis or pneumonia.   Patient was in [place]  when symptoms started and took a rapid test that was positive for Influenza A on 08/25/23.   Cough This is a new problem. The current episode started in the past 7 days. The problem has been unchanged. The problem occurs every few hours. The cough is Productive of sputum. Pertinent negatives include no myalgias. He has tried OTC cough suppressant for the symptoms. His past medical history is significant for environmental allergies. There is no history of asthma or COPD.   He has been taking Dayquil, nyquil, and advil .   He mentions his wife and kids have also had the flu recently, but his symptoms have been worse and lasted longer.  Pertinent negatives include CP, nausea, diarrhea,or skin rash.  Review of Systems  Constitutional:  Positive for activity change, appetite change and fatigue.  HENT:  Positive for congestion. Negative for facial swelling and mouth sores.   Respiratory:  Positive for cough.   Gastrointestinal:  Negative for abdominal pain and vomiting.  Genitourinary:  Negative for decreased urine volume, dysuria and hematuria.  Musculoskeletal:  Negative for gait problem and myalgias.  Allergic/Immunologic: Positive for environmental allergies.  Neurological:  Negative for syncope and facial asymmetry.  Psychiatric/Behavioral:  Negative for confusion and hallucinations.   See other pertinent  positives and negatives in HPI.  Current Outpatient Medications on File Prior to Visit  Medication Sig Dispense Refill   ascorbic acid (VITAMIN C) 500 MG tablet Take 500 mg by mouth daily.     Cholecalciferol (VITAMIN D3) 125 MCG (5000 UT) TABS Take 5,000 Units by mouth daily.     cyanocobalamin (VITAMIN B12) 1000 MCG tablet Take 1,000 mcg by mouth daily.     EPINEPHrine  0.3 mg/0.3 mL IJ SOAJ injection Inject 0.3 mg into the muscle as needed for anaphylaxis. 1 each 1   escitalopram  (LEXAPRO ) 10 MG tablet TAKE 1 TABLET(10 MG) BY MOUTH DAILY 90 tablet 2   fexofenadine (ALLEGRA) 180 MG tablet Take 180 mg by mouth daily as needed for allergies.     No current facility-administered medications on file prior to visit.   Past Medical History:  Diagnosis Date   [place]    Arthritis 2021   rt knee   Routine general medical examination at a health care facility 04/08/2023   No Known Allergies  Social History   Socioeconomic History   Marital status: Married    Spouse name: Kayleen Party   Number of children: Not on file   Years of education: Not on file   Highest education level: Master's degree (e.g., MA, MS, MEng, MEd, MSW, MBA)  Occupational History   Not on file  Tobacco Use   Smoking status: Some Days    Types: Cigarettes   Smokeless tobacco: Never   Tobacco comments:    2 cig per year.  Vaping Use   Vaping status: Not on file  Substance and  Sexual Activity   Alcohol use: Yes    Comment: occas   Drug use: Never   Sexual activity: Yes    Partners: Female  Other Topics Concern   Not on file  Social History Narrative   Not on file   Social Drivers of Health   Financial Resource Strain: Low Risk  (08/30/2023)   Overall Financial Resource Strain (CARDIA)    Difficulty of Paying Living Expenses: Not hard at all  Food Insecurity: No Food Insecurity (08/30/2023)   Hunger Vital Sign    Worried About Running Out of Food in the Last Year: Never true     Ran Out of Food in the Last Year: Never true  Transportation Needs: No Transportation Needs (08/30/2023)   PRAPARE - Administrator, Civil Service (Medical): No    Lack of Transportation (Non-Medical): No  Physical Activity: Sufficiently Active (08/30/2023)   Exercise Vital Sign    Days of Exercise per Week: 4 days    Minutes of Exercise per Session: 50 min  Stress: No Stress Concern Present (08/30/2023)   [employer] of Occupational Health - Occupational Stress Questionnaire    Feeling of Stress : Only a little  Social Connections: Socially Integrated (08/30/2023)   Social Connection and Isolation Panel [NHANES]    Frequency of Communication with Friends and Family: Three times a week    Frequency of Social Gatherings with Friends and Family: Once a week    Attends Religious Services: 1 to 4 times per year    Active Member of [employer] or Organizations: Yes    Attends Banker Meetings: More than 4 times per year    Marital Status: Married   Vitals:   08/31/23 0855  BP: 102/70  Pulse: 100  Resp: 12  Temp: 100 F (37.8 C)  SpO2: 98%   Body mass index is 26.08 kg/m.  Physical Exam Vitals and nursing note reviewed.  Constitutional:      General: He is not in acute distress.    Appearance: He is well-developed. He is not ill-appearing.  HENT:     Head: Normocephalic and atraumatic.     Right Ear: Tympanic membrane, ear canal and external ear normal.     Left Ear: Tympanic membrane, ear canal and external ear normal.     Nose: Rhinorrhea present.     Mouth/Throat:     Mouth: Mucous membranes are moist.     Pharynx: Oropharynx is clear. Uvula midline.  Eyes:     Conjunctiva/sclera: Conjunctivae normal.  Cardiovascular:     Rate and Rhythm: Normal rate and regular rhythm.     Heart sounds: No murmur heard. Pulmonary:     Effort: Pulmonary effort is normal. Prolonged expiration present. No respiratory distress.     Breath sounds: Normal breath sounds. No  stridor. No wheezing, rhonchi or rales.  Lymphadenopathy:     Head:     Right side of head: No submandibular adenopathy.     Left side of head: No submandibular adenopathy.     Cervical: No cervical adenopathy.  Skin:    General: Skin is warm.     Findings: No erythema or rash.  Neurological:     Mental Status: He is alert and oriented to person, place, and time.  Psychiatric:        Mood and Affect: Mood and affect normal.   ASSESSMENT AND PLAN:  Mr. Furness was seen today for URI.   Influenza A We discussed Dx,prognosis,and  treatment. Positive home test about a week ago, did not seek medical attention,so did not antiviral treatment. Continue symptomatic treatment. Recommend monitoring temp, at this time fever should be improving. Explained that cough and congestion can last a few more days and even weeks after acute symptoms have resolved. Continue OTC DayQuil and NyQuil.  Reactive airway disease without asthma I did not appreciate wheezing today but has prolonged inspiration. Recommend prednisone  40 mg with breakfast for 3 to 5 days. Albuterol  inh 2 puff every 6 hours for a week then as needed for wheezing or shortness of breath.  Instructed about warning signs.  -     Albuterol  Sulfate HFA; Inhale 2 puffs into the lungs every 6 (six) hours as needed for wheezing or shortness of breath.  Dispense: 8 g; Refill: 0 -     predniSONE ; Take 2 tablets (40 mg total) by mouth daily with breakfast for 5 days.  Dispense: 10 tablet; Refill: 0  Acute cough Lung auscultation does not suggest pneumonia. I think we can hold on CXR for now. OTC Mucinex and Benzonatate  recommended for cough management. Albuterol  inh and Prednisone  may also help. If problem is not significantly improved in a week, we can obtain a CXR.  -     Benzonatate ; Take 1 capsule (100 mg total) by mouth 2 (two) times daily as needed for up to 10 days.  Dispense: 20 capsule; Refill: 0  Return if symptoms worsen or  fail to improve.  I, Kent Johnson, acting as a scribe for Kent Tiedt [name], MD., have documented all relevant documentation on the behalf of Kent Merfeld [name], MD, as directed by  Kent Litzinger [name], MD while in the presence of Kent Reicks [name], MD.   I, Kent Coolman [name], MD, have reviewed all documentation for this visit. The documentation on 08/31/23 for the exam, diagnosis, procedures, and orders are all accurate and complete.  Kent Colin G. [name], MD  [facility]. [place] office.

## 2023-08-31 NOTE — Patient Instructions (Addendum)
 A few things to remember from today's visit:  Influenza A  Reactive airway disease without asthma - Plan: albuterol  (VENTOLIN  HFA) 108 (90 Base) MCG/ACT inhaler, predniSONE  (DELTASONE ) 20 MG tablet  Acute cough - Plan: benzonatate  (TESSALON ) 100 MG capsule  Continue Mucinex. Prednisone  to take for 3-5 days with breakfast. Monitor temp. If not better in 7 days we can arrange an X ray but congestion and cough can last a few more days and sometimes weeks. Albuterol  inh 2 puff every 6 hours for a week then as needed for wheezing or shortness of breath.   Avoid smoking.  If you need refills for medications you take chronically, please call your pharmacy. Do not use My Chart to request refills or for acute issues that need immediate attention. If you send a my chart message, it may take a few days to be addressed, specially if I am not in the office.  Please be sure medication list is accurate. If a new problem present, please set up appointment sooner than planned today.

## 2023-09-01 ENCOUNTER — Ambulatory Visit (HOSPITAL_BASED_OUTPATIENT_CLINIC_OR_DEPARTMENT_OTHER)
Admission: RE | Admit: 2023-09-01 | Payer: BC Managed Care – PPO | Source: Home / Self Care | Admitting: Orthopaedic Surgery

## 2023-09-01 SURGERY — REPAIR, SHOULDER, SUBSCAPULAR, OPEN
Anesthesia: General | Laterality: Left

## 2023-09-02 ENCOUNTER — Ambulatory Visit (HOSPITAL_BASED_OUTPATIENT_CLINIC_OR_DEPARTMENT_OTHER): Payer: BC Managed Care – PPO | Admitting: Orthopaedic Surgery

## 2023-09-16 ENCOUNTER — Encounter (HOSPITAL_BASED_OUTPATIENT_CLINIC_OR_DEPARTMENT_OTHER): Payer: BC Managed Care – PPO | Admitting: Orthopaedic Surgery

## 2023-10-07 ENCOUNTER — Encounter: Payer: Self-pay | Admitting: Family Medicine

## 2023-10-07 NOTE — Telephone Encounter (Signed)
 I called and spoke with pharmacist, Onalee Hua, at [facility] and they will cancel the Rx for Lexapro.

## 2023-12-17 ENCOUNTER — Other Ambulatory Visit (HOSPITAL_BASED_OUTPATIENT_CLINIC_OR_DEPARTMENT_OTHER): Payer: Self-pay | Admitting: Orthopaedic Surgery

## 2023-12-17 DIAGNOSIS — S46892A Other injury of other muscles, fascia and tendons at shoulder and upper arm level, left arm, initial encounter: Secondary | ICD-10-CM

## 2024-02-02 ENCOUNTER — Encounter (HOSPITAL_BASED_OUTPATIENT_CLINIC_OR_DEPARTMENT_OTHER): Payer: Self-pay | Admitting: Orthopaedic Surgery

## 2024-02-02 ENCOUNTER — Other Ambulatory Visit: Payer: Self-pay

## 2024-02-02 NOTE — Progress Notes (Signed)
 Provided patient with Ensure and pre-surgical soap. Explained directions. Patient verbalized understanding.

## 2024-02-08 ENCOUNTER — Ambulatory Visit (HOSPITAL_BASED_OUTPATIENT_CLINIC_OR_DEPARTMENT_OTHER): Payer: Self-pay | Admitting: Orthopaedic Surgery

## 2024-02-08 DIAGNOSIS — S46892A Other injury of other muscles, fascia and tendons at shoulder and upper arm level, left arm, initial encounter: Secondary | ICD-10-CM

## 2024-02-09 ENCOUNTER — Telehealth (HOSPITAL_BASED_OUTPATIENT_CLINIC_OR_DEPARTMENT_OTHER): Payer: Self-pay | Admitting: Orthopaedic Surgery

## 2024-02-09 ENCOUNTER — Ambulatory Visit (HOSPITAL_BASED_OUTPATIENT_CLINIC_OR_DEPARTMENT_OTHER): Admitting: Anesthesiology

## 2024-02-09 ENCOUNTER — Encounter (HOSPITAL_BASED_OUTPATIENT_CLINIC_OR_DEPARTMENT_OTHER)
Admission: RE | Disposition: A | Discharge status: Home / Self Care | Payer: Self-pay | Source: Home / Self Care | Attending: Orthopaedic Surgery

## 2024-02-09 ENCOUNTER — Encounter (HOSPITAL_BASED_OUTPATIENT_CLINIC_OR_DEPARTMENT_OTHER): Payer: Self-pay | Admitting: Orthopaedic Surgery

## 2024-02-09 ENCOUNTER — Other Ambulatory Visit: Payer: Self-pay

## 2024-02-09 ENCOUNTER — Ambulatory Visit (HOSPITAL_BASED_OUTPATIENT_CLINIC_OR_DEPARTMENT_OTHER)
Admission: RE | Admit: 2024-02-09 | Discharge: 2024-02-09 | Disposition: A | Discharge status: Home / Self Care | Attending: Orthopaedic Surgery | Admitting: Orthopaedic Surgery

## 2024-02-09 DIAGNOSIS — S46892A Other injury of other muscles, fascia and tendons at shoulder and upper arm level, left arm, initial encounter: Secondary | ICD-10-CM | POA: Diagnosis not present

## 2024-02-09 DIAGNOSIS — M75122 Complete rotator cuff tear or rupture of left shoulder, not specified as traumatic: Secondary | ICD-10-CM | POA: Insufficient documentation

## 2024-02-09 DIAGNOSIS — F419 Anxiety disorder, unspecified: Secondary | ICD-10-CM | POA: Diagnosis not present

## 2024-02-09 DIAGNOSIS — Z87891 Personal history of nicotine dependence: Secondary | ICD-10-CM | POA: Insufficient documentation

## 2024-02-09 HISTORY — PX: SHOULDER ARTHROSCOPY: SHX128

## 2024-02-09 HISTORY — PX: BICEPT TENODESIS: SHX5116

## 2024-02-09 HISTORY — PX: OPEN SUBSCAPULARIS REPAIR: SHX6233

## 2024-02-09 MED ORDER — DEXMEDETOMIDINE HCL IN NACL 80 MCG/20ML IV SOLN
INTRAVENOUS | Status: DC | PRN
  Administered 2024-02-09: 8 ug via INTRAVENOUS

## 2024-02-09 MED ORDER — ACETAMINOPHEN 500 MG PO TABS: 500.0000 mg | ORAL_TABLET | Freq: Three times a day (TID) | ORAL | 0 refills | Status: AC

## 2024-02-09 MED ORDER — LIDOCAINE 2% (20 MG/ML) 5 ML SYRINGE
INTRAMUSCULAR | Status: AC
  Filled 2024-02-09: qty 5

## 2024-02-09 MED ORDER — ONDANSETRON HCL 4 MG/2ML IJ SOLN
INTRAMUSCULAR | Status: AC
  Filled 2024-02-09: qty 2

## 2024-02-09 MED ORDER — SODIUM CHLORIDE 0.9 % IR SOLN
Status: DC | PRN
Start: 2024-02-09 — End: 2024-02-09
  Administered 2024-02-09: 3000 mL

## 2024-02-09 MED ORDER — OXYCODONE HCL 5 MG PO TABS: 5.0000 mg | ORAL_TABLET | Freq: Once | ORAL | Status: DC | PRN

## 2024-02-09 MED ORDER — FENTANYL CITRATE (PF) 100 MCG/2ML IJ SOLN: 25.0000 ug | INTRAMUSCULAR | Status: DC | PRN

## 2024-02-09 MED ORDER — LACTATED RINGERS IV SOLN: INTRAVENOUS | Status: DC

## 2024-02-09 MED ORDER — ACETAMINOPHEN 500 MG PO TABS
ORAL_TABLET | ORAL | Status: AC
  Filled 2024-02-09: qty 2

## 2024-02-09 MED ORDER — ROCURONIUM BROMIDE 10 MG/ML (PF) SYRINGE
PREFILLED_SYRINGE | INTRAVENOUS | Status: AC
  Filled 2024-02-09: qty 10

## 2024-02-09 MED ORDER — GABAPENTIN 300 MG PO CAPS
ORAL_CAPSULE | ORAL | Status: AC
  Filled 2024-02-09: qty 1

## 2024-02-09 MED ORDER — SUGAMMADEX SODIUM 200 MG/2ML IV SOLN
INTRAVENOUS | Status: DC | PRN
  Administered 2024-02-09: 200 mg via INTRAVENOUS

## 2024-02-09 MED ORDER — DEXAMETHASONE SODIUM PHOSPHATE 10 MG/ML IJ SOLN
INTRAMUSCULAR | Status: DC | PRN
  Administered 2024-02-09: 10 mg via INTRAVENOUS

## 2024-02-09 MED ORDER — OXYCODONE HCL 5 MG PO TABS: 5.0000 mg | ORAL_TABLET | ORAL | 0 refills | Status: AC | PRN

## 2024-02-09 MED ORDER — PROPOFOL 10 MG/ML IV BOLUS
INTRAVENOUS | Status: DC | PRN
  Administered 2024-02-09: 200 mg via INTRAVENOUS

## 2024-02-09 MED ORDER — GLYCOPYRROLATE 0.2 MG/ML IJ SOLN
INTRAMUSCULAR | Status: DC | PRN
  Administered 2024-02-09: .2 mg via INTRAVENOUS

## 2024-02-09 MED ORDER — TRANEXAMIC ACID-NACL 1000-0.7 MG/100ML-% IV SOLN
INTRAVENOUS | Status: AC
  Filled 2024-02-09: qty 100

## 2024-02-09 MED ORDER — FENTANYL CITRATE (PF) 100 MCG/2ML IJ SOLN
INTRAMUSCULAR | Status: AC
Start: 2024-02-09 — End: 2024-02-09
  Filled 2024-02-09: qty 2

## 2024-02-09 MED ORDER — GABAPENTIN 300 MG PO CAPS
300.0000 mg | ORAL_CAPSULE | Freq: Once | ORAL | Status: AC
  Administered 2024-02-09: 300 mg via ORAL

## 2024-02-09 MED ORDER — KETOROLAC TROMETHAMINE 30 MG/ML IJ SOLN
INTRAMUSCULAR | Status: DC | PRN
  Administered 2024-02-09: 30 mg via INTRAVENOUS

## 2024-02-09 MED ORDER — MIDAZOLAM HCL 2 MG/2ML IJ SOLN
INTRAMUSCULAR | Status: AC
  Filled 2024-02-09: qty 2

## 2024-02-09 MED ORDER — ACETAMINOPHEN 500 MG PO TABS
1000.0000 mg | ORAL_TABLET | Freq: Once | ORAL | Status: AC
  Administered 2024-02-09: 1000 mg via ORAL

## 2024-02-09 MED ORDER — FENTANYL CITRATE (PF) 100 MCG/2ML IJ SOLN
INTRAMUSCULAR | Status: DC | PRN
  Administered 2024-02-09: 50 ug via INTRAVENOUS

## 2024-02-09 MED ORDER — IBUPROFEN 800 MG PO TABS: 800.0000 mg | ORAL_TABLET | Freq: Three times a day (TID) | ORAL | 0 refills | Status: AC

## 2024-02-09 MED ORDER — DEXAMETHASONE SODIUM PHOSPHATE 10 MG/ML IJ SOLN
INTRAMUSCULAR | Status: AC
  Filled 2024-02-09: qty 1

## 2024-02-09 MED ORDER — PHENYLEPHRINE HCL (PRESSORS) 10 MG/ML IV SOLN
INTRAVENOUS | Status: DC | PRN
  Administered 2024-02-09: 160 ug via INTRAVENOUS
  Administered 2024-02-09 (×2): 80 ug via INTRAVENOUS
  Administered 2024-02-09: 160 ug via INTRAVENOUS

## 2024-02-09 MED ORDER — SUGAMMADEX SODIUM 200 MG/2ML IV SOLN
INTRAVENOUS | Status: AC
Start: 2024-02-09 — End: 2024-02-09
  Filled 2024-02-09: qty 2

## 2024-02-09 MED ORDER — ACETAMINOPHEN 10 MG/ML IV SOLN: 1000.0000 mg | Freq: Once | INTRAVENOUS | Status: DC | PRN

## 2024-02-09 MED ORDER — OXYCODONE HCL 5 MG/5ML PO SOLN: 5.0000 mg | Freq: Once | ORAL | Status: DC | PRN

## 2024-02-09 MED ORDER — CEFAZOLIN SODIUM-DEXTROSE 2-4 GM/100ML-% IV SOLN
2.0000 g | INTRAVENOUS | Status: AC
  Administered 2024-02-09: 2 g via INTRAVENOUS

## 2024-02-09 MED ORDER — MIDAZOLAM HCL 2 MG/2ML IJ SOLN
2.0000 mg | Freq: Once | INTRAMUSCULAR | Status: AC
Start: 2024-02-09 — End: 2024-02-09
  Administered 2024-02-09: 2 mg via INTRAVENOUS

## 2024-02-09 MED ORDER — PHENYLEPHRINE HCL-NACL 20-0.9 MG/250ML-% IV SOLN
INTRAVENOUS | Status: DC | PRN
Start: 2024-02-09 — End: 2024-02-09
  Administered 2024-02-09: 40 ug/min via INTRAVENOUS

## 2024-02-09 MED ORDER — ONDANSETRON HCL 4 MG/2ML IJ SOLN
INTRAMUSCULAR | Status: AC
Start: 2024-02-09 — End: 2024-02-09
  Filled 2024-02-09: qty 2

## 2024-02-09 MED ORDER — BUPIVACAINE-EPINEPHRINE (PF) 0.5% -1:200000 IJ SOLN
INTRAMUSCULAR | Status: DC | PRN
  Administered 2024-02-09: 17 mL via PERINEURAL

## 2024-02-09 MED ORDER — LIDOCAINE 2% (20 MG/ML) 5 ML SYRINGE
INTRAMUSCULAR | Status: DC | PRN
  Administered 2024-02-09: 60 mg via INTRAVENOUS

## 2024-02-09 MED ORDER — ASPIRIN 325 MG PO TBEC: 325.0000 mg | DELAYED_RELEASE_TABLET | Freq: Every day | ORAL | 0 refills | Status: AC

## 2024-02-09 MED ORDER — CEFAZOLIN SODIUM-DEXTROSE 2-4 GM/100ML-% IV SOLN
INTRAVENOUS | Status: AC
  Filled 2024-02-09: qty 100

## 2024-02-09 MED ORDER — BUPIVACAINE LIPOSOME 1.3 % IJ SUSP
INTRAMUSCULAR | Status: DC | PRN
  Administered 2024-02-09: 10 mL via PERINEURAL

## 2024-02-09 MED ORDER — FENTANYL CITRATE (PF) 100 MCG/2ML IJ SOLN
100.0000 ug | Freq: Once | INTRAMUSCULAR | Status: AC
  Administered 2024-02-09: 100 ug via INTRAVENOUS

## 2024-02-09 MED ORDER — KETOROLAC TROMETHAMINE 30 MG/ML IJ SOLN
INTRAMUSCULAR | Status: AC
  Filled 2024-02-09: qty 1

## 2024-02-09 MED ORDER — TRANEXAMIC ACID-NACL 1000-0.7 MG/100ML-% IV SOLN
1000.0000 mg | INTRAVENOUS | Status: AC
  Administered 2024-02-09: 1000 mg via INTRAVENOUS

## 2024-02-09 MED ORDER — ROCURONIUM BROMIDE 100 MG/10ML IV SOLN
INTRAVENOUS | Status: DC | PRN
  Administered 2024-02-09: 50 mg via INTRAVENOUS

## 2024-02-09 MED ORDER — ONDANSETRON HCL 4 MG/2ML IJ SOLN
INTRAMUSCULAR | Status: DC | PRN
  Administered 2024-02-09 (×2): 4 mg via INTRAVENOUS

## 2024-02-09 NOTE — Discharge Instructions (Addendum)
 Discharge Instructions    Attending Surgeon: Elspeth Parker, MD Office Phone Number: [phone]   Diagnosis and Procedures:    Surgeries Performed: Left shoulder subscapularis repair  Discharge Plan:    Diet: Resume usual diet. Begin with light or bland foods.  Drink plenty of fluids.  Activity:  Keep sling and dressing in place until your follow up visit in Physical Therapy You are advised to go home directly from the hospital or surgical center. Restrict your activities.  GENERAL INSTRUCTIONS: 1.  Keep your surgical site elevated above your heart for at least 5-7 days or longer to prevent swelling. This will improve your comfort and your overall recovery following surgery.     2. Please call Dr. Danetta office at [phone] with questions Monday-Friday during business hours. If no one answers, please leave a message and someone should get back to the patient within 24 hours. For emergencies please call 911 or proceed to the emergency room.   3. Patient to notify surgical team if experiences any of the following: Bowel/Bladder dysfunction, uncontrolled pain, nerve/muscle weakness, incision with increased drainage or redness, nausea/vomiting and Fever greater than 101.0 F.  Be alert for signs of infection including redness, streaking, odor, fever or chills. Be alert for excessive pain or bleeding and notify your surgeon immediately.  WOUND INSTRUCTIONS:   Leave your dressing/cast/splint in place until your post operative visit.  Keep it clean and dry.  Always keep the incision clean and dry until the staples/sutures are removed. If there is no drainage from the incision you should keep it open to air. If there is drainage from the incision you must keep it covered at all times until the drainage stops  Do not soak in a bath tub, hot tub, pool, lake or other body of water until 21 days after your surgery and your incision is completely dry and healed.  If you have  removable sutures (or staples) they must be removed 10-14 days (unless otherwise instructed) from the day of your surgery.     1)  Elevate the extremity as much as possible.  2)  Keep the dressing clean and dry.  3)  Please call us  if the dressing becomes wet or dirty.  4)  If you are experiencing worsening pain or worsening swelling, please call.     MEDICATIONS: Resume all previous home medications at the previous prescribed dose and frequency unless otherwise noted Start taking the  pain medications on an as-needed basis as prescribed  Please taper down pain medication over the next week following surgery.  Ideally you should not require a refill of any narcotic pain medication.  Take pain medication with food to minimize nausea. In addition to the prescribed pain medication, you may take over-the-counter pain relievers such as Tylenol .  Do NOT take additional tylenol  if your pain medication already has tylenol  in it.  Aspirin  325mg  daily per bottle instructions. Narcotic Policy: Per [facility] clinic policy, our goal is ensure optimal postoperative pain control with a multimodal pain management strategy. For all [facility] patients, our goal is to wean post-operative narcotic medications by 6 weeks post-operatively, and many times sooner. If this is not possible due to utilization of pain medication prior to surgery, your [facility] doctor will support your acute post-operative pain control for the first 6 weeks postoperatively, with a plan to transition you back to your primary pain team following that. Maralee will work to ensure a Therapist, occupational.  FOLLOWUP INSTRUCTIONS: 1. Follow up at the Physical Therapy Clinic 3-4 days following surgery. This appointment should be scheduled unless other arrangements have been made.The Physical Therapy scheduling number is [phone] if an appointment has not already been arranged.  2. Contact Dr. Danetta office during office hours at [phone] or the practice after hours line at [phone] for non-emergencies. For medical emergencies call 911.   Discharge Location: Home   Tylenol  can be taken after 1 pm if needed   Post Anesthesia Home Care Instructions  Activity: Get plenty of rest for the remainder of the day. A responsible individual must stay with you for 24 hours following the procedure.  For the next 24 hours, DO NOT: -Drive a car -Advertising copywriter -Drink alcoholic beverages -Take any medication unless instructed by your physician -Make any legal decisions or sign important papers.  Meals: Start with liquid foods such as gelatin or soup. Progress to regular foods as tolerated. Avoid greasy, spicy, heavy foods. If nausea and/or vomiting occur, drink only clear liquids until the nausea and/or vomiting subsides. Call your physician if vomiting continues.  Special Instructions/Symptoms: Your throat may feel dry or sore from the anesthesia or the breathing tube placed in your throat during surgery. If this causes discomfort, gargle with warm salt water. The discomfort should disappear within 24 hours.  If you had a scopolamine patch placed behind your ear for the management of post- operative nausea and/or vomiting:  1. The medication in the patch is effective for 72 hours, after which it should be removed.  Wrap patch in a tissue and discard in the trash. Wash hands thoroughly with soap and water. 2. You may remove the patch earlier than 72 hours if you experience unpleasant side effects which may include dry mouth, dizziness or visual disturbances. 3. Avoid touching the patch. Wash your hands with soap and water after contact with the patch.  Regional Anesthesia Blocks  1. You may not be able to move or feel the blocked extremity after a regional anesthetic block. This may last may last from 3-48 hours after placement, but it will go away. The length of time depends on the medication injected and your  individual response to the medication. As the nerves start to wake up, you may experience tingling as the movement and feeling returns to your extremity. If the numbness and inability to move your extremity has not gone away after 48 hours, please call your surgeon.   2. The extremity that is blocked will need to be protected until the numbness is gone and the strength has returned. Because you cannot feel it, you will need to take extra care to avoid injury. Because it may be weak, you may have difficulty moving it or using it. You may not know what position it is in without looking at it while the block is in effect.  3. For blocks in the legs and feet, returning to weight bearing and walking needs to be done carefully. You will need to wait until the numbness is entirely gone and the strength has returned. You should be able to move your leg and foot normally before you try and bear weight or walk. You will need someone to be with you when you first try to ensure you do not fall and possibly risk injury.  4. Bruising and tenderness at the needle site are common side effects and will resolve in a few days.  5. Persistent numbness or new problems with movement should  be communicated to the surgeon or the [facility] [phone] [facility] [phone]).  Information for Discharge Teaching: EXPAREL  (bupivacaine  liposome injectable suspension)   Pain relief is important to your recovery. The goal is to control your pain so you can move easier and return to your normal activities as soon as possible after your procedure. Your physician may use several types of medicines to manage pain, swelling, and more.  Your surgeon or anesthesiologist gave you EXPAREL (bupivacaine ) to help control your pain after surgery.  EXPAREL  is a local anesthetic designed to release slowly over an extended period of time to provide pain relief by numbing the tissue around the surgical  site. EXPAREL  is designed to release pain medication over time and can control pain for up to 72 hours. Depending on how you respond to EXPAREL , you may require less pain medication during your recovery. EXPAREL  can help reduce or eliminate the need for opioids during the first few days after surgery when pain relief is needed the most. EXPAREL  is not an opioid and is not addictive. It does not cause sleepiness or sedation.   Important! A teal colored band has been placed on your arm with the date, time and amount of EXPAREL  you have received. Please leave this armband in place for the full 96 hours following administration, and then you may remove the band. If you return to the hospital for any reason within 96 hours following the administration of EXPAREL , the armband provides important information that your health care providers to know, and alerts them that you have received this anesthetic.    Possible side effects of EXPAREL : Temporary loss of sensation or ability to move in the area where medication was injected. Nausea, vomiting, constipation Rarely, numbness and tingling in your mouth or lips, lightheadedness, or anxiety may occur. Call your doctor right away if you think you may be experiencing any of these sensations, or if you have other questions regarding possible side effects.  Follow all other discharge instructions given to you by your surgeon or nurse. Eat a healthy diet and drink plenty of water or other fluids.

## 2024-02-09 NOTE — Telephone Encounter (Signed)
 Patient has questions about his after surgery instruction as far a his meds  and his sling patient  also wants a different sling. Best contact [phone]

## 2024-02-09 NOTE — Transfer of Care (Signed)
 Immediate Anesthesia Transfer of Care Note  Patient: Kent Johnson  Procedure(s) Performed: LEFT SHOULDER OPEN SUBSCAPULARIS REPAIR (Left) RIGHT PECTORAL BICEPS TENODESIS / POSSIBLE DERMAL AUGMENTATION (Left) ARTHROSCOPY, SHOULDER (Left: Shoulder)  Patient Location: PACU  Anesthesia Type:GA combined with regional for post-op pain  Level of Consciousness: awake, alert , oriented, and patient cooperative  Airway & Oxygen Therapy: Patient Spontanous Breathing and Patient connected to face mask oxygen  Post-op Assessment: Report given to RN and Post -op Vital signs reviewed and stable  Post vital signs: Reviewed and stable  Last Vitals:  Vitals Value Taken Time  BP 115/78 02/09/24 10:38  Temp 36.2 C 02/09/24 10:38  Pulse 79 02/09/24 10:41  Resp 16 02/09/24 10:41  SpO2 97 % 02/09/24 10:41  Vitals shown include unfiled device data.  Last Pain:  Vitals:   02/09/24 0714  TempSrc: Temporal  PainSc: 0-No pain      Patients Stated Pain Goal: 3 (02/09/24 0714)  Complications: No notable events documented.

## 2024-02-09 NOTE — Progress Notes (Signed)
Assisted Dr. Moser with left, interscalene , ultrasound guided block. Side rails up, monitors on throughout procedure. See vital signs in flow sheet. Tolerated Procedure well. 

## 2024-02-09 NOTE — Op Note (Signed)
 Date of Surgery: 02/09/2024  INDICATIONS: Kent Johnson is a 38 y.o.-year-old male with left subscapular nonicteric.  The risk and benefits of the procedure were discussed in detail and documented in the pre-operative evaluation.   PREOPERATIVE DIAGNOSIS: 1.  Left shoulder subscapularis tear  POSTOPERATIVE DIAGNOSIS: Same.  PROCEDURE: 1.  Left shoulder revision subscapularis repair with collagen with diagnostic arthroscopy  SURGEON: Elspeth LITTIE Parker MD  ASSISTANT: Conley Dawson, ATC  ANESTHESIA:  general scalene nerve block  IV FLUIDS AND URINE: See anesthesia record.  ANTIBIOTICS: Ancef   ESTIMATED BLOOD LOSS: 10 mL.  IMPLANTS:  Implant Name Type Inv. Item Serial No. Manufacturer Lot No. LRB No. Used Action  ANCHOR SVLK SP PEEK 4.75BLUE#2 - [id number] Anchor ANCHOR SVLK SP PEEK 4.75BLUE#2  [employer] [id number] Left 1 Implanted  TISSUE ARTHROFLEX THICK 4 - [id number] Tissue TISSUE ARTHROFLEX THICK 4 [id number] [facility]  Left 1 Implanted  ANCHOR FIBERTAK 2.6X1.7 BLUE - [id number] Anchor ANCHOR FIBERTAK 2.6X1.7 BLUE  [employer] [id number] Left 1 Implanted    DRAINS: None  CULTURES: None  COMPLICATIONS: none  DESCRIPTION OF PROCEDURE:    Arthroscopic findings demonstrated:  Glenoid cartilage: Normal Humeral head: Bony defect anterior humerus Labrum: Intact Biceps insertion: Not visualized Biceps tendon: Not visualized Subscapularis insertion: Full thickness chronic tear Rotator cuff: Intact Examination under anesthesia revealed forward elevation of 165 degrees.  In abduction, there was 90 degrees of external rotation and 70 degrees of internal rotation.  With the arm at the side, there was 70 degrees of external rotation.  There is a 1+ anterior load shift and a 2+ posterior load shift with palpable click as the humerus moved over the glenoid.  Arthroscopic findings demonstrated:  Glenoid cartilage: Normal Humeral head: Normal Labrum:  labral tear from 6  o'clock to 10 o'clock Biceps insertion: Intact Biceps tendon: Intact Subscapularis insertion: Normal Rotator cuff: Normal  The patient was identified in the preoperative holding area.  The correct site was marked according to universal protocol.  Anesthesia performed an interscalene nerve block.  Ancef  was given 1 hour prior to skin incision.  The patient was subsequently taken back to the operating room.  The patient was prepped and draped and positioned in the lateral position.  All bony prominences were padded.  Final timeout was performed.  Standard posterior, anterior and anterosuperolateral portals were utilized. The posterior portal was created with an 11-blade and the arthroscope introduced into the glenohumeral joint.  A full diagnostic arthroscopy was performed as described above.   [name] was introduced anterior portal and the remaining sutures from the previous subscapularis pair were identified and these had pulled out.  These were debrided with a shaver.  Time attention was turned to the an open anterior approach.  A mid axillary incision was made in line with the axillary fold.  15 blade was used to incise through skin.  The cephalic vein was identified and this was retracted safely out of the way.  [name] was used over the coracoid.  The subscapularis was identified and the proximal border had been chronically torn.  This was released from its insertion completely at the proximal third border.  The large humeral head defect was identified anteriorly.  Rondure was used to create bleeding into this space.  At this time a double row repair was performed with a medial row all suture anchor.  4 limbs of this were brought up into the subscapularis and then brought into the lateral margin of the biceps tuberosity with a  swivel lock anchor.  This had excellent return to normal anatomic footprint.  Given the chronicity of the tear decision was made to augment with collagen patch.  This was placed  over the native subscapularis insertion and placed with 0 Vicryl's in the corners of each to stabilize.   All instruments were removed, fluid was evacuated, and the arthroscopy portals were closed with 3-0 nylon.  A sterile dressing was applied with Xeroform, gauze, ABD and Medipore tape followed by a Iceman device and a sling with an abduction pillow.    The patient awoke from anesthesia without difficulty and was transferred to PACU in stable condition.       POSTOPERATIVE PLAN: He will be allowed early active range of motion in forward flexion.  His only restriction will be no active internal rotation behind the back.  I will plan to see him back in 2 weeks for suture removal.  He will be placed on aspirin  for 2 weeks for blood clot prevention  Elspeth LITTIE Parker, MD 10:37 AM

## 2024-02-09 NOTE — Brief Op Note (Signed)
   Brief Op Note  Date of Surgery: 02/09/2024  Preoperative Diagnosis: LEFT AVULSION OF SUBSCAPULARIS  Postoperative Diagnosis: same  Procedure: Procedure(s): LEFT SHOULDER OPEN SUBSCAPULARIS REPAIR RIGHT PECTORAL BICEPS TENODESIS / POSSIBLE DERMAL AUGMENTATION ARTHROSCOPY, SHOULDER  Implants: Implant Name Type Inv. Item Serial No. Manufacturer Lot No. LRB No. Used Action  ANCHOR SVLK SP PEEK 4.75BLUE#2 - [id number] Anchor ANCHOR SVLK SP PEEK 4.75BLUE#2  [employer] [id number] Left 1 Implanted  TISSUE ARTHROFLEX THICK 4 - [id number] Tissue TISSUE ARTHROFLEX THICK 4 [id number] [facility]  Left 1 Implanted  ANCHOR FIBERTAK 2.6X1.7 Gans - [id number] Anchor ANCHOR FIBERTAK 2.6X1.7 [employer] [id number] Left 1 Implanted    Surgeons: Surgeon(s): Genelle Standing, MD  Anesthesia: General    Estimated Blood Loss: See anesthesia record  Complications: None  Condition to PACU: Stable  Standing LITTIE Genelle, MD 02/09/2024 10:37 AM

## 2024-02-09 NOTE — Anesthesia Postprocedure Evaluation (Signed)
 Anesthesia Post Note  Patient: Kent Johnson  Procedure(s) Performed: LEFT SHOULDER OPEN SUBSCAPULARIS REPAIR (Left) RIGHT PECTORAL BICEPS TENODESIS / POSSIBLE DERMAL AUGMENTATION (Left) ARTHROSCOPY, SHOULDER (Left: Shoulder)     Patient location during evaluation: PACU Anesthesia Type: General and Regional Level of consciousness: awake and alert Pain management: pain level controlled Vital Signs Assessment: post-procedure vital signs reviewed and stable Respiratory status: spontaneous breathing, nonlabored ventilation and respiratory function stable Cardiovascular status: blood pressure returned to baseline and stable Postop Assessment: no apparent nausea or vomiting Anesthetic complications: no   No notable events documented.  Last Vitals:  Vitals:   02/09/24 1100 02/09/24 1144  BP: 116/71 111/66  Pulse: 69 86  Resp: 15 16  Temp:  36.6 C  SpO2: 94% 96%    Last Pain:  Vitals:   02/09/24 1144  TempSrc: Temporal  PainSc: 0-No pain                 Timi Reeser

## 2024-02-09 NOTE — Anesthesia Preprocedure Evaluation (Addendum)
 Anesthesia Evaluation  Patient identified by MRN, date of birth, ID band Patient awake    Reviewed: Allergy & Precautions, NPO status , Patient's Chart, lab work & pertinent test results  History of Anesthesia Complications Negative for: history of anesthetic complications  Airway Mallampati: I  TM Distance: >3 FB Neck ROM: Full    Dental  (+) Teeth Intact, Dental Advisory Given   Pulmonary neg shortness of breath, neg sleep apnea, neg COPD, neg recent URI, former smoker   breath sounds clear to auscultation       Cardiovascular negative cardio ROS  Rhythm:Regular     Neuro/Psych  PSYCHIATRIC DISORDERS Anxiety     negative neurological ROS     GI/Hepatic negative GI ROS, Neg liver ROS,,,  Endo/Other  negative endocrine ROS    Renal/GU negative Renal ROS     Musculoskeletal   Abdominal   Peds  Hematology negative hematology ROS (+)   Anesthesia Other Findings   Reproductive/Obstetrics                              Anesthesia Physical Anesthesia Plan  ASA: 1  Anesthesia Plan: General   Post-op Pain Management: Regional block*   Induction: Intravenous  PONV Risk Score and Plan: 2 and Ondansetron  and Dexamethasone   Airway Management Planned: Oral ETT  Additional Equipment: None  Intra-op Plan:   Post-operative Plan: Extubation in OR  Informed Consent: I have reviewed the patients History and Physical, chart, labs and discussed the procedure including the risks, benefits and alternatives for the proposed anesthesia with the patient or authorized representative who has indicated his/her understanding and acceptance.     Dental advisory given  Plan Discussed with: CRNA  Anesthesia Plan Comments:          Anesthesia Quick Evaluation

## 2024-02-09 NOTE — H&P (Signed)
 Post Operative Evaluation      Procedure/Date of Surgery: Left shoulder removal of loose body and subscapularis repair 03/11/22   Interval History:    05/22/2023: Presents today for follow-up of his left shoulder.  He is here today for MRI discussion.  Having persistent popping and clicking in the left shoulder   Presents today 1 year out from the above procedure.  Overall he is doing generally well although he is still having persistent limited function when he is being very active.  He feels like the shoulder is somewhat disconnected or not fully strong.  There is some tenderness about the groove of the biceps as well.   PMH/PSH/Family History/Social History/Meds/Allergies:         Past Medical History:  Diagnosis Date   [place]   Arthritis 2021    rt knee   Routine general medical examination at a health care facility 04/08/2023               Past Surgical History:  Procedure Laterality Date   CLAVICLE SURGERY   2011    broke collar bone skiing   COLON SURGERY   around 2007    benign lipoma inside of colon lead to intusseption   LIPOMA EXCISION   2007    benign lipoma in colon, caused intersepcion   SHOULDER ARTHROSCOPY WITH DEBRIDEMENT AND BICEP TENDON REPAIR Left 03/11/2022    Procedure: LEFT SHOULDER ARTHROSCOPY WITH MINI OPEN SUBSCAPULARIS REPAIR  AND BICEP TENDODESIS;  Surgeon: Genelle Standing, MD;  Location: [facility];  Service: Orthopedics;  Laterality: Left;   WOUND EXPLORATION N/A 05/23/2022    Procedure: WOUND EXPLORATION WITH REMOVAL OF FOREIGN BODY OF ABDOMINAL WALL;  Surgeon: Eletha Boas, MD;  Location: [facility];  Service: General;  Laterality: N/A;        Social History         Socioeconomic History   Marital status: Married      Spouse name: Vernell   Number of children: Not on file   Years of education: Not on file   Highest education level: Master's degree (e.g., MA, MS, MEng, MEd, MSW,  MBA)  Occupational History   Not on file  Tobacco Use   Smoking status: Some Days      Types: Cigarettes   Smokeless tobacco: Never   Tobacco comments:      2 cig per year.  Vaping Use   Vaping status: Not on file  Substance and Sexual Activity   Alcohol use: Yes      Comment: occas   Drug use: Never   Sexual activity: Yes      Partners: Female  Other Topics Concern   Not on file  Social History Narrative   Not on file    Social Determinants of Health        Financial Resource Strain: Low Risk  (04/04/2023)    Overall Financial Resource Strain (CARDIA)     Difficulty of Paying Living Expenses: Not hard at all  Food Insecurity: No Food Insecurity (04/04/2023)    Hunger Vital Sign     Worried About Running Out of Food in the Last Year: Never true     Ran Out of Food in the Last Year: Never true  Transportation Needs: No Transportation Needs (04/04/2023)    PRAPARE - Therapist, art (Medical): No     Lack of Transportation (Non-Medical): No  Physical Activity: Sufficiently Active (04/04/2023)    Exercise Vital Sign     Days of Exercise per Week: 5 days     Minutes of Exercise per Session: 30 min  Stress: No Stress Concern Present (04/04/2023)    [employer] of Occupational Health - Occupational Stress Questionnaire     Feeling of Stress : Only a little  Social Connections: Moderately Integrated (04/04/2023)    Social Connection and Isolation Panel [NHANES]     Frequency of Communication with Friends and Family: Once a week     Frequency of Social Gatherings with Friends and Family: Once a week     Attends Religious Services: 1 to 4 times per year     Active Member of [employer] or Organizations: Yes     Attends Engineer, structural: More than 4 times per year     Marital Status: Married         Family History  Problem Relation Age of Onset   Anxiety disorder Mother     Cancer Mother          melanoma   Miscarriages / Stillbirths  Mother     Asthma Brother     Arthritis Maternal Grandmother     Cancer Maternal Grandfather     Heart attack Maternal Grandfather          Allergies  No Known Allergies         Current Outpatient Medications  Medication Sig Dispense Refill   ascorbic acid (VITAMIN C) 500 MG tablet Take 500 mg by mouth daily.       Cholecalciferol (VITAMIN D3) 125 MCG (5000 UT) TABS Take 5,000 Units by mouth daily.       cyanocobalamin (VITAMIN B12) 1000 MCG tablet Take 1,000 mcg by mouth daily.       EPINEPHrine  0.3 mg/0.3 mL IJ SOAJ injection Inject 0.3 mg into the muscle as needed for anaphylaxis. 1 each 1   escitalopram  (LEXAPRO ) 10 MG tablet TAKE 1 TABLET(10 MG) BY MOUTH DAILY 90 tablet 2   fexofenadine (ALLEGRA) 180 MG tablet Take 180 mg by mouth daily as needed for allergies.          No current facility-administered medications for this visit.      Imaging Results (Last 48 hours)  No results found.     Review of Systems:   A ROS was performed including pertinent positives and negatives as documented in the HPI.     Musculoskeletal Exam:     There were no vitals taken for this visit.   Left shoulder portals are healed.  Able to flex and extend at the left elbow.  Active forward elevation of the left shoulder is 170 degrees equal to the contralateral side, external rotation actively is to 70 degrees equal to the contralateral side.  Internal rotation is to T12 bilaterally.  Distal neurosensory exam is intact.  There is some weakness on the left compared to the contralateral side with forward elevation   Imaging:     MRI left shoulder: There is evidence of a large anterior chondral defect where his previous loose body emanated from.  There is evidence of a full-thickness subscapularis tear.  Supraspinatus is thin but intact   I personally reviewed and interpreted the radiographs.     Assessment:   57-month status post left arthroscopic removal of loose body subscapularis repair  with biceps tendon repair.  At today's visit I did describe that he does have evidence of a  large osteochondral lesion anteriorly where his donor site likely emanated from anteriorly.  I described that this is now much clearer and is likely engaging as he goes through motion and overhead activity.  I did describe as well that unfortunately I do see evidence of a subscapularis tear on his MRI.  Given the large size of his osteochondral defect we did discuss treatment options.  At this time I do believe he would ultimately benefit from an open subscapularis repair with a possible dermal patch augmentation.  I did discuss that I would also recommend revising his tenodesis to a subpectoral tenodesis at bedtime.  Given the fact that he would require a subscapularis repair I do ultimately believe he may benefit from a modified McLaughlin procedure where the subscapularis was repaired into the bed of his large osteochondral lesion.  We did discuss the alternatives including a large osteochondral graft although after discussion of all the options he would be fine with improvement in the clicking and popping and is willing to sacrifice external rotation for this.  After discussions of risks and benefits as well as limitations he has elected for this Plan :     -Plan for left shoulder open subscapularis repair, pectoral biceps tenodesis, possible dermal augmentation       After a lengthy discussion of treatment options, including risks, benefits, alternatives, complications of surgical and nonsurgical conservative options, the patient elected surgical repair.    The patient  is aware of the material risks  and complications including, but not limited to injury to adjacent structures, neurovascular injury, infection, numbness, bleeding, implant failure, thermal burns, stiffness, persistent pain, failure to heal, disease transmission from allograft, need for further surgery, dislocation, anesthetic risks, blood clots,  risks of death,and others. The probabilities of surgical success and failure discussed with patient given their particular co-morbidities.The time and nature of expected rehabilitation and recovery was discussed.The patient's questions were all answered preoperatively.  No barriers to understanding were noted. I explained the natural history of the disease process and Rx rationale.  I explained to the patient what I considered to be reasonable expectations given their personal situation.  The final treatment plan was arrived at through a shared patient decision making process model.     I personally saw and evaluated the patient, and participated in the management and treatment plan.   Elspeth Parker, MD Attending Physician, Orthopedic Surgery   This document was dictated using Dragon voice recognition software. A reasonable attempt at proof reading has been made to minimize errors.

## 2024-02-09 NOTE — Telephone Encounter (Signed)
Attempted call back. Will try again later.

## 2024-02-09 NOTE — Anesthesia Procedure Notes (Signed)
 Procedure Name: Intubation Date/Time: 02/09/2024 9:05 AM  Performed by: Denton Niels CROME, CRNAPre-anesthesia Checklist: Patient identified, Emergency Drugs available, Suction available and Patient being monitored Patient Re-evaluated:Patient Re-evaluated prior to induction Oxygen Delivery Method: Circle system utilized Preoxygenation: Pre-oxygenation with 100% oxygen Induction Type: IV induction Ventilation: Mask ventilation without difficulty Laryngoscope Size: Mac and 3 Grade View: Grade I Tube type: Oral Tube size: 7.0 mm Number of attempts: 1 Airway Equipment and Method: Stylet and Oral airway Placement Confirmation: ETT inserted through vocal cords under direct vision, positive ETCO2 and breath sounds checked- equal and bilateral Secured at: 23 cm Tube secured with: Tape Dental Injury: Teeth and Oropharynx as per pre-operative assessment

## 2024-02-09 NOTE — Anesthesia Procedure Notes (Signed)
 Anesthesia Regional Block: Interscalene brachial plexus block   Pre-Anesthetic Checklist: , timeout performed,  Correct Patient, Correct Site, Correct Laterality,  Correct Procedure, Correct Position, site marked,  Risks and benefits discussed,  Surgical consent,  Pre-op evaluation,  At surgeon's request and post-op pain management  Laterality: Left and Upper  Prep: chloraprep       Needles:  Injection technique: Single-shot      Needle Length: 5cm  Needle Gauge: 22     Additional Needles: Arrow StimuQuik ECHO Echogenic Stimulating PNB Needle  Procedures:,,,, ultrasound used (permanent image in chart),,    Narrative:  Start time: 02/09/2024 8:22 AM End time: 02/09/2024 8:27 AM Injection made incrementally with aspirations every 5 mL.  Performed by: Personally  Anesthesiologist: Leopoldo Bruckner, MD

## 2024-02-10 ENCOUNTER — Encounter (HOSPITAL_BASED_OUTPATIENT_CLINIC_OR_DEPARTMENT_OTHER): Payer: Self-pay | Admitting: Orthopaedic Surgery

## 2024-02-10 NOTE — Telephone Encounter (Signed)
 Responded via [employer]

## 2024-02-11 NOTE — Therapy (Signed)
 OUTPATIENT PHYSICAL THERAPY SHOULDER EVALUATION   Patient Name: Kent Johnson MRN: [id number] DOB:1985-10-27, 38 y.o., male Today's Date: 02/12/2024  END OF SESSION:  PT End of Session - 02/12/24 0900     Visit Number 1    Number of Visits 24    Date for PT Re-Evaluation 05/06/24    Authorization Type [employer] Auth needed    Authorization - Visit Number 1    Authorization - Number of Visits 24    Progress Note Due on Visit 24    PT Start Time 0802    PT Stop Time 0852    PT Time Calculation (min) 50 min    Activity Tolerance Patient tolerated treatment well;No increased pain    Behavior During Therapy [facility] for tasks assessed/performed          Past Medical History:  Diagnosis Date   [place]   [place]    Arthritis 2021   rt knee   Routine general medical examination at a health care facility 04/08/2023   Past Surgical History:  Procedure Laterality Date   BICEPT TENODESIS Left 02/09/2024   Procedure: RIGHT PECTORAL BICEPS TENODESIS / POSSIBLE DERMAL AUGMENTATION;  Surgeon: Genelle Standing, MD;  Location: [facility];  Service: Orthopedics;  Laterality: Left;   CLAVICLE SURGERY  2011   broke collar bone skiing   COLON SURGERY  around 2007   benign lipoma inside of colon lead to intusseption   LIPOMA EXCISION  2007   benign lipoma in colon, caused intersepcion   OPEN SUBSCAPULARIS REPAIR Left 02/09/2024   Procedure: LEFT SHOULDER OPEN SUBSCAPULARIS REPAIR;  Surgeon: Genelle Standing, MD;  Location: [facility];  Service: Orthopedics;  Laterality: Left;   SHOULDER ARTHROSCOPY Left 02/09/2024   Procedure: ARTHROSCOPY, SHOULDER;  Surgeon: Genelle Standing, MD;  Location: [facility];  Service: Orthopedics;  Laterality: Left;   SHOULDER ARTHROSCOPY WITH DEBRIDEMENT AND BICEP TENDON REPAIR Left 03/11/2022   Procedure: LEFT SHOULDER ARTHROSCOPY WITH MINI OPEN SUBSCAPULARIS REPAIR  AND BICEP TENDODESIS;  Surgeon:  Genelle Standing, MD;  Location: [facility];  Service: Orthopedics;  Laterality: Left;   WOUND EXPLORATION N/A 05/23/2022   Procedure: WOUND EXPLORATION WITH REMOVAL OF FOREIGN BODY OF ABDOMINAL WALL;  Surgeon: Eletha Boas, MD;  Location: [facility];  Service: General;  Laterality: N/A;   Patient Active Problem List   Diagnosis Date Noted   Routine general medical examination at a health care facility 04/08/2023   Foreign body in anterior abdominal wall 05/18/2022   Avulsion of left subscapularis    Anxiety disorder 11/11/2021   Insomnia 11/11/2021    PCP: [facility]  REFERRING PROVIDER: Standing Genelle, MD  REFERRING DIAG: S46.892A (ICD-10-CM) - Avulsion of left subscapularis, initial encounter  THERAPY DIAG:  Muscle weakness (generalized) - Plan: PT plan of care cert/re-cert  Stiffness of left shoulder, not elsewhere classified - Plan: PT plan of care cert/re-cert  Acute pain of left shoulder - Plan: PT plan of care cert/re-cert  Localized edema - Plan: PT plan of care cert/re-cert  Rationale for Evaluation and Treatment: Rehabilitation  ONSET DATE: 02/09/2024  SUBJECTIVE:  SUBJECTIVE STATEMENT: [name] had a rotator cuff (subscapularis) repair in 2023.  He re-tore it this year with surgery 72 hours ago.  [name] his pain has been much better this time around as he feels like the nerve block is still kicking in.  He has not required any prescription pain medication to this point.  Hand dominance: Left  PERTINENT HISTORY:  Left biceps tenodesis, left subscapularis repair 2023, left clavicle fracture 2011  PAIN:  Are you having pain? Yes: NPRS scale: 0-2/10 the past 72 hours Pain location: Lt shoulder anterior Pain description: Slight ache Aggravating factors:  NA Relieving factors: NA  PRECAUTIONS: Shoulder-revision.  This is [name]'s second subscapularis repair on the left side so we will need to be conservative with starting strengthening given his revision status.  Recommend holding off until the end of week 10 post-surgery before beginning any resisted strength work.  RED FLAGS: None   WEIGHT BEARING RESTRICTIONS: Yes No left upper extremity weight-bearing  FALLS:  Has patient fallen in last 6 months? No  LIVING ENVIRONMENT: Lives with: lives with their family, lives with their spouse, and with 3 children Lives in: House/apartment Stairs: No problems Has following equipment at home: Lt shoulder brace  OCCUPATION: Sport and exercise psychologist, power infrastructure  PLOF: Independent  PATIENT GOALS:Return to swimming, yoga and ADLs without restrictions  NEXT MD VISIT: August 6  OBJECTIVE:   Note: Objective measures were completed at Evaluation unless otherwise noted.  DIAGNOSTIC FINDINGS:  1. Prior rotator cuff repair. Moderate supraspinatus tendinosis. 2. High-grade partial, near complete, tear of the subscapularis tendon. 3. No labral tear.  PATIENT SURVEYS:  PSFS: THE PATIENT SPECIFIC FUNCTIONAL SCALE  Place score of 0-10 (0 = unable to perform activity and 10 = able to perform activity at the same level as before injury or problem)  Activity Date: 02/12/2024    Swimming 1    2.  Lifting the arm straight out 1    3.  Shoulder press at the gym 0    4.  Yoga 3    Total Score 1.25      Total Score = Sum of activity scores/number of activities  Minimally Detectable Change: 3 points (for single activity); 2 points (for average score)  [name] (nd). The Patient Specific Functional Scale . Retrieved from [email or web address]   COGNITION: Overall cognitive status: Within functional limits for tasks assessed     SENSATION: WFL  POSTURE: No significant postural  issues noted  UPPER EXTREMITY ROM:   Passive ROM Left/Right 02/12/2024   Shoulder flexion 105/175   Shoulder extension    Shoulder abduction    Shoulder horizontal adduction 0/35   Shoulder internal rotation 50/70   Shoulder external rotation 30/105   Elbow flexion    Elbow extension    Wrist flexion    Wrist extension    Wrist ulnar deviation    Wrist radial deviation    Wrist pronation    Wrist supination    (Blank rows = not tested)  UPPER EXTREMITY STRENGTH: Deferred due to being 72 hours post-surgery  Assessed in pounds with hand-held dynamometer Left/Right   Shoulder flexion    Shoulder extension    Shoulder abduction    Shoulder adduction    Shoulder internal rotation    Shoulder external rotation    Middle trapezius    Lower trapezius    Elbow flexion    Elbow extension    Wrist flexion    Wrist extension    Wrist  ulnar deviation    Wrist radial deviation    Wrist pronation    Wrist supination    Grip strength (lbs)    (Blank rows = not tested)                                                                                              TREATMENT DATE: 02/12/2024 Codman's/pendulums: Forward and back; side-to-side; clockwise and counterclockwise 20 times each.  Emphasis on letting the arm relax in the body control of motion with cues to try to get his chest as parallel to the floor as possible as this activity gets more comfortable. Scapular retraction/shoulder blade pinch 10 x 5 seconds.  Avoid elbow extension to minimize strain on the subscapularis repair.  [id number]: Discussed post-op note; today's examination findings; activities to avoid (particularly pressing up out of a chair and anything that involves extension, external rotation or elbow/hand behind the back); discussed recommendations to go slower with strength progressions given his revision status and a recommended 12-week course of rehabilitation   PATIENT EDUCATION: Education details: See  above Person educated: Patient Education method: Explanation, Demonstration, Tactile cues, Verbal cues, and Handouts Education comprehension: verbalized understanding, returned demonstration, verbal cues required, tactile cues required, and needs further education  HOME EXERCISE PROGRAM: Access Code: [email or web address] URL: [email or web address] Date: 02/12/2024 Prepared by: Lamar Ivory  Exercises - Pendulums  - 3-5 x daily - 7 x weekly - 1 sets - 20-30 reps - Standing Scapular Retraction  - 5 x daily - 7 x weekly - 1 sets - 5 reps - 5 second hold  ASSESSMENT:  CLINICAL IMPRESSION: Patient is a 38 y.o. male who was seen today for physical therapy evaluation and treatment for S46.892A (ICD-10-CM) - Avulsion of left subscapularis, initial encounter.  This is [name]'s second subscapularis repair in less than 2 years.  He is doing quite well with his pain management at this point but has the expected limitations in his passive range of motion.  [name] and I discussed at length postsurgical precautions including avoiding using the left hand to push up out of a chair and to avoid anything with elbow or hand extension, external rotation or behind the back.  I recommend he follow a fairly conservative protocol given his revision status, even at his young age with likely healthy tissue as the literature suggest scar tissue is certainly not as strong as normal healthy tissue I would anticipate with his second surgery he is going to have quite a bit of scar tissue.  [name] is an appropriate rehabilitation candidate and should meet the below listed goals.  OBJECTIVE IMPAIRMENTS: decreased activity tolerance, decreased endurance, decreased knowledge of condition, decreased ROM, decreased strength, decreased safety awareness, increased edema, impaired perceived functional ability, impaired UE functional use, and pain.   ACTIVITY LIMITATIONS: carrying, lifting, dressing, and reach over  head  PARTICIPATION LIMITATIONS: cleaning, driving, community activity, occupation, and yard work  PERSONAL FACTORS: Left biceps tenodesis, left subscapularis repair 2023, left clavicle fracture 2011 are also affecting patient's functional outcome.   REHAB POTENTIAL: Good  CLINICAL DECISION MAKING: Evolving/moderate complexity  EVALUATION COMPLEXITY: Moderate  GOALS: Goals reviewed with patient? Yes  SHORT TERM GOALS: Target date: 03/25/2024  [name] will be independent with his day 1 home exercise program Baseline: Started 02/12/2024 Goal status: INITIAL  2.  Improve left shoulder passive range of motion for flexion to 135 degrees; internal rotation to 60 degrees; external rotation to 30 degrees and horizontal adduction to 30 degrees Baseline: 105; 50; 30 and 0 respectively Goal status: INITIAL   LONG TERM GOALS: Target date: 05/06/2024  Improve patient specific functional score to at least 6 Baseline: 1.25 Goal status: INITIAL  2.  [name] will be able to maintain left shoulder pain at a level of 2/10 or better on the visual analog scale Baseline: 2/10 72 hours post-surgery Goal status: INITIAL  3.  Improve left shoulder active range of motion to 90% or better of the uninvolved right Baseline: See objective Goal status: INITIAL  4.  Improve left shoulder strength for internal/external rotation to 60% of the uninvolved right at 12 weeks postsurgery (9 months to a year expected for 90% plus strength after the revision) Baseline: Deferred secondary to surgery 72 hours ago Goal status: INITIAL  5.  [name] will be independent with his long-term maintenance home exercise program at discharge Baseline: Started 02/12/2024 Goal status: INITIAL   PLAN:  PT FREQUENCY: 2x/week  PT DURATION: 12 weeks  PLANNED INTERVENTIONS: 97750- Physical Performance Testing, 97110-Therapeutic exercises, 97530- Therapeutic activity, 97112- Neuromuscular re-education, 97535- Self Care, [id number]-  Manual therapy, [id number]- Electrical stimulation (unattended), 97016- Vasopneumatic device, Patient/Family education, Joint mobilization, and Cryotherapy  PLAN FOR NEXT SESSION: Passive range of motion with limitations of 30 degrees of external rotation and avoiding extension and behind the back function for the first 4 weeks.  Consider progression into active assisted and active range of motion weeks 5 through 10.  Transition into resisted strengthening week 10-12 post-surgery with expected transfer into independent rehabilitation at the end of week 12.  Keep in mind this is a revision and a more conservative protocol in regards to strength progression is recommended.   Myer LELON Ivory, PT, MPT 02/12/2024, 9:21 AM

## 2024-02-12 ENCOUNTER — Other Ambulatory Visit: Payer: Self-pay | Admitting: Orthopedic Surgery

## 2024-02-12 ENCOUNTER — Ambulatory Visit (HOSPITAL_BASED_OUTPATIENT_CLINIC_OR_DEPARTMENT_OTHER): Admitting: Physical Therapy

## 2024-02-12 ENCOUNTER — Encounter: Payer: Self-pay | Admitting: Rehabilitative and Restorative Service Providers"

## 2024-02-12 ENCOUNTER — Ambulatory Visit (INDEPENDENT_AMBULATORY_CARE_PROVIDER_SITE_OTHER): Admitting: Rehabilitative and Restorative Service Providers"

## 2024-02-12 ENCOUNTER — Encounter (HOSPITAL_BASED_OUTPATIENT_CLINIC_OR_DEPARTMENT_OTHER): Payer: Self-pay | Admitting: Orthopaedic Surgery

## 2024-02-12 DIAGNOSIS — M25512 Pain in left shoulder: Secondary | ICD-10-CM | POA: Diagnosis not present

## 2024-02-12 DIAGNOSIS — S86912D Strain of unspecified muscle(s) and tendon(s) at lower leg level, left leg, subsequent encounter: Secondary | ICD-10-CM

## 2024-02-12 DIAGNOSIS — R600 Localized edema: Secondary | ICD-10-CM

## 2024-02-12 DIAGNOSIS — M6281 Muscle weakness (generalized): Secondary | ICD-10-CM | POA: Diagnosis not present

## 2024-02-12 DIAGNOSIS — S86812A Strain of other muscle(s) and tendon(s) at lower leg level, left leg, initial encounter: Secondary | ICD-10-CM

## 2024-02-12 DIAGNOSIS — M25612 Stiffness of left shoulder, not elsewhere classified: Secondary | ICD-10-CM

## 2024-02-12 NOTE — Telephone Encounter (Signed)
 Spoke with patient.coming in today for dressing change

## 2024-02-17 ENCOUNTER — Encounter (HOSPITAL_BASED_OUTPATIENT_CLINIC_OR_DEPARTMENT_OTHER): Payer: Self-pay | Admitting: Orthopaedic Surgery

## 2024-02-18 ENCOUNTER — Encounter (HOSPITAL_BASED_OUTPATIENT_CLINIC_OR_DEPARTMENT_OTHER): Admitting: Physical Therapy

## 2024-02-18 ENCOUNTER — Other Ambulatory Visit (HOSPITAL_BASED_OUTPATIENT_CLINIC_OR_DEPARTMENT_OTHER): Payer: Self-pay | Admitting: Orthopaedic Surgery

## 2024-02-18 MED ORDER — METHOCARBAMOL 500 MG PO TABS: 500.0000 mg | ORAL_TABLET | Freq: Three times a day (TID) | ORAL | 0 refills | Status: AC

## 2024-02-19 ENCOUNTER — Encounter (HOSPITAL_BASED_OUTPATIENT_CLINIC_OR_DEPARTMENT_OTHER): Admitting: Physical Therapy

## 2024-02-24 ENCOUNTER — Encounter (HOSPITAL_BASED_OUTPATIENT_CLINIC_OR_DEPARTMENT_OTHER): Admitting: Physical Therapy

## 2024-02-24 ENCOUNTER — Ambulatory Visit (HOSPITAL_BASED_OUTPATIENT_CLINIC_OR_DEPARTMENT_OTHER): Admitting: Orthopaedic Surgery

## 2024-02-24 DIAGNOSIS — S46892A Other injury of other muscles, fascia and tendons at shoulder and upper arm level, left arm, initial encounter: Secondary | ICD-10-CM

## 2024-02-24 NOTE — Progress Notes (Signed)
 Post Operative Evaluation    Procedure/Date of Surgery: Left shoulder subscapularis repair 7/22  Interval History:    Presents today 2 weeks status post left shoulder subscapularis repair doing well.  He has started physical therapy.  Overall he has little to no pain at today's visit.   PMH/PSH/Family History/Social History/Meds/Allergies:    Past Medical History:  Diagnosis Date   [place]   [place]    Arthritis 2021   rt knee   Routine general medical examination at a health care facility 04/08/2023   Past Surgical History:  Procedure Laterality Date   BICEPT TENODESIS Left 02/09/2024   Procedure: RIGHT PECTORAL BICEPS TENODESIS / POSSIBLE DERMAL AUGMENTATION;  Surgeon: Genelle Standing, MD;  Location: [facility];  Service: Orthopedics;  Laterality: Left;   CLAVICLE SURGERY  2011   broke collar bone skiing   COLON SURGERY  around 2007   benign lipoma inside of colon lead to intusseption   LIPOMA EXCISION  2007   benign lipoma in colon, caused intersepcion   OPEN SUBSCAPULARIS REPAIR Left 02/09/2024   Procedure: LEFT SHOULDER OPEN SUBSCAPULARIS REPAIR;  Surgeon: Genelle Standing, MD;  Location: [facility];  Service: Orthopedics;  Laterality: Left;   SHOULDER ARTHROSCOPY Left 02/09/2024   Procedure: ARTHROSCOPY, SHOULDER;  Surgeon: Genelle Standing, MD;  Location: [facility];  Service: Orthopedics;  Laterality: Left;   SHOULDER ARTHROSCOPY WITH DEBRIDEMENT AND BICEP TENDON REPAIR Left 03/11/2022   Procedure: LEFT SHOULDER ARTHROSCOPY WITH MINI OPEN SUBSCAPULARIS REPAIR  AND BICEP TENDODESIS;  Surgeon: Genelle Standing, MD;  Location: [facility];  Service: Orthopedics;  Laterality: Left;   WOUND EXPLORATION N/A 05/23/2022   Procedure: WOUND EXPLORATION WITH REMOVAL OF FOREIGN BODY OF ABDOMINAL WALL;  Surgeon: Eletha Boas, MD;  Location: [facility];  Service: General;   Laterality: N/A;   Social History   Socioeconomic History   Marital status: Married    Spouse name: Vernell   Number of children: Not on file   Years of education: Not on file   Highest education level: Master's degree (e.g., MA, MS, MEng, MEd, MSW, MBA)  Occupational History   Not on file  Tobacco Use   Smoking status: Former    Types: Cigarettes   Smokeless tobacco: Never   Tobacco comments:    2 cig per year.  Vaping Use   Vaping status: Not on file  Substance and Sexual Activity   Alcohol use: Yes    Comment: occas   Drug use: Never   Sexual activity: Yes    Partners: Female  Other Topics Concern   Not on file  Social History Narrative   Not on file   Social Drivers of Health   Financial Resource Strain: Low Risk  (08/30/2023)   Overall Financial Resource Strain (CARDIA)    Difficulty of Paying Living Expenses: Not hard at all  Food Insecurity: No Food Insecurity (08/30/2023)   Hunger Vital Sign    Worried About Running Out of Food in the Last Year: Never true    Ran Out of Food in the Last Year: Never true  Transportation Needs: No Transportation Needs (08/30/2023)   PRAPARE - Administrator, Civil Service (Medical): No    Lack of Transportation (Non-Medical): No  Physical Activity:  Sufficiently Active (08/30/2023)   Exercise Vital Sign    Days of Exercise per Week: 4 days    Minutes of Exercise per Session: 50 min  Stress: No Stress Concern Present (08/30/2023)   [employer] of Occupational Health - Occupational Stress Questionnaire    Feeling of Stress : Only a little  Social Connections: Socially Integrated (08/30/2023)   Social Connection and Isolation Panel    Frequency of Communication with Friends and Family: Three times a week    Frequency of Social Gatherings with Friends and Family: Once a week    Attends Religious Services: 1 to 4 times per year    Active Member of [employer] or Organizations: Yes    Attends Engineer, structural: More  than 4 times per year    Marital Status: Married   Family History  Problem Relation Age of Onset   Anxiety disorder Mother    Cancer Mother        melanoma   Miscarriages / Stillbirths Mother    Asthma Brother    Arthritis Maternal Grandmother    Cancer Maternal Grandfather    Heart attack Maternal Grandfather    No Known Allergies Current Outpatient Medications  Medication Sig Dispense Refill   methocarbamol  (ROBAXIN ) 500 MG tablet Take 1 tablet (500 mg total) by mouth 3 (three) times daily for 7 days. 21 tablet 0   ascorbic acid (VITAMIN C) 500 MG tablet Take 500 mg by mouth daily.     aspirin  EC 325 MG tablet Take 1 tablet (325 mg total) by mouth daily. 14 tablet 0   Cholecalciferol (VITAMIN D3) 125 MCG (5000 UT) TABS Take 5,000 Units by mouth daily.     cyanocobalamin (VITAMIN B12) 1000 MCG tablet Take 1,000 mcg by mouth daily.     fexofenadine (ALLEGRA) 180 MG tablet Take 180 mg by mouth daily as needed for allergies.     oxyCODONE  (ROXICODONE ) 5 MG immediate release tablet Take 1 tablet (5 mg total) by mouth every 4 (four) hours as needed for severe pain (pain score 7-10) or breakthrough pain. 10 tablet 0   No current facility-administered medications for this visit.   No results found.  Review of Systems:   A ROS was performed including pertinent positives and negatives as documented in the HPI.   Musculoskeletal Exam:    There were no vitals taken for this visit.  Left shoulder with well-appearing incision.  In the standing position he can forward elevate to approximately 50 degrees with external rotation at the side to 30 degrees.  Internal rotation deferred today.  Distal neurosensory exam is intact  Imaging:      I personally reviewed and interpreted the radiographs.   Assessment:   2 weeks status post left shoulder subscapularis repair open overall doing very well.  At today's visit I did describe that I would recommend allowed active forward elevation in  the scapular plane as well as external rotation.  I did describe that mildly restriction would be no internal rotation against weight or no going behind the back.  Other than that he may begin active forward elevation as well as external rotation  Plan :    - Return to clinic 4 weeks for reassessment      I personally saw and evaluated the patient, and participated in the management and treatment plan.  Elspeth Parker, MD Attending Physician, Orthopedic Surgery  This document was dictated using Dragon voice recognition software. A reasonable attempt at proof reading has  been made to minimize errors.

## 2024-02-25 ENCOUNTER — Inpatient Hospital Stay
Admission: RE | Admit: 2024-02-25 | Discharge: 2024-02-25 | Disposition: A | Discharge status: Home / Self Care | Source: Ambulatory Visit | Attending: Orthopedic Surgery

## 2024-02-25 DIAGNOSIS — S86812A Strain of other muscle(s) and tendon(s) at lower leg level, left leg, initial encounter: Secondary | ICD-10-CM

## 2024-02-26 ENCOUNTER — Ambulatory Visit: Admitting: Physical Therapy

## 2024-02-26 ENCOUNTER — Encounter: Payer: Self-pay | Admitting: Physical Therapy

## 2024-02-26 DIAGNOSIS — R600 Localized edema: Secondary | ICD-10-CM | POA: Diagnosis not present

## 2024-02-26 DIAGNOSIS — M25512 Pain in left shoulder: Secondary | ICD-10-CM | POA: Diagnosis not present

## 2024-02-26 DIAGNOSIS — M25612 Stiffness of left shoulder, not elsewhere classified: Secondary | ICD-10-CM

## 2024-02-26 DIAGNOSIS — M6281 Muscle weakness (generalized): Secondary | ICD-10-CM | POA: Diagnosis not present

## 2024-02-26 NOTE — Therapy (Signed)
 OUTPATIENT PHYSICAL THERAPY SHOULDER TREATMENT    Patient Name: Kent Johnson MRN: [id number] DOB:02/21/1986, 38 y.o., male Today's Date: 02/26/2024  END OF SESSION:  PT End of Session - 02/26/24 0824     Visit Number 2    Number of Visits 24    Date for PT Re-Evaluation 05/06/24    Authorization Type BCBS Auth needed    PT Start Time 0803    PT Stop Time 0841    PT Time Calculation (min) 38 min    Activity Tolerance Patient tolerated treatment well;No increased pain    Behavior During Therapy [facility] for tasks assessed/performed           Past Medical History:  Diagnosis Date   [place]   [place]    Arthritis 2021   rt knee   Routine general medical examination at a health care facility 04/08/2023   Past Surgical History:  Procedure Laterality Date   BICEPT TENODESIS Left 02/09/2024   Procedure: RIGHT PECTORAL BICEPS TENODESIS / POSSIBLE DERMAL AUGMENTATION;  Surgeon: Kent Standing, MD;  Location: [facility];  Service: Orthopedics;  Laterality: Left;   CLAVICLE SURGERY  2011   broke collar bone skiing   COLON SURGERY  around 2007   benign lipoma inside of colon lead to intusseption   LIPOMA EXCISION  2007   benign lipoma in colon, caused intersepcion   OPEN SUBSCAPULARIS REPAIR Left 02/09/2024   Procedure: LEFT SHOULDER OPEN SUBSCAPULARIS REPAIR;  Surgeon: Kent Standing, MD;  Location: [facility];  Service: Orthopedics;  Laterality: Left;   SHOULDER ARTHROSCOPY Left 02/09/2024   Procedure: ARTHROSCOPY, SHOULDER;  Surgeon: Kent Standing, MD;  Location: [facility];  Service: Orthopedics;  Laterality: Left;   SHOULDER ARTHROSCOPY WITH DEBRIDEMENT AND BICEP TENDON REPAIR Left 03/11/2022   Procedure: LEFT SHOULDER ARTHROSCOPY WITH MINI OPEN SUBSCAPULARIS REPAIR  AND BICEP TENDODESIS;  Surgeon: Kent Standing, MD;  Location: [facility];  Service: Orthopedics;  Laterality: Left;    WOUND EXPLORATION N/A 05/23/2022   Procedure: WOUND EXPLORATION WITH REMOVAL OF FOREIGN BODY OF ABDOMINAL WALL;  Surgeon: Eletha Boas, MD;  Location: [facility];  Service: General;  Laterality: N/A;   Patient Active Problem List   Diagnosis Date Noted   Routine general medical examination at a health care facility 04/08/2023   Foreign body in anterior abdominal wall 05/18/2022   Avulsion of left subscapularis    Anxiety disorder 11/11/2021   Insomnia 11/11/2021    PCP: [facility]  REFERRING PROVIDER: Standing Genelle, MD  REFERRING DIAG: S46.892A (ICD-10-CM) - Avulsion of left subscapularis, initial encounter  THERAPY DIAG:  Muscle weakness (generalized)  Stiffness of left shoulder, not elsewhere classified  Acute pain of left shoulder  Localized edema  Rationale for Evaluation and Treatment: Rehabilitation  ONSET DATE: 02/09/2024  SUBJECTIVE:  SUBJECTIVE STATEMENT:  Things have been going well since Kent Johnson saw me that first day, HEP is going well. Shoulder feels well only pain I've had has been in the pec muscle, Kent Johnson me to take ABD bolster out of sling.    EVAL: Kent Johnson had a rotator cuff (subscapularis) repair in 2023.  He re-tore it this year with surgery 72 hours ago.  Kent Johnson notes his pain has been much better this time around as he feels like the nerve block is still kicking in.  He has not required any prescription pain medication to this point.  Hand dominance: Left  PERTINENT HISTORY:  Left biceps tenodesis, left subscapularis repair 2023, left clavicle fracture 2011  PAIN:  Are you having pain? Yes: NPRS scale: 1/10 Pain location: L pec  Pain description: Slight ache/cramp  Aggravating factors: NA Relieving factors: NA  PRECAUTIONS: Shoulder-revision.  This is  Kent Johnson's second subscapularis repair on the left side so we will need to be conservative with starting strengthening given his revision status.  Recommend holding off until the end of week 10 post-surgery before beginning any resisted strength work.  RED FLAGS: None   WEIGHT BEARING RESTRICTIONS: Yes No left upper extremity weight-bearing  FALLS:  Has patient fallen in last 6 months? No  LIVING ENVIRONMENT: Lives with: lives with their family, lives with their spouse, and with 3 children Lives in: House/apartment Stairs: No problems Has following equipment at home: Lt shoulder brace  OCCUPATION: Sport and exercise psychologist, power infrastructure  PLOF: Independent  PATIENT GOALS:Return to swimming, yoga and ADLs without restrictions  NEXT MD VISIT: August 6  OBJECTIVE:   Note: Objective measures were completed at Evaluation unless otherwise noted.  DIAGNOSTIC FINDINGS:  1. Prior rotator cuff repair. Moderate supraspinatus tendinosis. 2. High-grade partial, near complete, tear of the subscapularis tendon. 3. No labral tear.  PATIENT SURVEYS:  PSFS: THE PATIENT SPECIFIC FUNCTIONAL SCALE  Place score of 0-10 (0 = unable to perform activity and 10 = able to perform activity at the same level as before injury or problem)  Activity Date: 02/12/2024    Swimming 1    2.  Lifting the arm straight out 1    3.  Shoulder press at the gym 0    4.  Yoga 3    Total Score 1.25      Total Score = Sum of activity scores/number of activities  Minimally Detectable Change: 3 points (for single activity); 2 points (for average score)  [facility] (nd). The Patient Specific Functional Scale . Retrieved from [email or web address]   COGNITION: Overall cognitive status: Within functional limits for tasks assessed     SENSATION: WFL  POSTURE: No significant postural issues noted  UPPER EXTREMITY ROM:   Passive ROM Left/Right  02/12/2024   Shoulder flexion 105/175   Shoulder extension    Shoulder abduction    Shoulder horizontal adduction 0/35   Shoulder internal rotation 50/70   Shoulder external rotation 30/105   Elbow flexion    Elbow extension    Wrist flexion    Wrist extension    Wrist ulnar deviation    Wrist radial deviation    Wrist pronation    Wrist supination    (Blank rows = not tested)  UPPER EXTREMITY STRENGTH: Deferred due to being 72 hours post-surgery  Assessed in pounds with hand-held dynamometer Left/Right   Shoulder flexion    Shoulder extension    Shoulder abduction    Shoulder adduction    Shoulder internal  rotation    Shoulder external rotation    Middle trapezius    Lower trapezius    Elbow flexion    Elbow extension    Wrist flexion    Wrist extension    Wrist ulnar deviation    Wrist radial deviation    Wrist pronation    Wrist supination    Grip strength (lbs)    (Blank rows = not tested)                                                                                              TREATMENT DATE:   02/26/24  PROM/stretching L shoulder forward elevation, scaption, very gentle ER to tolerance; avoided IR to protect subscap repair  AAROM supine flexion x12 with cane Supine chest press with cane x12  Gentle shoulder flexion isometrics, ABD isometrics, bicep flexion isometrics x12 each   Education on anatomy of subscap repair and clinical reasoning for precautions, clinical progressions, biomechanical forces on subscap and on rotator cuff and when with shoulder movements/why certain movements are still safe to work on in context of multiple subscap surgeries    02/12/2024 Codman's/pendulums: Forward and back; side-to-side; clockwise and counterclockwise 20 times each.  Emphasis on letting the arm relax in the body control of motion with cues to try to get his chest as parallel to the floor as possible as this activity gets more comfortable. Scapular  retraction/shoulder blade pinch 10 x 5 seconds.  Avoid elbow extension to minimize strain on the subscapularis repair.  [id number]: Discussed post-op note; today's examination findings; activities to avoid (particularly pressing up out of a chair and anything that involves extension, external rotation or elbow/hand behind the back); discussed recommendations to go slower with strength progressions given his revision status and a recommended 12-week course of rehabilitation   PATIENT EDUCATION: Education details: See above Person educated: Patient Education method: Explanation, Demonstration, Tactile cues, Verbal cues, and Handouts Education comprehension: verbalized understanding, returned demonstration, verbal cues required, tactile cues required, and needs further education  HOME EXERCISE PROGRAM:   Access Code: [email or web address] URL: [email or web address] Date: 02/26/2024 Prepared by: Josette Rough  Exercises - Pendulums  - 3-5 x daily - 7 x weekly - 1 sets - 20-30 reps - Standing Scapular Retraction  - 5 x daily - 7 x weekly - 1 sets - 5 reps - 5 second hold - Supine Shoulder Flexion with Dowel  - 2 x daily - 7 x weekly - 1 sets - 10 reps - Isometric Shoulder Flexion at Wall  - 1 x daily - 7 x weekly - 1-2 sets - 10 reps - Isometric Shoulder Abduction at Wall  - 1 x daily - 7 x weekly - 1-2 sets - 10 reps - Seated Isometric Elbow Flexion  - 1 x daily - 7 x weekly - 1-2 sets - 10 reps  ASSESSMENT:  CLINICAL IMPRESSION:  Arrives today feeling well, had a lot of questions about anatomy of subscap repair and clinical reasoning for precautions. Worked primarily on PROM and AAROM today avoiding any motions that would stress subscap repair/surgical revision. Agree that conservative approach to strength  is needed here. Tolerated all interventions well today. Updated HEP conservatively as per MD guidelines from visit 02/24/24.     EVAL: Patient is a 38 y.o. male who was seen today for  physical therapy evaluation and treatment for S46.892A (ICD-10-CM) - Avulsion of left subscapularis, initial encounter.  This is Kent Johnson's second subscapularis repair in less than 2 years.  He is doing quite well with his pain management at this point but has the expected limitations in his passive range of motion.  Kent Johnson and I discussed at length postsurgical precautions including avoiding using the left hand to push up out of a chair and to avoid anything with elbow or hand extension, external rotation or behind the back.  I recommend he follow a fairly conservative protocol given his revision status, even at his young age with likely healthy tissue as the literature suggest scar tissue is certainly not as strong as normal healthy tissue I would anticipate with his second surgery he is going to have quite a bit of scar tissue.  Kent Johnson is an appropriate rehabilitation candidate and should meet the below listed goals.  OBJECTIVE IMPAIRMENTS: decreased activity tolerance, decreased endurance, decreased knowledge of condition, decreased ROM, decreased strength, decreased safety awareness, increased edema, impaired perceived functional ability, impaired UE functional use, and pain.   ACTIVITY LIMITATIONS: carrying, lifting, dressing, and reach over head  PARTICIPATION LIMITATIONS: cleaning, driving, community activity, occupation, and yard work  PERSONAL FACTORS: Left biceps tenodesis, left subscapularis repair 2023, left clavicle fracture 2011 are also affecting patient's functional outcome.   REHAB POTENTIAL: Good  CLINICAL DECISION MAKING: Evolving/moderate complexity  EVALUATION COMPLEXITY: Moderate   GOALS: Goals reviewed with patient? Yes  SHORT TERM GOALS: Target date: 03/25/2024  Kent Johnson will be independent with his day 1 home exercise program Baseline: Started 02/12/2024 Goal status: INITIAL  2.  Improve left shoulder passive range of motion for flexion to 135 degrees; internal rotation to 60  degrees; external rotation to 30 degrees and horizontal adduction to 30 degrees Baseline: 105; 50; 30 and 0 respectively Goal status: INITIAL   LONG TERM GOALS: Target date: 05/06/2024  Improve patient specific functional score to at least 6 Baseline: 1.25 Goal status: INITIAL  2.  Kent Johnson will be able to maintain left shoulder pain at a level of 2/10 or better on the visual analog scale Baseline: 2/10 72 hours post-surgery Goal status: INITIAL  3.  Improve left shoulder active range of motion to 90% or better of the uninvolved right Baseline: See objective Goal status: INITIAL  4.  Improve left shoulder strength for internal/external rotation to 60% of the uninvolved right at 12 weeks postsurgery (9 months to a year expected for 90% plus strength after the revision) Baseline: Deferred secondary to surgery 72 hours ago Goal status: INITIAL  5.  Kent Johnson will be independent with his long-term maintenance home exercise program at discharge Baseline: Started 02/12/2024 Goal status: INITIAL   PLAN:  PT FREQUENCY: 2x/week  PT DURATION: 12 weeks  PLANNED INTERVENTIONS: 97750- Physical Performance Testing, 97110-Therapeutic exercises, 97530- Therapeutic activity, 97112- Neuromuscular re-education, 97535- Self Care, [id number]- Manual therapy, [id number]- Electrical stimulation (unattended), 97016- Vasopneumatic device, Patient/Family education, Joint mobilization, and Cryotherapy  PLAN FOR NEXT SESSION: Passive range of motion with limitations of 30 degrees of external rotation and avoiding extension and behind the back function for the first 4 weeks.  Consider progression into active assisted and active range of motion weeks 5 through 10.  Transition into resisted strengthening week 10-12 post-surgery with  expected transfer into independent rehabilitation at the end of week 12.  Keep in mind this is a revision and a more conservative protocol in regards to strength progression is  recommended.  02/26/24- per MD note from 02/24/24 (updated surgical precautions)  2 weeks status post left shoulder subscapularis repair open overall doing very well. At today's visit I did describe that I would recommend allowed active forward elevation in the scapular plane as well as external rotation. I did describe that mildly restriction would be no internal rotation against weight or no going behind the back. Other than that he may begin active forward elevation as well as external rotation    Josette Rough, PT, DPT 02/26/24 8:43 AM

## 2024-03-01 ENCOUNTER — Encounter

## 2024-03-02 ENCOUNTER — Encounter (HOSPITAL_BASED_OUTPATIENT_CLINIC_OR_DEPARTMENT_OTHER): Admitting: Physical Therapy

## 2024-03-04 ENCOUNTER — Ambulatory Visit (INDEPENDENT_AMBULATORY_CARE_PROVIDER_SITE_OTHER): Admitting: Rehabilitative and Restorative Service Providers"

## 2024-03-04 ENCOUNTER — Encounter: Payer: Self-pay | Admitting: Rehabilitative and Restorative Service Providers"

## 2024-03-04 DIAGNOSIS — R600 Localized edema: Secondary | ICD-10-CM | POA: Diagnosis not present

## 2024-03-04 DIAGNOSIS — M25612 Stiffness of left shoulder, not elsewhere classified: Secondary | ICD-10-CM | POA: Diagnosis not present

## 2024-03-04 DIAGNOSIS — M25512 Pain in left shoulder: Secondary | ICD-10-CM

## 2024-03-04 DIAGNOSIS — M6281 Muscle weakness (generalized): Secondary | ICD-10-CM

## 2024-03-04 NOTE — Therapy (Signed)
 OUTPATIENT PHYSICAL THERAPY SHOULDER TREATMENT    Patient Name: Kent Johnson MRN: [id number] DOB:1986/05/03, 38 y.o., male Today's Date: 03/04/2024  END OF SESSION:  PT End of Session - 03/04/24 0756     Visit Number 3    Number of Visits 24    Date for PT Re-Evaluation 05/06/24    Authorization Type BCBS Auth needed    PT Start Time 0756    PT Stop Time 0845    PT Time Calculation (min) 49 min    Activity Tolerance Patient tolerated treatment well;No increased pain    Behavior During Therapy [facility] for tasks assessed/performed            Past Medical History:  Diagnosis Date   [place]   [place]    Arthritis 2021   rt knee   Routine general medical examination at a health care facility 04/08/2023   Past Surgical History:  Procedure Laterality Date   BICEPT TENODESIS Left 02/09/2024   Procedure: RIGHT PECTORAL BICEPS TENODESIS / POSSIBLE DERMAL AUGMENTATION;  Surgeon: Genelle Standing, MD;  Location: [facility];  Service: Orthopedics;  Laterality: Left;   CLAVICLE SURGERY  2011   broke collar bone skiing   COLON SURGERY  around 2007   benign lipoma inside of colon lead to intusseption   LIPOMA EXCISION  2007   benign lipoma in colon, caused intersepcion   OPEN SUBSCAPULARIS REPAIR Left 02/09/2024   Procedure: LEFT SHOULDER OPEN SUBSCAPULARIS REPAIR;  Surgeon: Genelle Standing, MD;  Location: [facility];  Service: Orthopedics;  Laterality: Left;   SHOULDER ARTHROSCOPY Left 02/09/2024   Procedure: ARTHROSCOPY, SHOULDER;  Surgeon: Genelle Standing, MD;  Location: [facility];  Service: Orthopedics;  Laterality: Left;   SHOULDER ARTHROSCOPY WITH DEBRIDEMENT AND BICEP TENDON REPAIR Left 03/11/2022   Procedure: LEFT SHOULDER ARTHROSCOPY WITH MINI OPEN SUBSCAPULARIS REPAIR  AND BICEP TENDODESIS;  Surgeon: Genelle Standing, MD;  Location: [facility];  Service: Orthopedics;  Laterality: Left;    WOUND EXPLORATION N/A 05/23/2022   Procedure: WOUND EXPLORATION WITH REMOVAL OF FOREIGN BODY OF ABDOMINAL WALL;  Surgeon: Eletha Boas, MD;  Location: [facility];  Service: General;  Laterality: N/A;   Patient Active Problem List   Diagnosis Date Noted   Routine general medical examination at a health care facility 04/08/2023   Foreign body in anterior abdominal wall 05/18/2022   Avulsion of left subscapularis    Anxiety disorder 11/11/2021   Insomnia 11/11/2021    PCP: [facility]  REFERRING PROVIDER: Standing Genelle, MD  REFERRING DIAG: S46.892A (ICD-10-CM) - Avulsion of left subscapularis, initial encounter  THERAPY DIAG:  Muscle weakness (generalized)  Stiffness of left shoulder, not elsewhere classified  Acute pain of left shoulder  Localized edema  Rationale for Evaluation and Treatment: Rehabilitation  ONSET DATE: 02/09/2024  SUBJECTIVE:  SUBJECTIVE STATEMENT: Kent Johnson noted some pain with his dowel exercise so he has not been doing anything with that.  Otherwise, good HEP compliance.   EVAL: Kent Johnson had a rotator cuff (subscapularis) repair in 2023.  He re-tore it this year with surgery 72 hours ago.  Kent Johnson notes his pain has been much better this time around as he feels like the nerve block is still kicking in.  He has not required any prescription pain medication to this point.  Hand dominance: Left  PERTINENT HISTORY:  Left biceps tenodesis, left subscapularis repair 2023, left clavicle fracture 2011  PAIN:  Are you having pain? Yes: NPRS scale: 0-4/10 Pain location: L pectoralis  Pain description: Slight ache/cramp  Aggravating factors: Dowel exercise Relieving factors: NA  PRECAUTIONS: Shoulder-revision.  This is Kent Johnson's second subscapularis repair on the left side so  we will need to be conservative with starting strengthening given his revision status.  Recommend holding off until the end of week 10 post-surgery before beginning any resisted strength work.  RED FLAGS: None   WEIGHT BEARING RESTRICTIONS: Yes No left upper extremity weight-bearing  FALLS:  Has patient fallen in last 6 months? No  LIVING ENVIRONMENT: Lives with: lives with their family, lives with their spouse, and with 3 children Lives in: House/apartment Stairs: No problems Has following equipment at home: Lt shoulder brace  OCCUPATION: Sport and exercise psychologist, power infrastructure  PLOF: Independent  PATIENT GOALS:Return to swimming, yoga and ADLs without restrictions  NEXT MD VISIT: March 23, 2024  OBJECTIVE:   Note: Objective measures were completed at Evaluation unless otherwise noted.  DIAGNOSTIC FINDINGS:  1. Prior rotator cuff repair. Moderate supraspinatus tendinosis. 2. High-grade partial, near complete, tear of the subscapularis tendon. 3. No labral tear.  PATIENT SURVEYS:  PSFS: THE PATIENT SPECIFIC FUNCTIONAL SCALE  Place score of 0-10 (0 = unable to perform activity and 10 = able to perform activity at the same level as before injury or problem)  Activity Date: 02/12/2024    Swimming 1    2.  Lifting the arm straight out 1    3.  Shoulder press at the gym 0    4.  Yoga 3    Total Score 1.25      Total Score = Sum of activity scores/number of activities  Minimally Detectable Change: 3 points (for single activity); 2 points (for average score)  [name] (nd). The Patient Specific Functional Scale . Retrieved from [email or web address]   COGNITION: Overall cognitive status: Within functional limits for tasks assessed     SENSATION: WFL  POSTURE: No significant postural issues noted  UPPER EXTREMITY ROM:   Passive ROM Left/Right 02/12/2024 Left 03/04/2024  Shoulder flexion 105/175  130  Shoulder extension    Shoulder abduction    Shoulder horizontal adduction 0/35   Shoulder internal rotation 50/70 40  Shoulder external rotation 30/105 45  Elbow flexion    Elbow extension    Wrist flexion    Wrist extension    Wrist ulnar deviation    Wrist radial deviation    Wrist pronation    Wrist supination    (Blank rows = not tested)  UPPER EXTREMITY STRENGTH: Deferred due to being 72 hours post-surgery  Assessed in pounds with hand-held dynamometer Left/Right   Shoulder flexion    Shoulder extension    Shoulder abduction    Shoulder adduction    Shoulder internal rotation    Shoulder external rotation    Middle trapezius    Lower  trapezius    Elbow flexion    Elbow extension    Wrist flexion    Wrist extension    Wrist ulnar deviation    Wrist radial deviation    Wrist pronation    Wrist supination    Grip strength (lbs)    (Blank rows = not tested)                                                                                              TREATMENT DATE:  03/04/2024 -Codman's/pendulums: Forward and back; side-to-side; clockwise and counterclockwise 20 times each.  Focus on letting the arm relax and let the body control the motion  -Supine IR stretch supine, elbow 70 degrees to side, reach towards pants pocket 10 x 10 seconds, should be no pain with this activity -Empty hand biceps curl with supination (to minimize atrophy) 10 x 3 seconds  Avoid aggressive shoulder extension and ER to minimize strain on the subscapularis revision.  Functional Activities: -Scapular retraction/shoulder blade pinch 10 x 5 seconds (Postural correction and scapular strengthening) -Serratus anterior punch/Supine arm raise 10 x 3 seconds (Right side can help) for reaching and scapular protraction -Supine shoulder flexion AAROM (right helps left) 10 x 10 seconds (protract 1st with left palm facing in)  02464:  Reviewed with Kent Johnson that it is okay to go slow at this point in  his post-revision physical therapy.  Updated and reviewed his home exercise program.   02/26/24 PROM/stretching L shoulder forward elevation, scaption, very gentle ER to tolerance; avoided IR to protect subscap repair  AAROM supine flexion x12 with cane Supine chest press with cane x12  Gentle shoulder flexion isometrics, ABD isometrics, bicep flexion isometrics x12 each   Education on anatomy of subscap repair and clinical reasoning for precautions, clinical progressions, biomechanical forces on subscap and on rotator cuff and when with shoulder movements/why certain movements are still safe to work on in context of multiple subscap surgeries    02/12/2024 Codman's/pendulums: Forward and back; side-to-side; clockwise and counterclockwise 20 times each.  Emphasis on letting the arm relax in the body control of motion with cues to try to get his chest as parallel to the floor as possible as this activity gets more comfortable. Scapular retraction/shoulder blade pinch 10 x 5 seconds.  Avoid elbow extension to minimize strain on the subscapularis repair.  42464: Discussed post-op note; today's examination findings; activities to avoid (particularly pressing up out of a chair and anything that involves extension, external rotation or elbow/hand behind the back); discussed recommendations to go slower with strength progressions given his revision status and a recommended 12-week course of rehabilitation   PATIENT EDUCATION: Education details: See above Person educated: Patient Education method: Explanation, Demonstration, Tactile cues, Verbal cues, and Handouts Education comprehension: verbalized understanding, returned demonstration, verbal cues required, tactile cues required, and needs further education  HOME EXERCISE PROGRAM: Access Code: [email or web address] URL: [email or web address] Date: 03/04/2024 Prepared by: Lamar Ivory  Exercises - Pendulums  - 3 x daily - 7 x weekly - 1 sets  - 20-30 reps - Standing Scapular Retraction  - 5 x daily - 7  x weekly - 1 sets - 5 reps - 5 second hold - Isometric Shoulder Flexion at Wall  - 1 x daily - 7 x weekly - 1-2 sets - 10 reps - Isometric Shoulder Abduction at Wall  - 1 x daily - 7 x weekly - 1-2 sets - 10 reps - Seated Isometric Elbow Flexion  - 1 x daily - 7 x weekly - 1-2 sets - 10 reps - Supine Scapular Protraction in Flexion with Dumbbells  - 1 x daily - 7 x weekly - 1 sets - 20 reps - 3 seconds hold - Supine Shoulder Flexion PROM  - 1 x daily - 7 x weekly - 1 sets - 10 reps - 5 seconds hold   ASSESSMENT:  CLINICAL IMPRESSION: Kent Johnson again reinforced his preference to go slow and conservative with his post revision physical therapy.  I agree this makes sense given his revision status and his desire to avoid a third surgery.  Given his current active assisted range of motion (which is quite good for 3-1/2 weeks post-surgery), because strength is never high priority at this point post-revision and because of his preference, I recommend Kent Johnson remain on a conservative protocol.  Dr. Genelle is OK with a less conservative approach, so it is certainly appropriate to progress him when he is ready.  I recommend continued appropriate scapular, active assisted and active range of motion progressions while protecting the subscapularis repair by avoiding excessive extension or external rotation of the shoulder or any resisted shoulder internal rotation.    EVAL: Patient is a 38 y.o. male who was seen today for physical therapy evaluation and treatment for S46.892A (ICD-10-CM) - Avulsion of left subscapularis, initial encounter.  This is Kent Johnson's second subscapularis repair in less than 2 years.  He is doing quite well with his pain management at this point but has the expected limitations in his passive range of motion.  Kent Johnson and I discussed at length postsurgical precautions including avoiding using the left hand to push up out of a chair and to  avoid anything with elbow or hand extension, external rotation or behind the back.  I recommend he follow a fairly conservative protocol given his revision status, even at his young age with likely healthy tissue as the literature suggest scar tissue is certainly not as strong as normal healthy tissue I would anticipate with his second surgery he is going to have quite a bit of scar tissue.  Kent Johnson is an appropriate rehabilitation candidate and should meet the below listed goals.  OBJECTIVE IMPAIRMENTS: decreased activity tolerance, decreased endurance, decreased knowledge of condition, decreased ROM, decreased strength, decreased safety awareness, increased edema, impaired perceived functional ability, impaired UE functional use, and pain.   ACTIVITY LIMITATIONS: carrying, lifting, dressing, and reach over head  PARTICIPATION LIMITATIONS: cleaning, driving, community activity, occupation, and yard work  PERSONAL FACTORS: Left biceps tenodesis, left subscapularis repair 2023, left clavicle fracture 2011 are also affecting patient's functional outcome.   REHAB POTENTIAL: Good  CLINICAL DECISION MAKING: Evolving/moderate complexity  EVALUATION COMPLEXITY: Moderate   GOALS: Goals reviewed with patient? Yes  SHORT TERM GOALS: Target date: 03/25/2024  Kent Johnson will be independent with his day 1 home exercise program Baseline: Started 02/12/2024 Goal status: Met 03/04/2024  2.  Improve left shoulder passive range of motion for flexion to 135 degrees; internal rotation to 60 degrees; external rotation to 30 degrees and horizontal adduction to 30 degrees Baseline: 105; 50; 30 and 0 respectively Goal status: Partially met 03/04/2024  LONG TERM GOALS: Target date: 05/06/2024  Improve patient specific functional score to at least 6 Baseline: 1.25 Goal status: INITIAL  2.  Kent Johnson will be able to maintain left shoulder pain at a level of 2/10 or better on the visual analog scale Baseline: 2/10 72  hours post-surgery Goal status: INITIAL  3.  Improve left shoulder active range of motion to 90% or better of the uninvolved right Baseline: See objective Goal status: INITIAL  4.  Improve left shoulder strength for internal/external rotation to 60% of the uninvolved right at 12 weeks postsurgery (9 months to a year expected for 90% plus strength after the revision) Baseline: Deferred secondary to surgery 72 hours ago Goal status: INITIAL  5.  Kent Johnson will be independent with his long-term maintenance home exercise program at discharge Baseline: Started 02/12/2024 Goal status: INITIAL   PLAN:  PT FREQUENCY: 2x/week  PT DURATION: 12 weeks  PLANNED INTERVENTIONS: 97750- Physical Performance Testing, 97110-Therapeutic exercises, 97530- Therapeutic activity, 97112- Neuromuscular re-education, 97535- Self Care, [id number]- Manual therapy, [id number]- Electrical stimulation (unattended), 97016- Vasopneumatic device, Patient/Family education, Joint mobilization, and Cryotherapy  PLAN FOR NEXT SESSION: Passive range of motion with limitations of avoid excessive external rotation and avoiding excessive extension and behind the back function for the first 4-6 weeks.  Consider progression into active assisted and active range of motion weeks 5 through 10.  Transition into resisted strengthening week 10-12 post-surgery with expected transfer into independent rehabilitation at the end of week 12.  Keep in mind this is a revision and a more conservative protocol in regards to strength progression is recommended.  02/26/24- per MD note from 02/24/24 (updated surgical precautions)  2 weeks status post left shoulder subscapularis repair open overall doing very well. At today's visit I did describe that I would recommend allowed active forward elevation in the scapular plane as well as external rotation. I did describe that mildly restriction would be no internal rotation against weight or no going behind the back. Other  than that he may begin active forward elevation as well as external rotation    Myer LELON Ivory, PT, MPT 03/04/24 9:04 AM

## 2024-03-07 ENCOUNTER — Encounter (HOSPITAL_BASED_OUTPATIENT_CLINIC_OR_DEPARTMENT_OTHER): Admitting: Physical Therapy

## 2024-03-07 NOTE — Therapy (Signed)
 OUTPATIENT PHYSICAL THERAPY SHOULDER TREATMENT    Patient Name: Kent Johnson MRN: [id number] DOB:1985/11/05, 38 y.o., male Today's Date: 03/08/2024  END OF SESSION:  PT End of Session - 03/08/24 1116     Visit Number 4    Number of Visits 24    Date for PT Re-Evaluation 05/06/24    Authorization Type BCBS Auth needed    PT Start Time 0847    PT Stop Time 0925    PT Time Calculation (min) 38 min    Activity Tolerance Patient tolerated treatment well    Behavior During Therapy [facility] for tasks assessed/performed             Past Medical History:  Diagnosis Date   [place]   [place]    Arthritis 2021   rt knee   Routine general medical examination at a health care facility 04/08/2023   Past Surgical History:  Procedure Laterality Date   BICEPT TENODESIS Left 02/09/2024   Procedure: RIGHT PECTORAL BICEPS TENODESIS / POSSIBLE DERMAL AUGMENTATION;  Surgeon: Kent Standing, MD;  Location: [facility];  Service: Orthopedics;  Laterality: Left;   CLAVICLE SURGERY  2011   broke collar bone skiing   COLON SURGERY  around 2007   benign lipoma inside of colon lead to intusseption   LIPOMA EXCISION  2007   benign lipoma in colon, caused intersepcion   OPEN SUBSCAPULARIS REPAIR Left 02/09/2024   Procedure: LEFT SHOULDER OPEN SUBSCAPULARIS REPAIR;  Surgeon: Kent Standing, MD;  Location: [facility];  Service: Orthopedics;  Laterality: Left;   SHOULDER ARTHROSCOPY Left 02/09/2024   Procedure: ARTHROSCOPY, SHOULDER;  Surgeon: Kent Standing, MD;  Location: [facility];  Service: Orthopedics;  Laterality: Left;   SHOULDER ARTHROSCOPY WITH DEBRIDEMENT AND BICEP TENDON REPAIR Left 03/11/2022   Procedure: LEFT SHOULDER ARTHROSCOPY WITH MINI OPEN SUBSCAPULARIS REPAIR  AND BICEP TENDODESIS;  Surgeon: Kent Standing, MD;  Location: [facility];  Service: Orthopedics;  Laterality: Left;   WOUND  EXPLORATION N/A 05/23/2022   Procedure: WOUND EXPLORATION WITH REMOVAL OF FOREIGN BODY OF ABDOMINAL WALL;  Surgeon: Kent Boas, MD;  Location: [facility];  Service: General;  Laterality: N/A;   Patient Active Problem List   Diagnosis Date Noted   Routine general medical examination at a health care facility 04/08/2023   Foreign body in anterior abdominal wall 05/18/2022   Avulsion of left subscapularis    Anxiety disorder 11/11/2021   Insomnia 11/11/2021    PCP: Kent Johnson  REFERRING PROVIDER: Standing Genelle, MD  REFERRING DIAG: S46.892A (ICD-10-CM) - Avulsion of left subscapularis, initial encounter  THERAPY DIAG:  Muscle weakness (generalized)  Stiffness of left shoulder, not elsewhere classified  Acute pain of left shoulder  Localized edema  Rationale for Evaluation and Treatment: Rehabilitation  ONSET DATE: 02/09/2024  SUBJECTIVE:  SUBJECTIVE STATEMENT: Pt reports HEP going better and chest/anterior tightness is gone.   EVAL: Kent Johnson had a rotator cuff (subscapularis) repair in 2023.  He re-tore it this year with surgery 72 hours ago.  Kent Johnson notes his pain has been much better this time around as he feels like the nerve block is still kicking in.  He has not required any prescription pain medication to this point.  Hand dominance: Left  PERTINENT HISTORY:  Left biceps tenodesis, left subscapularis repair 2023, left clavicle fracture 2011  PAIN:  Are you having pain? Yes: NPRS scale: 1/10 Pain location: L pectoralis  Pain description: Slight ache/cramp  Aggravating factors: [place] exercise Relieving factors: NA  PRECAUTIONS: Shoulder-revision.  This is Kent Johnson's second subscapularis repair on the left side so we will need to be conservative with starting strengthening given  his revision status.  Recommend holding off until the end of week 10 post-surgery before beginning any resisted strength work.  RED FLAGS: None   WEIGHT BEARING RESTRICTIONS: Yes No left upper extremity weight-bearing  FALLS:  Has patient fallen in last 6 months? No  LIVING ENVIRONMENT: Lives with: lives with their family, lives with their spouse, and with 3 children Lives in: House/apartment Stairs: No problems Has following equipment at home: Lt shoulder brace  OCCUPATION: Sport and exercise psychologist, power infrastructure  PLOF: Independent  PATIENT GOALS:Return to swimming, yoga and ADLs without restrictions  NEXT MD VISIT: March 23, 2024  OBJECTIVE:   Note: Objective measures were completed at Evaluation unless otherwise noted.  DIAGNOSTIC FINDINGS:  1. Prior rotator cuff repair. Moderate supraspinatus tendinosis. 2. High-grade partial, near complete, tear of the subscapularis tendon. 3. No labral tear.  PATIENT SURVEYS:  PSFS: THE PATIENT SPECIFIC FUNCTIONAL SCALE  Place score of 0-10 (0 = unable to perform activity and 10 = able to perform activity at the same level as before injury or problem)  Activity Date: 02/12/2024    Swimming 1    2.  Lifting the arm straight out 1    3.  Shoulder press at the gym 0    4.  Yoga 3    Total Score 1.25      Total Score = Sum of activity scores/number of activities  Minimally Detectable Change: 3 points (for single activity); 2 points (for average score)  [facility] (nd). The Patient Specific Functional Scale . Retrieved from [email or web address]   COGNITION: Overall cognitive status: Within functional limits for tasks assessed     SENSATION: WFL  POSTURE: No significant postural issues noted  UPPER EXTREMITY ROM:   Passive ROM Left/Right 02/12/2024 Left 03/04/2024  Shoulder flexion 105/175 130  Shoulder extension    Shoulder abduction    Shoulder  horizontal adduction 0/35   Shoulder internal rotation 50/70 40  Shoulder external rotation 30/105 45  Elbow flexion    Elbow extension    Wrist flexion    Wrist extension    Wrist ulnar deviation    Wrist radial deviation    Wrist pronation    Wrist supination    (Blank rows = not tested)  UPPER EXTREMITY STRENGTH: Deferred due to being 72 hours post-surgery  Assessed in pounds with hand-held dynamometer Left/Right   Shoulder flexion    Shoulder extension    Shoulder abduction    Shoulder adduction    Shoulder internal rotation    Shoulder external rotation    Middle trapezius    Lower trapezius    Elbow flexion    Elbow extension  Wrist flexion    Wrist extension    Wrist ulnar deviation    Wrist radial deviation    Wrist pronation    Wrist supination    Grip strength (lbs)    (Blank rows = not tested)                                                                                              TREATMENT DATE:  03/08/24 -Codman's/pendulums: Forward and back; side-to-side; clockwise and counterclockwise 20 times each.  Focus on letting the arm relax and let the body control the motion  -Supine IR stretch supine, elbow 70 degrees to side, reach towards pants pocket 10 x 10 seconds, should be no pain with this activity -Empty hand biceps curl with supination (to minimize atrophy) 10 x 3 seconds - Pro /sup at elbow 10x  Avoid aggressive shoulder extension and ER to minimize strain on the subscapularis revision.  Functional Activities: -Scapular retractions/ shrugs  2x 10 x 5 seconds (Postural correction and scapular strengthening) -Serratus anterior punch/Supine arm raise 10 x 3 seconds (Right side can help) for reaching and scapular protraction -Supine shoulder flexion AAROM (right helps left) 10 x 10 seconds (protract 1st with left palm facing in)  -Manual- PROM  03/04/2024 -Codman's/pendulums: Forward and back; side-to-side; clockwise and counterclockwise 20 times  each.  Focus on letting the arm relax and let the body control the motion  -Supine IR stretch supine, elbow 70 degrees to side, reach towards pants pocket 10 x 10 seconds, should be no pain with this activity -Empty hand biceps curl with supination (to minimize atrophy) 10 x 3 seconds  Avoid aggressive shoulder extension and ER to minimize strain on the subscapularis revision.  Functional Activities: -Scapular retraction/shoulder blade pinch 10 x 5 seconds (Postural correction and scapular strengthening) -Serratus anterior punch/Supine arm raise 10 x 3 seconds (Right side can help) for reaching and scapular protraction -Supine shoulder flexion AAROM (right helps left) 10 x 10 seconds (protract 1st with left palm facing in)  02464:  Reviewed with Kent Johnson that it is okay to go slow at this point in his post-revision physical therapy.  Updated and reviewed his home exercise program.   02/26/24 PROM/stretching L shoulder forward elevation, scaption, very gentle ER to tolerance; avoided IR to protect subscap repair  AAROM supine flexion x12 with cane Supine chest press with cane x12  Gentle shoulder flexion isometrics, ABD isometrics, bicep flexion isometrics x12 each   Education on anatomy of subscap repair and clinical reasoning for precautions, clinical progressions, biomechanical forces on subscap and on rotator cuff and when with shoulder movements/why certain movements are still safe to work on in context of multiple subscap surgeries    02/12/2024 Codman's/pendulums: Forward and back; side-to-side; clockwise and counterclockwise 20 times each.  Emphasis on letting the arm relax in the body control of motion with cues to try to get his chest as parallel to the floor as possible as this activity gets more comfortable. Scapular retraction/shoulder blade pinch 10 x 5 seconds.  Avoid elbow extension to minimize strain on the subscapularis repair.  [id number]: Discussed post-op note;  today's  examination findings; activities to avoid (particularly pressing up out of a chair and anything that involves extension, external rotation or elbow/hand behind the back); discussed recommendations to go slower with strength progressions given his revision status and a recommended 12-week course of rehabilitation   PATIENT EDUCATION: Education details: See above Person educated: Patient Education method: Explanation, Demonstration, Tactile cues, Verbal cues, and Handouts Education comprehension: verbalized understanding, returned demonstration, verbal cues required, tactile cues required, and needs further education  HOME EXERCISE PROGRAM: Access Code: [email or web address] URL: [email or web address] Date: 03/04/2024 Prepared by: Lamar Ivory  Exercises - Pendulums  - 3 x daily - 7 x weekly - 1 sets - 20-30 reps - Johnson Scapular Retraction  - 5 x daily - 7 x weekly - 1 sets - 5 reps - 5 second hold - Isometric Shoulder Flexion at Wall  - 1 x daily - 7 x weekly - 1-2 sets - 10 reps - Isometric Shoulder Abduction at Wall  - 1 x daily - 7 x weekly - 1-2 sets - 10 reps - Seated Isometric Elbow Flexion  - 1 x daily - 7 x weekly - 1-2 sets - 10 reps - Supine Scapular Protraction in Flexion with Dumbbells  - 1 x daily - 7 x weekly - 1 sets - 20 reps - 3 seconds hold - Supine Shoulder Flexion PROM  - 1 x daily - 7 x weekly - 1 sets - 10 reps - 5 seconds hold   ASSESSMENT:  CLINICAL IMPRESSION: Pt needed VC for proper positioning with Pendulums, side to side.  EVAL: Patient is a 38 y.o. male who was seen today for physical therapy evaluation and treatment for S46.892A (ICD-10-CM) - Avulsion of left subscapularis, initial encounter.  This is Kent Johnson's second subscapularis repair in less than 2 years.  He is doing quite well with his pain management at this point but has the expected limitations in his passive range of motion.  Kent Johnson and I discussed at length postsurgical precautions including  avoiding using the left hand to push up out of a chair and to avoid anything with elbow or hand extension, external rotation or behind the back.  I recommend he follow a fairly conservative protocol given his revision status, even at his young age with likely healthy tissue as the literature suggest scar tissue is certainly not as strong as normal healthy tissue I would anticipate with his second surgery he is going to have quite a bit of scar tissue.  Kent Johnson is an appropriate rehabilitation candidate and should meet the below listed goals.  OBJECTIVE IMPAIRMENTS: decreased activity tolerance, decreased endurance, decreased knowledge of condition, decreased ROM, decreased strength, decreased safety awareness, increased edema, impaired perceived functional ability, impaired UE functional use, and pain.   ACTIVITY LIMITATIONS: carrying, lifting, dressing, and reach over head  PARTICIPATION LIMITATIONS: cleaning, driving, community activity, occupation, and yard work  PERSONAL FACTORS: Left biceps tenodesis, left subscapularis repair 2023, left clavicle fracture 2011 are also affecting patient's functional outcome.   REHAB POTENTIAL: Good  CLINICAL DECISION MAKING: Evolving/moderate complexity  EVALUATION COMPLEXITY: Moderate   GOALS: Goals reviewed with patient? Yes  SHORT TERM GOALS: Target date: 03/25/2024  Kent Johnson will be independent with his day 1 home exercise program Baseline: Started 02/12/2024 Goal status: Met 03/04/2024  2.  Improve left shoulder passive range of motion for flexion to 135 degrees; internal rotation to 60 degrees; external rotation to 30 degrees and horizontal adduction to 30 degrees Baseline: 105; 50; 30 and 0 respectively  Goal status: Partially met 03/04/2024   LONG TERM GOALS: Target date: 05/06/2024  Improve patient specific functional score to at least 6 Baseline: 1.25 Goal status: INITIAL  2.  Kent Johnson will be able to maintain left shoulder pain at a level of  2/10 or better on the visual analog scale Baseline: 2/10 72 hours post-surgery Goal status: INITIAL  3.  Improve left shoulder active range of motion to 90% or better of the uninvolved right Baseline: See objective Goal status: INITIAL  4.  Improve left shoulder strength for internal/external rotation to 60% of the uninvolved right at 12 weeks postsurgery (9 months to a year expected for 90% plus strength after the revision) Baseline: Deferred secondary to surgery 72 hours ago Goal status: INITIAL  5.  Kent Johnson will be independent with his long-term maintenance home exercise program at discharge Baseline: Started 02/12/2024 Goal status: INITIAL   PLAN:  PT FREQUENCY: 2x/week  PT DURATION: 12 weeks  PLANNED INTERVENTIONS: 97750- Physical Performance Testing, 97110-Therapeutic exercises, 97530- Therapeutic activity, 97112- Neuromuscular re-education, 97535- Self Care, [id number]- Manual therapy, [id number]- Electrical stimulation (unattended), [id number]- Vasopneumatic device, Patient/Family education, Joint mobilization, and Cryotherapy  PLAN FOR NEXT SESSION: Continue conservative protocol- progression into active assisted and active range of motion weeks 5 through 10.  Transition into resisted strengthening week 10-12 post-surgery with expected transfer into independent rehabilitation at the end of week 12.  Keep in mind this is a revision and a more conservative protocol in regards to strength progression is recommended.  02/26/24- per MD note from 02/24/24 (updated surgical precautions)  2 weeks status post left shoulder subscapularis repair open overall doing very well. At today's visit I did describe that I would recommend allowed active forward elevation in the scapular plane as well as external rotation. I did describe that mildly restriction would be no internal rotation against weight or no going behind the back. Other than that he may begin active forward elevation as well as external rotation     Burnard Meth, PT 03/08/24  11:21 AM

## 2024-03-08 ENCOUNTER — Ambulatory Visit (INDEPENDENT_AMBULATORY_CARE_PROVIDER_SITE_OTHER)

## 2024-03-08 DIAGNOSIS — M25612 Stiffness of left shoulder, not elsewhere classified: Secondary | ICD-10-CM | POA: Diagnosis not present

## 2024-03-08 DIAGNOSIS — M6281 Muscle weakness (generalized): Secondary | ICD-10-CM

## 2024-03-08 DIAGNOSIS — M25512 Pain in left shoulder: Secondary | ICD-10-CM | POA: Diagnosis not present

## 2024-03-08 DIAGNOSIS — R600 Localized edema: Secondary | ICD-10-CM | POA: Diagnosis not present

## 2024-03-09 ENCOUNTER — Encounter (HOSPITAL_BASED_OUTPATIENT_CLINIC_OR_DEPARTMENT_OTHER): Admitting: Physical Therapy

## 2024-03-09 ENCOUNTER — Encounter (HOSPITAL_BASED_OUTPATIENT_CLINIC_OR_DEPARTMENT_OTHER): Payer: Self-pay | Admitting: Orthopaedic Surgery

## 2024-03-10 NOTE — Therapy (Signed)
 OUTPATIENT PHYSICAL THERAPY TREATMENT    Patient Name: Kent Johnson MRN: [id number] DOB:1986-03-31, 38 y.o., male Today's Date: 03/11/2024  END OF SESSION:  PT End of Session - 03/11/24 0853     Visit Number 5    Number of Visits 24    Date for PT Re-Evaluation 05/06/24    Authorization Type BCBS Auth needed    Authorization - Visit Number 5    PT Start Time [id number]    PT Stop Time 0923    PT Time Calculation (min) 39 min    Activity Tolerance Patient tolerated treatment well    Behavior During Therapy [facility] for tasks assessed/performed              Past Medical History:  Diagnosis Date   Allergy 2017   moved [facility]   Anxiety    Arthritis 2021   rt knee   Routine general medical examination at a health care facility 04/08/2023   Past Surgical History:  Procedure Laterality Date   BICEPT TENODESIS Left 02/09/2024   Procedure: RIGHT PECTORAL BICEPS TENODESIS / POSSIBLE DERMAL AUGMENTATION;  Surgeon: Genelle Standing, MD;  Location: [facility];  Service: Orthopedics;  Laterality: Left;   CLAVICLE SURGERY  2011   broke collar bone skiing   COLON SURGERY  around 2007   benign lipoma inside of colon lead to intusseption   LIPOMA EXCISION  2007   benign lipoma in colon, caused intersepcion   OPEN SUBSCAPULARIS REPAIR Left 02/09/2024   Procedure: LEFT SHOULDER OPEN SUBSCAPULARIS REPAIR;  Surgeon: Genelle Standing, MD;  Location: [facility];  Service: Orthopedics;  Laterality: Left;   SHOULDER ARTHROSCOPY Left 02/09/2024   Procedure: ARTHROSCOPY, SHOULDER;  Surgeon: Genelle Standing, MD;  Location: [facility];  Service: Orthopedics;  Laterality: Left;   SHOULDER ARTHROSCOPY WITH DEBRIDEMENT AND BICEP TENDON REPAIR Left 03/11/2022   Procedure: LEFT SHOULDER ARTHROSCOPY WITH MINI OPEN SUBSCAPULARIS REPAIR  AND BICEP TENDODESIS;  Surgeon: Genelle Standing, MD;  Location: [facility];  Service: Orthopedics;   Laterality: Left;   WOUND EXPLORATION N/A 05/23/2022   Procedure: WOUND EXPLORATION WITH REMOVAL OF FOREIGN BODY OF ABDOMINAL WALL;  Surgeon: Eletha Boas, MD;  Location: [facility];  Service: General;  Laterality: N/A;   Patient Active Problem List   Diagnosis Date Noted   Routine general medical examination at a health care facility 04/08/2023   Foreign body in anterior abdominal wall 05/18/2022   Avulsion of left subscapularis    Anxiety disorder 11/11/2021   Insomnia 11/11/2021    PCP: [facility]  REFERRING PROVIDER: Standing Genelle, MD  REFERRING DIAG: S46.892A (ICD-10-CM) - Avulsion of left subscapularis, initial encounter  THERAPY DIAG:  Muscle weakness (generalized)  Stiffness of left shoulder, not elsewhere classified  Acute pain of left shoulder  Localized edema  Rationale for Evaluation and Treatment: Rehabilitation  ONSET DATE: 02/09/2024  SUBJECTIVE:  SUBJECTIVE STATEMENT: Reported having  Hand dominance: Left  PERTINENT HISTORY:  Left biceps tenodesis, left subscapularis repair 2023, left clavicle fracture 2011  PAIN:  NPRS scale: 1/10 Pain location: Lt pectoralis  Pain description: Slight ache/cramp  Aggravating factors: [place] exercise Relieving factors: NA  PRECAUTIONS: Shoulder-revision.  This is Kent Johnson's second subscapularis repair on the left side so we will need to be conservative with starting strengthening given his revision status.  Recommend holding off until the end of week 10 post-surgery before beginning any resisted strength work.  RED FLAGS: None   WEIGHT BEARING RESTRICTIONS: Yes No left upper extremity weight-bearing  FALLS:  Has patient fallen in last 6 months? No  LIVING ENVIRONMENT: Lives with: lives with their family, lives with their  spouse, and with 3 children Lives in: House/apartment Stairs: No problems Has following equipment at home: Lt shoulder brace  OCCUPATION: Sport and exercise psychologist, power infrastructure  PLOF: Independent  PATIENT GOALS:Return to swimming, yoga and ADLs without restrictions  NEXT MD VISIT: March 23, 2024  OBJECTIVE:   Note: Objective measures were completed at Evaluation unless otherwise noted.  DIAGNOSTIC FINDINGS:  1. Prior rotator cuff repair. Moderate supraspinatus tendinosis. 2. High-grade partial, near complete, tear of the subscapularis tendon. 3. No labral tear.  PATIENT SURVEYS:  PSFS: THE PATIENT SPECIFIC FUNCTIONAL SCALE  Place score of 0-10 (0 = unable to perform activity and 10 = able to perform activity at the same level as before injury or problem)  Activity Date: 02/12/2024    Swimming 1    2.  Lifting the arm straight out 1    3.  Shoulder press at the gym 0    4.  Yoga 3    Total Score 1.25      Total Score = Sum of activity scores/number of activities  Minimally Detectable Change: 3 points (for single activity); 2 points (for average score)   COGNITION: 02/12/2024 Overall cognitive status: Within functional limits for tasks assessed     SENSATION: 02/12/2024 [facility]  POSTURE: 02/12/2024 No significant postural issues noted  UPPER EXTREMITY ROM:   ROM Left/Right 02/12/2024 PROM Left 03/04/2024 PROM Left 03/11/2024 In supine  Shoulder flexion 105/175 130 140 AAROM  Shoulder extension     Shoulder abduction   112 AAROM in supine  Shoulder horizontal adduction 0/35    Shoulder internal rotation 50/70 40   Shoulder external rotation 30/105 45 52 AAROM in supine 45 deg abduction.   Elbow flexion     Elbow extension     Wrist flexion     Wrist extension     Wrist ulnar deviation     Wrist radial deviation     Wrist pronation     Wrist supination     (Blank rows = not tested)  UPPER EXTREMITY STRENGTH: Deferred due to being 72 hours  post-surgery      Shoulder flexion    Shoulder extension    Shoulder abduction    Shoulder adduction    Shoulder internal rotation    Shoulder external rotation    Middle trapezius    Lower trapezius    Elbow flexion    Elbow extension    Wrist flexion    Wrist extension    Wrist ulnar deviation    Wrist radial deviation    Wrist pronation    Wrist supination    Grip strength (lbs)    (Blank rows = not tested)  TREATMENT         DATE: 03/11/2024 Therex: Sidelying Lt shoulder ER with towel under arm AROM 2 x 15  Supine Lt shoulder protraction in 90 deg fleixon 3 sec hold 2 x 15  Supine Lt shoulder ER/IR AROM 5 sec hold to tolerance with towel under arm in approx. 70 deg abduction  Supine isometric 5 sec hold x 10 with arm at side painfree for Lt shoulder flexion, abduction.    TherActivity(to improve functional reach) Supine AAROM 1 lb bar flexion to tolerance x 15 Supine AROM Lt shoulder flexion 2 x 15    Manual: Supine Lt shoulder flexion, scaption, abduction c distraction mobilization with movement.  G2 inferior glides.  Mobilization c movement passive ER to tolerance Lt shoulder with posterior sustained glide.     TREATMENT         DATE: 03/08/24 -Codman's/pendulums: Forward and back; side-to-side; clockwise and counterclockwise 20 times each.  Focus on letting the arm relax and let the body control the motion  -Supine IR stretch supine, elbow 70 degrees to side, reach towards pants pocket 10 x 10 seconds, should be no pain with this activity -Empty hand biceps curl with supination (to minimize atrophy) 10 x 3 seconds - Pro /sup at elbow 10x  Avoid aggressive shoulder extension and ER to minimize strain on the subscapularis revision.  Functional Activities: -Scapular retractions/ shrugs  2x 10 x 5 seconds (Postural correction and scapular  strengthening) -Serratus anterior punch/Supine arm raise 10 x 3 seconds (Right side can help) for reaching and scapular protraction -Supine shoulder flexion AAROM (right helps left) 10 x 10 seconds (protract 1st with left palm facing in)  -Manual- PROM  TREATMENT         DATE8/15/2025 -Codman's/pendulums: Forward and back; side-to-side; clockwise and counterclockwise 20 times each.  Focus on letting the arm relax and let the body control the motion  -Supine IR stretch supine, elbow 70 degrees to side, reach towards pants pocket 10 x 10 seconds, should be no pain with this activity -Empty hand biceps curl with supination (to minimize atrophy) 10 x 3 seconds  Avoid aggressive shoulder extension and ER to minimize strain on the subscapularis revision.  Functional Activities: -Scapular retraction/shoulder blade pinch 10 x 5 seconds (Postural correction and scapular strengthening) -Serratus anterior punch/Supine arm raise 10 x 3 seconds (Right side can help) for reaching and scapular protraction -Supine shoulder flexion AAROM (right helps left) 10 x 10 seconds (protract 1st with left palm facing in)  [id number]:  Reviewed with Kent Johnson that it is okay to go slow at this point in his post-revision physical therapy.  Updated and reviewed his home exercise program.   TREATMENT         DATE8/08/25 PROM/stretching L shoulder forward elevation, scaption, very gentle ER to tolerance; avoided IR to protect subscap repair  AAROM supine flexion x12 with cane Supine chest press with cane x12  Gentle shoulder flexion isometrics, ABD isometrics, bicep flexion isometrics x12 each   Education on anatomy of subscap repair and clinical reasoning for precautions, clinical progressions, biomechanical forces on subscap and on rotator cuff and when with shoulder movements/why certain movements are still safe to work on in context of multiple subscap surgeries    PATIENT EDUCATION: Education details: See above Person  educated: Patient Education method: Programmer, multimedia, Demonstration, Actor cues, Verbal cues, and Handouts Education comprehension: verbalized understanding, returned demonstration, verbal cues required, tactile cues required, and needs further education  HOME EXERCISE PROGRAM: Access Code:  [email or web address] URL: [email or web address] Date: 03/04/2024 Prepared by: Lamar Ivory  Exercises - Pendulums  - 3 x daily - 7 x weekly - 1 sets - 20-30 reps - Standing Scapular Retraction  - 5 x daily - 7 x weekly - 1 sets - 5 reps - 5 second hold - Isometric Shoulder Flexion at Wall  - 1 x daily - 7 x weekly - 1-2 sets - 10 reps - Isometric Shoulder Abduction at Wall  - 1 x daily - 7 x weekly - 1-2 sets - 10 reps - Seated Isometric Elbow Flexion  - 1 x daily - 7 x weekly - 1-2 sets - 10 reps - Supine Scapular Protraction in Flexion with Dumbbells  - 1 x daily - 7 x weekly - 1 sets - 20 reps - 3 seconds hold - Supine Shoulder Flexion PROM  - 1 x daily - 7 x weekly - 1 sets - 10 reps - 5 seconds hold   ASSESSMENT:  CLINICAL IMPRESSION: Per MD note, progressed to include AAROM, AROM without resistance in elevation movements with good overall success today.  Range continued to show improvements.   OBJECTIVE IMPAIRMENTS: decreased activity tolerance, decreased endurance, decreased knowledge of condition, decreased ROM, decreased strength, decreased safety awareness, increased edema, impaired perceived functional ability, impaired UE functional use, and pain.   ACTIVITY LIMITATIONS: carrying, lifting, dressing, and reach over head  PARTICIPATION LIMITATIONS: cleaning, driving, community activity, occupation, and yard work  PERSONAL FACTORS: Left biceps tenodesis, left subscapularis repair 2023, left clavicle fracture 2011 are also affecting patient's functional outcome.   REHAB POTENTIAL: Good  CLINICAL DECISION MAKING: Evolving/moderate complexity  EVALUATION COMPLEXITY:  Moderate   GOALS: Goals reviewed with patient? Yes  SHORT TERM GOALS: Target date: 03/25/2024  Kent Johnson will be independent with his day 1 home exercise program Baseline: Started 02/12/2024 Goal status: Met 03/04/2024  2.  Improve left shoulder passive range of motion for flexion to 135 degrees; internal rotation to 60 degrees; external rotation to 30 degrees and horizontal adduction to 30 degrees Baseline: 105; 50; 30 and 0 respectively Goal status: Partially met 03/04/2024   LONG TERM GOALS: Target date: 05/06/2024  Improve patient specific functional score to at least 6 Baseline: 1.25 Goal status: INITIAL  2.  Kent Johnson will be able to maintain left shoulder pain at a level of 2/10 or better on the visual analog scale Baseline: 2/10 72 hours post-surgery Goal status: INITIAL  3.  Improve left shoulder active range of motion to 90% or better of the uninvolved right Baseline: See objective Goal status: INITIAL  4.  Improve left shoulder strength for internal/external rotation to 60% of the uninvolved right at 12 weeks postsurgery (9 months to a year expected for 90% plus strength after the revision) Baseline: Deferred secondary to surgery 72 hours ago Goal status: INITIAL  5.  Kent Johnson will be independent with his long-term maintenance home exercise program at discharge Baseline: Started 02/12/2024 Goal status: INITIAL   PLAN:  PT FREQUENCY: 2x/week  PT DURATION: 12 weeks  PLANNED INTERVENTIONS: 97750- Physical Performance Testing, 97110-Therapeutic exercises, 97530- Therapeutic activity, 97112- Neuromuscular re-education, 97535- Self Care, [id number]- Manual therapy, [id number]- Electrical stimulation (unattended), [id number]- Vasopneumatic device, Patient/Family education, Joint mobilization, and Cryotherapy  PLAN FOR NEXT SESSION: Per MD, continue progression of AAROM/AROM in elevation, ER active.  AVOID IR against weight, hand behind back.   Check on BCBS authorization information from eval.       02/26/24- per MD note from 02/24/24 (updated surgical precautions)  2 weeks status post left shoulder subscapularis repair open overall doing very well. At today's visit I did describe that I would recommend allowed active forward elevation in the scapular plane as well as external rotation. I did describe that mildly restriction would be no internal rotation against weight or no going behind the back. Other than that he may begin active forward elevation as well as external rotation   Ozell Silvan, PT, DPT, OCS, ATC 03/11/24  9:27 AM

## 2024-03-11 ENCOUNTER — Encounter: Payer: Self-pay | Admitting: Rehabilitative and Restorative Service Providers"

## 2024-03-11 ENCOUNTER — Ambulatory Visit (INDEPENDENT_AMBULATORY_CARE_PROVIDER_SITE_OTHER): Admitting: Rehabilitative and Restorative Service Providers"

## 2024-03-11 DIAGNOSIS — M25612 Stiffness of left shoulder, not elsewhere classified: Secondary | ICD-10-CM

## 2024-03-11 DIAGNOSIS — M6281 Muscle weakness (generalized): Secondary | ICD-10-CM | POA: Diagnosis not present

## 2024-03-11 DIAGNOSIS — R600 Localized edema: Secondary | ICD-10-CM

## 2024-03-11 DIAGNOSIS — M25512 Pain in left shoulder: Secondary | ICD-10-CM

## 2024-03-14 ENCOUNTER — Encounter (HOSPITAL_BASED_OUTPATIENT_CLINIC_OR_DEPARTMENT_OTHER): Admitting: Physical Therapy

## 2024-03-15 ENCOUNTER — Encounter: Admitting: Physical Therapy

## 2024-03-16 ENCOUNTER — Encounter: Payer: Self-pay | Admitting: Physical Therapy

## 2024-03-16 ENCOUNTER — Encounter (HOSPITAL_BASED_OUTPATIENT_CLINIC_OR_DEPARTMENT_OTHER): Admitting: Physical Therapy

## 2024-03-16 ENCOUNTER — Ambulatory Visit (INDEPENDENT_AMBULATORY_CARE_PROVIDER_SITE_OTHER): Admitting: Physical Therapy

## 2024-03-16 DIAGNOSIS — M6281 Muscle weakness (generalized): Secondary | ICD-10-CM

## 2024-03-16 DIAGNOSIS — M25512 Pain in left shoulder: Secondary | ICD-10-CM

## 2024-03-16 DIAGNOSIS — M25612 Stiffness of left shoulder, not elsewhere classified: Secondary | ICD-10-CM | POA: Diagnosis not present

## 2024-03-16 DIAGNOSIS — R600 Localized edema: Secondary | ICD-10-CM

## 2024-03-16 NOTE — Therapy (Signed)
 OUTPATIENT PHYSICAL THERAPY TREATMENT    Patient Name: Kent Johnson MRN: [id number] DOB:07/22/1985, 38 y.o., male Today's Date: 03/16/2024  END OF SESSION:  PT End of Session - 03/16/24 0927     Visit Number 6    Number of Visits 24    Date for PT Re-Evaluation 05/06/24    Authorization Type BCBS Auth needed    Authorization - Visit Number 6    Authorization - Number of Visits 24    PT Start Time 0845    PT Stop Time 0925    PT Time Calculation (min) 40 min    Activity Tolerance Patient tolerated treatment well    Behavior During Therapy [facility] for tasks assessed/performed               Past Medical History:  Diagnosis Date   [place]   [place]    Arthritis 2021   rt knee   Routine general medical examination at a health care facility 04/08/2023   Past Surgical History:  Procedure Laterality Date   BICEPT TENODESIS Left 02/09/2024   Procedure: RIGHT PECTORAL BICEPS TENODESIS / POSSIBLE DERMAL AUGMENTATION;  Surgeon: Genelle Standing, MD;  Location: [facility];  Service: Orthopedics;  Laterality: Left;   CLAVICLE SURGERY  2011   broke collar bone skiing   COLON SURGERY  around 2007   benign lipoma inside of colon lead to intusseption   LIPOMA EXCISION  2007   benign lipoma in colon, caused intersepcion   OPEN SUBSCAPULARIS REPAIR Left 02/09/2024   Procedure: LEFT SHOULDER OPEN SUBSCAPULARIS REPAIR;  Surgeon: Genelle Standing, MD;  Location: [facility];  Service: Orthopedics;  Laterality: Left;   SHOULDER ARTHROSCOPY Left 02/09/2024   Procedure: ARTHROSCOPY, SHOULDER;  Surgeon: Genelle Standing, MD;  Location: [facility];  Service: Orthopedics;  Laterality: Left;   SHOULDER ARTHROSCOPY WITH DEBRIDEMENT AND BICEP TENDON REPAIR Left 03/11/2022   Procedure: LEFT SHOULDER ARTHROSCOPY WITH MINI OPEN SUBSCAPULARIS REPAIR  AND BICEP TENDODESIS;  Surgeon: Genelle Standing, MD;  Location: [facility];  Service: Orthopedics;  Laterality: Left;   WOUND EXPLORATION N/A 05/23/2022   Procedure: WOUND EXPLORATION WITH REMOVAL OF FOREIGN BODY OF ABDOMINAL WALL;  Surgeon: Eletha Boas, MD;  Location: [facility];  Service: General;  Laterality: N/A;   Patient Active Problem List   Diagnosis Date Noted   Routine general medical examination at a health care facility 04/08/2023   Foreign body in anterior abdominal wall 05/18/2022   Avulsion of left subscapularis    Anxiety disorder 11/11/2021   Insomnia 11/11/2021    PCP: [facility]  REFERRING PROVIDER: Standing Genelle, MD  REFERRING DIAG: S46.892A (ICD-10-CM) - Avulsion of left subscapularis, initial encounter  THERAPY DIAG:  Muscle weakness (generalized)  Stiffness of left shoulder, not elsewhere classified  Acute pain of left shoulder  Localized edema  Rationale for Evaluation and Treatment: Rehabilitation  ONSET DATE: 02/09/2024  SUBJECTIVE:  SUBJECTIVE STATEMENT: Doing pretty well; having some occasional twinges but otherwise no pain   Hand dominance: Left  PERTINENT HISTORY:  Left biceps tenodesis, left subscapularis repair 2023, left clavicle fracture 2011  PAIN:  NPRS scale: 1/10 Pain location: Lt pectoralis  Pain description: Slight ache/cramp  Aggravating factors: [place] exercise Relieving factors: NA  PRECAUTIONS: Shoulder-revision.  This is [name]'s second subscapularis repair on the left side so we will need to be conservative with starting strengthening given his revision status.  Recommend holding off until the end of week 10 post-surgery before beginning any resisted strength work.  RED FLAGS: None   WEIGHT BEARING RESTRICTIONS: Yes No left upper extremity weight-bearing  FALLS:  Has patient fallen in last 6  months? No  LIVING ENVIRONMENT: Lives with: lives with their family, lives with their spouse, and with 3 children Lives in: House/apartment Stairs: No problems Has following equipment at home: Lt shoulder brace  OCCUPATION: Sport and exercise psychologist, power infrastructure  PLOF: Independent  PATIENT GOALS:Return to swimming, yoga and ADLs without restrictions  NEXT MD VISIT: March 23, 2024  OBJECTIVE:   Note: Objective measures were completed at Evaluation unless otherwise noted.  DIAGNOSTIC FINDINGS:  1. Prior rotator cuff repair. Moderate supraspinatus tendinosis. 2. High-grade partial, near complete, tear of the subscapularis tendon. 3. No labral tear.  PATIENT SURVEYS:  PSFS: THE PATIENT SPECIFIC FUNCTIONAL SCALE  Place score of 0-10 (0 = unable to perform activity and 10 = able to perform activity at the same level as before injury or problem)  Activity Date: 02/12/2024    Swimming 1    2.  Lifting the arm straight out 1    3.  Shoulder press at the gym 0    4.  Yoga 3    Total Score 1.25      Total Score = Sum of activity scores/number of activities  Minimally Detectable Change: 3 points (for single activity); 2 points (for average score)   COGNITION: 02/12/2024 Overall cognitive status: Within functional limits for tasks assessed     SENSATION: 02/12/2024 [facility]  POSTURE: 02/12/2024 No significant postural issues noted  UPPER EXTREMITY ROM:   ROM Left/Right 02/12/2024 PROM Left 03/04/2024 PROM Left 03/11/2024 In supine  Shoulder flexion 105/175 130 140 AAROM  Shoulder extension     Shoulder abduction   112 AAROM in supine  Shoulder horizontal adduction 0/35    Shoulder internal rotation 50/70 40   Shoulder external rotation 30/105 45 52 AAROM in supine 45 deg abduction.   (Blank rows = not tested)  UPPER EXTREMITY STRENGTH: Deferred due to being 72 hours post-surgery      Shoulder flexion    Shoulder extension    Shoulder abduction    Shoulder  adduction    Shoulder internal rotation    Shoulder external rotation    Middle trapezius    Lower trapezius    Elbow flexion    Elbow extension    Wrist flexion    Wrist extension    Wrist ulnar deviation    Wrist radial deviation    Wrist pronation    Wrist supination    Grip strength (lbs)    (Blank rows = not tested)  TREATMENT 03/16/24 TherAct Supine shoulder flexion 2x10 with 3 sec hold  Supine protraction 2x10; 3 sec hold; 2# weight Sidelying ER on Lt 2x10 Sidelying abduction on Lt 2x10 Wall ladder flexion/scaption 2x10 Rows L3 band 2x10; 3 sec hold Standing shoulder isometrics 20x5 sec hold: flexion, IR   03/11/2024 Therex: Sidelying Lt shoulder ER with towel under arm AROM 2 x 15  Supine Lt shoulder protraction in 90 deg fleixon 3 sec hold 2 x 15  Supine Lt shoulder ER/IR AROM 5 sec hold to tolerance with towel under arm in approx. 70 deg abduction  Supine isometric 5 sec hold x 10 with arm at side painfree for Lt shoulder flexion, abduction.    TherActivity(to improve functional reach) Supine AAROM 1 lb bar flexion to tolerance x 15 Supine AROM Lt shoulder flexion 2 x 15    Manual: Supine Lt shoulder flexion, scaption, abduction c distraction mobilization with movement.  G2 inferior glides.  Mobilization c movement passive ER to tolerance Lt shoulder with posterior sustained glide.    03/08/24 -Codman's/pendulums: Forward and back; side-to-side; clockwise and counterclockwise 20 times each.  Focus on letting the arm relax and let the body control the motion  -Supine IR stretch supine, elbow 70 degrees to side, reach towards pants pocket 10 x 10 seconds, should be no pain with this activity -Empty hand biceps curl with supination (to minimize atrophy) 10 x 3 seconds - Pro /sup at elbow 10x  Avoid aggressive shoulder extension and ER to minimize  strain on the subscapularis revision.  Functional Activities: -Scapular retractions/ shrugs  2x 10 x 5 seconds (Postural correction and scapular strengthening) -Serratus anterior punch/Supine arm raise 10 x 3 seconds (Right side can help) for reaching and scapular protraction -Supine shoulder flexion AAROM (right helps left) 10 x 10 seconds (protract 1st with left palm facing in)  -Manual- PROM  03/04/2024 -Codman's/pendulums: Forward and back; side-to-side; clockwise and counterclockwise 20 times each.  Focus on letting the arm relax and let the body control the motion  -Supine IR stretch supine, elbow 70 degrees to side, reach towards pants pocket 10 x 10 seconds, should be no pain with this activity -Empty hand biceps curl with supination (to minimize atrophy) 10 x 3 seconds  Avoid aggressive shoulder extension and ER to minimize strain on the subscapularis revision.  Functional Activities: -Scapular retraction/shoulder blade pinch 10 x 5 seconds (Postural correction and scapular strengthening) -Serratus anterior punch/Supine arm raise 10 x 3 seconds (Right side can help) for reaching and scapular protraction -Supine shoulder flexion AAROM (right helps left) 10 x 10 seconds (protract 1st with left palm facing in)  [id number]:  Reviewed with [name] that it is okay to go slow at this point in his post-revision physical therapy.  Updated and reviewed his home exercise program.   PATIENT EDUCATION: Education details: See above Person educated: Patient Education method: Explanation, Demonstration, Tactile cues, Verbal cues, and Handouts Education comprehension: verbalized understanding, returned demonstration, verbal cues required, tactile cues required, and needs further education  HOME EXERCISE PROGRAM: Access Code: [email or web address] URL: [email or web address] Date: 03/04/2024 Prepared by: Lamar Ivory  Exercises - Pendulums  - 3 x daily - 7 x weekly - 1 sets - 20-30 reps -  Standing Scapular Retraction  - 5 x daily - 7 x weekly - 1 sets - 5 reps - 5 second hold - Isometric Shoulder Flexion at Wall  - 1 x daily - 7 x weekly - 1-2 sets - 10 reps - Isometric Shoulder Abduction  at Wall  - 1 x daily - 7 x weekly - 1-2 sets - 10 reps - Seated Isometric Elbow Flexion  - 1 x daily - 7 x weekly - 1-2 sets - 10 reps - Supine Scapular Protraction in Flexion with Dumbbells  - 1 x daily - 7 x weekly - 1 sets - 20 reps - 3 seconds hold - Supine Shoulder Flexion PROM  - 1 x daily - 7 x weekly - 1 sets - 10 reps - 5 seconds hold   ASSESSMENT:  CLINICAL IMPRESSION: Good tolerance to progression of AROM/AAROM exercises today with expected muscle soreness.  Overall progressing well with PT, continue skilled PT to maximize function.  OBJECTIVE IMPAIRMENTS: decreased activity tolerance, decreased endurance, decreased knowledge of condition, decreased ROM, decreased strength, decreased safety awareness, increased edema, impaired perceived functional ability, impaired UE functional use, and pain.   ACTIVITY LIMITATIONS: carrying, lifting, dressing, and reach over head  PARTICIPATION LIMITATIONS: cleaning, driving, community activity, occupation, and yard work  PERSONAL FACTORS: Left biceps tenodesis, left subscapularis repair 2023, left clavicle fracture 2011 are also affecting patient's functional outcome.   REHAB POTENTIAL: Good  CLINICAL DECISION MAKING: Evolving/moderate complexity  EVALUATION COMPLEXITY: Moderate   GOALS: Goals reviewed with patient? Yes  SHORT TERM GOALS: Target date: 03/25/2024  [name] will be independent with his day 1 home exercise program Baseline: Started 02/12/2024 Goal status: Met 03/04/2024  2.  Improve left shoulder passive range of motion for flexion to 135 degrees; internal rotation to 60 degrees; external rotation to 30 degrees and horizontal adduction to 30 degrees Baseline: 105; 50; 30 and 0 respectively Goal status: Partially met  03/04/2024   LONG TERM GOALS: Target date: 05/06/2024  Improve patient specific functional score to at least 6 Baseline: 1.25 Goal status: INITIAL  2.  [name] will be able to maintain left shoulder pain at a level of 2/10 or better on the visual analog scale Baseline: 2/10 72 hours post-surgery Goal status: INITIAL  3.  Improve left shoulder active range of motion to 90% or better of the uninvolved right Baseline: See objective Goal status: INITIAL  4.  Improve left shoulder strength for internal/external rotation to 60% of the uninvolved right at 12 weeks postsurgery (9 months to a year expected for 90% plus strength after the revision) Baseline: Deferred secondary to surgery 72 hours ago Goal status: INITIAL  5.  [name] will be independent with his long-term maintenance home exercise program at discharge Baseline: Started 02/12/2024 Goal status: INITIAL   PLAN:  PT FREQUENCY: 2x/week  PT DURATION: 12 weeks  PLANNED INTERVENTIONS: 97750- Physical Performance Testing, 97110-Therapeutic exercises, 97530- Therapeutic activity, 97112- Neuromuscular re-education, 97535- Self Care, [id number]- Manual therapy, [id number]- Electrical stimulation (unattended), [id number]- Vasopneumatic device, Patient/Family education, Joint mobilization, and Cryotherapy  PLAN FOR NEXT SESSION: will need MD note, continue progression of AAROM/AROM in elevation, ER active.  AVOID IR against weight, hand behind back.   Check on BCBS authorization information from eval.  03/16/24: Front office aware, submitting for auth     02/26/24- per MD note from 02/24/24 (updated surgical precautions)  2 weeks status post left shoulder subscapularis repair open overall doing very well. At today's visit I did describe that I would recommend allowed active forward elevation in the scapular plane as well as external rotation. I did describe that mildly restriction would be no internal rotation against weight or no going behind the back.  Other than that he may begin active forward elevation as well as external  rotation    Corean JULIANNA Ku, PT, DPT 03/16/24 9:28 AM

## 2024-03-18 ENCOUNTER — Encounter: Payer: Self-pay | Admitting: Rehabilitative and Restorative Service Providers"

## 2024-03-18 ENCOUNTER — Ambulatory Visit (INDEPENDENT_AMBULATORY_CARE_PROVIDER_SITE_OTHER): Admitting: Rehabilitative and Restorative Service Providers"

## 2024-03-18 DIAGNOSIS — M25612 Stiffness of left shoulder, not elsewhere classified: Secondary | ICD-10-CM

## 2024-03-18 DIAGNOSIS — R600 Localized edema: Secondary | ICD-10-CM | POA: Diagnosis not present

## 2024-03-18 DIAGNOSIS — M25512 Pain in left shoulder: Secondary | ICD-10-CM | POA: Diagnosis not present

## 2024-03-18 DIAGNOSIS — M6281 Muscle weakness (generalized): Secondary | ICD-10-CM

## 2024-03-18 NOTE — Therapy (Signed)
 OUTPATIENT PHYSICAL THERAPY TREATMENT/PROGRESS NOTE  Progress Note Reporting Period 02/12/2024 to 03/18/2024  See note below for Objective Data and Assessment of Progress/Goals.      Patient Name: Kent Johnson MRN: [id number] DOB:Feb 10, 1986, 38 y.o., male Today's Date: 03/18/2024  END OF SESSION:  PT End of Session - 03/18/24 1602     Visit Number 7    Number of Visits 24    Date for PT Re-Evaluation 05/06/24    Authorization Type BCBS Auth needed    Authorization - Number of Visits 24    PT Start Time [phone]    PT Stop Time 0932    PT Time Calculation (min) 45 min    Activity Tolerance Patient tolerated treatment well;No increased pain    Behavior During Therapy [facility] for tasks assessed/performed                Past Medical History:  Diagnosis Date   [place]   [place]    Arthritis 2021   rt knee   Routine general medical examination at a health care facility 04/08/2023   Past Surgical History:  Procedure Laterality Date   BICEPT TENODESIS Left 02/09/2024   Procedure: RIGHT PECTORAL BICEPS TENODESIS / POSSIBLE DERMAL AUGMENTATION;  Surgeon: Genelle Standing, MD;  Location: [facility];  Service: Orthopedics;  Laterality: Left;   CLAVICLE SURGERY  2011   broke collar bone skiing   COLON SURGERY  around 2007   benign lipoma inside of colon lead to intusseption   LIPOMA EXCISION  2007   benign lipoma in colon, caused intersepcion   OPEN SUBSCAPULARIS REPAIR Left 02/09/2024   Procedure: LEFT SHOULDER OPEN SUBSCAPULARIS REPAIR;  Surgeon: Genelle Standing, MD;  Location: [facility];  Service: Orthopedics;  Laterality: Left;   SHOULDER ARTHROSCOPY Left 02/09/2024   Procedure: ARTHROSCOPY, SHOULDER;  Surgeon: Genelle Standing, MD;  Location: [facility];  Service: Orthopedics;  Laterality: Left;   SHOULDER ARTHROSCOPY WITH DEBRIDEMENT AND BICEP TENDON REPAIR Left 03/11/2022   Procedure: LEFT SHOULDER  ARTHROSCOPY WITH MINI OPEN SUBSCAPULARIS REPAIR  AND BICEP TENDODESIS;  Surgeon: Genelle Standing, MD;  Location: [facility];  Service: Orthopedics;  Laterality: Left;   WOUND EXPLORATION N/A 05/23/2022   Procedure: WOUND EXPLORATION WITH REMOVAL OF FOREIGN BODY OF ABDOMINAL WALL;  Surgeon: Eletha Boas, MD;  Location: [facility];  Service: General;  Laterality: N/A;   Patient Active Problem List   Diagnosis Date Noted   Routine general medical examination at a health care facility 04/08/2023   Foreign body in anterior abdominal wall 05/18/2022   Avulsion of left subscapularis    Anxiety disorder 11/11/2021   Insomnia 11/11/2021    PCP: [facility]  REFERRING PROVIDER: Standing Genelle, MD  REFERRING DIAG: S46.892A (ICD-10-CM) - Avulsion of left subscapularis, initial encounter  THERAPY DIAG:  Muscle weakness (generalized)  Stiffness of left shoulder, not elsewhere classified  Acute pain of left shoulder  Localized edema  Rationale for Evaluation and Treatment: Rehabilitation  ONSET DATE: 02/09/2024  SUBJECTIVE:  SUBJECTIVE STATEMENT: Kent Johnson continues to report good home exercise program compliance.  He is not having too much difficulty at night and has been wearing his sling as prescribed.  Hand dominance: Left  PERTINENT HISTORY:  Left biceps tenodesis, left subscapularis repair 2023, left clavicle fracture 2011  PAIN:  NPRS scale: 1-2/10 this week Pain location: Lt pectoralis and anterior shoulder Pain description: Slight ache/cramp  Aggravating factors: Overuse Relieving factors: Rest and exercise  PRECAUTIONS: Shoulder-revision.  This is Kent Johnson's second subscapularis repair on the left side so we will need to be conservative with starting strengthening given his  revision status.  Recommend holding off until the end of week 10 post-surgery before beginning any resisted strength work.  RED FLAGS: None   WEIGHT BEARING RESTRICTIONS: Yes No left upper extremity weight-bearing  FALLS:  Has patient fallen in last 6 months? No  LIVING ENVIRONMENT: Lives with: lives with their family, lives with their spouse, and with 3 children Lives in: House/apartment Stairs: No problems Has following equipment at home: Lt shoulder brace  OCCUPATION: Sport and exercise psychologist, power infrastructure  PLOF: Independent  PATIENT GOALS:Return to swimming, yoga and ADLs without restrictions  NEXT MD VISIT: March 23, 2024  OBJECTIVE:   Note: Objective measures were completed at Evaluation unless otherwise noted.  DIAGNOSTIC FINDINGS:  1. Prior rotator cuff repair. Moderate supraspinatus tendinosis. 2. High-grade partial, near complete, tear of the subscapularis tendon. 3. No labral tear.  PATIENT SURVEYS:  PSFS: THE PATIENT SPECIFIC FUNCTIONAL SCALE  Place score of 0-10 (0 = unable to perform activity and 10 = able to perform activity at the same level as before injury or problem)  Activity Date: 02/12/2024    Swimming 1    2.  Lifting the arm straight out 1    3.  Shoulder press at the gym 0    4.  Yoga 3    Total Score 1.25      Total Score = Sum of activity scores/number of activities  Minimally Detectable Change: 3 points (for single activity); 2 points (for average score)   COGNITION: 02/12/2024 Overall cognitive status: Within functional limits for tasks assessed     SENSATION: 02/12/2024 [facility]  POSTURE: 02/12/2024 No significant postural issues noted  UPPER EXTREMITY ROM:   ROM Left/Right 02/12/2024 PROM Left 03/04/2024 PROM Left 03/11/2024 In supine Left 03/18/2024  Shoulder flexion 105/175 130 140 AAROM 150  Shoulder extension      Shoulder abduction   112 AAROM in supine   Shoulder horizontal adduction 0/35   30  Shoulder internal  rotation 50/70 40  40  Shoulder external rotation 30/105 45 52 AAROM in supine 45 deg abduction.  60  (Blank rows = not tested)  UPPER EXTREMITY STRENGTH: Deferred due to being 72 hours post-surgery      Shoulder flexion    Shoulder extension    Shoulder abduction    Shoulder adduction    Shoulder internal rotation    Shoulder external rotation    Middle trapezius    Lower trapezius    Elbow flexion    Elbow extension    Wrist flexion    Wrist extension    Wrist ulnar deviation    Wrist radial deviation    Wrist pronation    Wrist supination    Grip strength (lbs)    (Blank rows = not tested)  TREATMENT 03/18/2024 Supine IR stretch supine, elbow 70 degrees to side, reach towards pants pocket 20 x 10 seconds, should be no pain with this activity Biceps curls with supination with 1# 20 x 3 seconds Side-lie ER (anti-gravity) 10 x 3 seconds  Avoid aggressive shoulder extension and ER to minimize strain on the subscapularis revision.  Functional Activities: Scapular retraction/shoulder blade pinches 20 x 5 seconds (Postural correction and scapular strengthening) Serratus anterior punch/Supine arm raise 20 x 3 seconds for reaching and scapular protraction Supine shoulder flexion AROM 10 x 10 seconds (protract 1st with left palm facing in)   03/16/24 TherAct Supine shoulder flexion 2x10 with 3 sec hold  Supine protraction 2x10; 3 sec hold; 2# weight Sidelying ER on Lt 2x10 Sidelying abduction on Lt 2x10 Wall ladder flexion/scaption 2x10 Rows L3 band 2x10; 3 sec hold Standing shoulder isometrics 20x5 sec hold: flexion, IR   03/11/2024 Therex: Sidelying Lt shoulder ER with towel under arm AROM 2 x 15  Supine Lt shoulder protraction in 90 deg fleixon 3 sec hold 2 x 15  Supine Lt shoulder ER/IR AROM 5 sec hold to tolerance with towel under arm in approx. 70 deg  abduction  Supine isometric 5 sec hold x 10 with arm at side painfree for Lt shoulder flexion, abduction.    TherActivity(to improve functional reach) Supine AAROM 1 lb bar flexion to tolerance x 15 Supine AROM Lt shoulder flexion 2 x 15    Manual: Supine Lt shoulder flexion, scaption, abduction c distraction mobilization with movement.  G2 inferior glides.  Mobilization c movement passive ER to tolerance Lt shoulder with posterior sustained glide.    PATIENT EDUCATION: Education details: See above Person educated: Patient Education method: Explanation, Demonstration, Tactile cues, Verbal cues, and Handouts Education comprehension: verbalized understanding, returned demonstration, verbal cues required, tactile cues required, and needs further education  HOME EXERCISE PROGRAM: Access Code: [email or web address] URL: [email or web address] Date: 03/18/2024 Prepared by: Lamar Ivory  Exercises - Standing Scapular Retraction  - 5 x daily - 7 x weekly - 1 sets - 5 reps - 5 second hold - Supine Scapular Protraction in Flexion with Dumbbells  - 2 x daily - 7 x weekly - 1 sets - 20 reps - 3 seconds hold - Supine Shoulder Flexion PROM  - 1 x daily - 7 x weekly - 1 sets - 10 reps - 10 seconds hold - Supine Shoulder Internal Rotation Stretch  - 2 x daily - 7 x weekly - 1 sets - 10-20 reps - 10 seconds hold - Standing Bicep Curls with Dumbbells and Rotation  - 1-2 x daily - 7 x weekly - 1 sets - 20 reps - 3 seconds hold - Sidelying Shoulder External Rotation Dumbbell  - 1 x daily - 7 x weekly - 1-2 sets - 10 reps - 3 second hold  ASSESSMENT:  CLINICAL IMPRESSION: Kent Johnson continues to do well 5-1/2 weeks post left subscapularis revision.  He follows up with Dr. Genelle next week and will likely be out of his sling.  Due to this being a revision surgery, I have Kent Johnson on a very conservative protocol and I am recommending holding off on strengthening until the end of week 10.  Kent Johnson will continue  to work on active range of motion, capsular flexibility and functional scapular and shoulder activities to make it easier for a more rapid and safe transition into normal function.  He remains on track to meet long-term goals established at evaluation.  OBJECTIVE IMPAIRMENTS: decreased activity  tolerance, decreased endurance, decreased knowledge of condition, decreased ROM, decreased strength, decreased safety awareness, increased edema, impaired perceived functional ability, impaired UE functional use, and pain.   ACTIVITY LIMITATIONS: carrying, lifting, dressing, and reach over head  PARTICIPATION LIMITATIONS: cleaning, driving, community activity, occupation, and yard work  PERSONAL FACTORS: Left biceps tenodesis, left subscapularis repair 2023, left clavicle fracture 2011 are also affecting patient's functional outcome.   REHAB POTENTIAL: Good  CLINICAL DECISION MAKING: Evolving/moderate complexity  EVALUATION COMPLEXITY: Moderate   GOALS: Goals reviewed with patient? Yes  SHORT TERM GOALS: Target date: 03/25/2024  Kent Johnson will be independent with his day 1 home exercise program Baseline: Started 02/12/2024 Goal status: Met 03/04/2024  2.  Improve left shoulder passive range of motion for flexion to 135 degrees; internal rotation to 60 degrees; external rotation to 30 degrees and horizontal adduction to 30 degrees Baseline: 105; 50; 30 and 0 respectively Goal status: Mostly met 03/18/2024  LONG TERM GOALS: Target date: 05/06/2024  Improve patient specific functional score to at least 6 Baseline: 1.25 Goal status: INITIAL  2.  Kent Johnson will be able to maintain left shoulder pain at a level of 2/10 or better on the visual analog scale Baseline: 2/10 72 hours post-surgery Goal status: INITIAL  3.  Improve left shoulder active range of motion to 90% or better of the uninvolved right Baseline: See objective Goal status: INITIAL  4.  Improve left shoulder strength for  internal/external rotation to 60% of the uninvolved right at 12 weeks postsurgery (9 months to a year expected for 90% plus strength after the revision) Baseline: Deferred secondary to surgery 72 hours ago Goal status: INITIAL  5.  Kent Johnson will be independent with his long-term maintenance home exercise program at discharge Baseline: Started 02/12/2024 Goal status: INITIAL   PLAN:  PT FREQUENCY: 2x/week  PT DURATION: 12 weeks  PLANNED INTERVENTIONS: 97750- Physical Performance Testing, 97110-Therapeutic exercises, 97530- Therapeutic activity, 97112- Neuromuscular re-education, 97535- Self Care, 02859- Manual therapy, G0283- Electrical stimulation (unattended), 97016- Vasopneumatic device, Patient/Family education, Joint mobilization, and Cryotherapy  PLAN FOR NEXT SESSION: Continue functional scapular strength while avoiding unnecessary strain on the subscapularis.  Okay for progression of AAROM/AROM while avoiding excessive external rotation or extension.  AVOID IR against resistance and hand behind back activities for a bit longer.  Check on BCBS authorization information from eval.  03/16/24: Front office aware, submitting for auth     02/26/24- per MD note from 02/24/24 (updated surgical precautions)  2 weeks status post left shoulder subscapularis repair open overall doing very well. At today's visit I did describe that I would recommend allowed active forward elevation in the scapular plane as well as external rotation. I did describe that mildly restriction would be no internal rotation against weight or no going behind the back. Other than that he may begin active forward elevation as well as external rotation   Myer LELON Ivory PT, MPT 03/18/24 4:11 PM

## 2024-03-22 ENCOUNTER — Encounter: Admitting: Rehabilitative and Restorative Service Providers"

## 2024-03-23 ENCOUNTER — Encounter: Payer: Self-pay | Admitting: Physical Therapy

## 2024-03-23 ENCOUNTER — Ambulatory Visit (HOSPITAL_BASED_OUTPATIENT_CLINIC_OR_DEPARTMENT_OTHER): Payer: Self-pay

## 2024-03-23 ENCOUNTER — Ambulatory Visit (INDEPENDENT_AMBULATORY_CARE_PROVIDER_SITE_OTHER): Admitting: Orthopaedic Surgery

## 2024-03-23 ENCOUNTER — Ambulatory Visit (INDEPENDENT_AMBULATORY_CARE_PROVIDER_SITE_OTHER): Admitting: Physical Therapy

## 2024-03-23 DIAGNOSIS — M6281 Muscle weakness (generalized): Secondary | ICD-10-CM | POA: Diagnosis not present

## 2024-03-23 DIAGNOSIS — S46892A Other injury of other muscles, fascia and tendons at shoulder and upper arm level, left arm, initial encounter: Secondary | ICD-10-CM

## 2024-03-23 DIAGNOSIS — M25612 Stiffness of left shoulder, not elsewhere classified: Secondary | ICD-10-CM | POA: Diagnosis not present

## 2024-03-23 DIAGNOSIS — M25512 Pain in left shoulder: Secondary | ICD-10-CM

## 2024-03-23 DIAGNOSIS — R600 Localized edema: Secondary | ICD-10-CM

## 2024-03-23 MED ORDER — TRIAMCINOLONE ACETONIDE 40 MG/ML IJ SUSP
80.0000 mg | INTRAMUSCULAR | Status: AC | PRN
  Administered 2024-03-23: 80 mg via INTRA_ARTICULAR

## 2024-03-23 MED ORDER — LIDOCAINE HCL 1 % IJ SOLN
4.0000 mL | INTRAMUSCULAR | Status: AC | PRN
  Administered 2024-03-23: 4 mL

## 2024-03-23 NOTE — Progress Notes (Signed)
 Post Operative Evaluation    Procedure/Date of Surgery: Left shoulder subscapularis repair 7/22  Interval History:    Presents today 6 weeks status post left subscapularis repair doing well.  He is doing quite well with physical therapy.  There is some occasional clicking and tenderness in the [facility] joint   PMH/PSH/Family History/Social History/Meds/Allergies:    Past Medical History:  Diagnosis Date  . Allergy 2017   moved [place]  . Anxiety   . Arthritis 2021   rt knee  . Routine general medical examination at a health care facility 04/08/2023   Past Surgical History:  Procedure Laterality Date  . BICEPT TENODESIS Left 02/09/2024   Procedure: RIGHT PECTORAL BICEPS TENODESIS / POSSIBLE DERMAL AUGMENTATION;  Surgeon: Genelle Standing, MD;  Location: [facility];  Service: Orthopedics;  Laterality: Left;  . CLAVICLE SURGERY  2011   broke collar bone skiing  . COLON SURGERY  around 2007   benign lipoma inside of colon lead to intusseption  . LIPOMA EXCISION  2007   benign lipoma in colon, caused intersepcion  . OPEN SUBSCAPULARIS REPAIR Left 02/09/2024   Procedure: LEFT SHOULDER OPEN SUBSCAPULARIS REPAIR;  Surgeon: Genelle Standing, MD;  Location: [facility];  Service: Orthopedics;  Laterality: Left;  . SHOULDER ARTHROSCOPY Left 02/09/2024   Procedure: ARTHROSCOPY, SHOULDER;  Surgeon: Genelle Standing, MD;  Location: [facility];  Service: Orthopedics;  Laterality: Left;  . SHOULDER ARTHROSCOPY WITH DEBRIDEMENT AND BICEP TENDON REPAIR Left 03/11/2022   Procedure: LEFT SHOULDER ARTHROSCOPY WITH MINI OPEN SUBSCAPULARIS REPAIR  AND BICEP TENDODESIS;  Surgeon: Genelle Standing, MD;  Location: [facility];  Service: Orthopedics;  Laterality: Left;  . WOUND EXPLORATION N/A 05/23/2022   Procedure: WOUND EXPLORATION WITH REMOVAL OF FOREIGN BODY OF ABDOMINAL WALL;  Surgeon: Eletha Boas, MD;  Location: [facility];  Service: General;  Laterality: N/A;   Social History   Socioeconomic History  . Marital status: Married    Spouse name: Vernell  . Number of children: Not on file  . Years of education: Not on file  . Highest education level: Master's degree (e.g., MA, MS, MEng, MEd, MSW, MBA)  Occupational History  . Not on file  Tobacco Use  . Smoking status: Former    Types: Cigarettes  . Smokeless tobacco: Never  . Tobacco comments:    2 cig per year.  Vaping Use  . Vaping status: Not on file  Substance and Sexual Activity  . Alcohol use: Yes    Comment: occas  . Drug use: Never  . Sexual activity: Yes    Partners: Female  Other Topics Concern  . Not on file  Social History Narrative  . Not on file   Social Drivers of Health   Financial Resource Strain: Low Risk  (08/30/2023)   Overall Financial Resource Strain (CARDIA)   . Difficulty of Paying Living Expenses: Not hard at all  Food Insecurity: No Food Insecurity (08/30/2023)   Hunger Vital Sign   . Worried About Programme researcher, broadcasting/film/video in the Last Year: Never true   . Ran Out of Food in the Last Year: Never true  Transportation Needs: No Transportation Needs (08/30/2023)   PRAPARE - Transportation   . Lack of Transportation (Medical): No   . Lack of Transportation (Non-Medical): No  Physical Activity: Sufficiently Active (08/30/2023)   Exercise Vital Sign   . Days of Exercise per Week: 4 days   . Minutes of Exercise per Session: 50 min  Stress: No Stress Concern Present (08/30/2023)   [employer] of Occupational Health - Occupational Stress Questionnaire   . Feeling of Stress : Only a little  Social Connections: Socially Integrated (08/30/2023)   Social Connection and Isolation Panel   . Frequency of Communication with Friends and Family: Three times a week   . Frequency of Social Gatherings with Friends and Family: Once a week   . Attends Religious Services: 1 to 4 times per year   . Active Member of Clubs or Organizations:  Yes   . Attends Banker Meetings: More than 4 times per year   . Marital Status: Married   Family History  Problem Relation Age of Onset  . Anxiety disorder Mother   . Cancer Mother        melanoma  . Miscarriages / [place] Mother   . Asthma Brother   . Arthritis Maternal Grandmother   . Cancer Maternal Grandfather   . Heart attack Maternal Grandfather    No Known Allergies Current Outpatient Medications  Medication Sig Dispense Refill  . ascorbic acid (VITAMIN C) 500 MG tablet Take 500 mg by mouth daily.    . aspirin  EC 325 MG tablet Take 1 tablet (325 mg total) by mouth daily. 14 tablet 0  . Cholecalciferol (VITAMIN D3) 125 MCG (5000 UT) TABS Take 5,000 Units by mouth daily.    . cyanocobalamin (VITAMIN B12) 1000 MCG tablet Take 1,000 mcg by mouth daily.    . fexofenadine (ALLEGRA) 180 MG tablet Take 180 mg by mouth daily as needed for allergies.    . oxyCODONE  (ROXICODONE ) 5 MG immediate release tablet Take 1 tablet (5 mg total) by mouth every 4 (four) hours as needed for severe pain (pain score 7-10) or breakthrough pain. 10 tablet 0   No current facility-administered medications for this visit.   No results found.  Review of Systems:   A ROS was performed including pertinent positives and negatives as documented in the HPI.   Musculoskeletal Exam:    There were no vitals taken for this visit.  Left shoulder with well-appearing incision.  In the standing position he can forward elevate to approximately 50 degrees with external rotation at the side to 30 degrees.  Internal rotation deferred today.  Distal neurosensory exam is intact  Imaging:      I personally reviewed and interpreted the radiographs.   Assessment:   6 weeks status post left shoulder subscapularis repair open overall doing very well.  Will continue active range of motion as tolerated.  He will continue to work through the subscapularis repair protocol.  He is still having some AC  symptoms with clicking into this fact I did recommend an ultrasound-guided of the [facility] joint which she would like to proceed with.  I will plan to see him back in 6 weeks for reassessment  Plan :    - Return to clinic 6 weeks for reassessment    Procedure Note  Patient: Kent Johnson             Date of Birth: 02-10-1986           MRN: [id number]             Visit Date: 03/23/2024  Procedures: Visit Diagnoses:  1. Avulsion of left subscapularis, initial encounter  Large Joint Inj: L subacromial bursa on 03/23/2024 9:18 AM Indications: pain Details: 22 G 1.5 in needle, ultrasound-guided anterior approach  Arthrogram: No  Medications: 4 mL lidocaine  1 %; 80 mg triamcinolone  acetonide 40 MG/ML Outcome: tolerated well, no immediate complications Procedure, treatment alternatives, risks and benefits explained, specific risks discussed. Consent was given by the patient. Immediately prior to procedure a time out was called to verify the correct patient, procedure, equipment, support staff and site/side marked as required. Patient was prepped and draped in the usual sterile fashion.            I personally saw and evaluated the patient, and participated in the management and treatment plan.  Elspeth Parker, MD Attending Physician, Orthopedic Surgery  This document was dictated using Dragon voice recognition software. A reasonable attempt at proof reading has been made to minimize errors.

## 2024-03-23 NOTE — Therapy (Signed)
 OUTPATIENT PHYSICAL THERAPY TREATMENT      Patient Name: Kent Johnson MRN: [id number] DOB:03/16/1986, 38 y.o., male Today's Date: 03/23/2024  END OF SESSION:  PT End of Session - 03/23/24 1020     Visit Number 8    Number of Visits 24    Date for PT Re-Evaluation 05/06/24    Authorization Type BCBS Auth needed    Authorization - Number of Visits 24    PT Start Time 1016    PT Stop Time 1058    PT Time Calculation (min) 42 min    Activity Tolerance Patient tolerated treatment well;No increased pain    Behavior During Therapy [facility] for tasks assessed/performed                 Past Medical History:  Diagnosis Date   [place]   [place]    Arthritis 2021   rt knee   Routine general medical examination at a health care facility 04/08/2023   Past Surgical History:  Procedure Laterality Date   BICEPT TENODESIS Left 02/09/2024   Procedure: RIGHT PECTORAL BICEPS TENODESIS / POSSIBLE DERMAL AUGMENTATION;  Surgeon: Genelle Standing, MD;  Location: [facility];  Service: Orthopedics;  Laterality: Left;   CLAVICLE SURGERY  2011   broke collar bone skiing   COLON SURGERY  around 2007   benign lipoma inside of colon lead to intusseption   LIPOMA EXCISION  2007   benign lipoma in colon, caused intersepcion   OPEN SUBSCAPULARIS REPAIR Left 02/09/2024   Procedure: LEFT SHOULDER OPEN SUBSCAPULARIS REPAIR;  Surgeon: Genelle Standing, MD;  Location: [facility];  Service: Orthopedics;  Laterality: Left;   SHOULDER ARTHROSCOPY Left 02/09/2024   Procedure: ARTHROSCOPY, SHOULDER;  Surgeon: Genelle Standing, MD;  Location: [facility];  Service: Orthopedics;  Laterality: Left;   SHOULDER ARTHROSCOPY WITH DEBRIDEMENT AND BICEP TENDON REPAIR Left 03/11/2022   Procedure: LEFT SHOULDER ARTHROSCOPY WITH MINI OPEN SUBSCAPULARIS REPAIR  AND BICEP TENDODESIS;  Surgeon: Genelle Standing, MD;  Location: [facility];   Service: Orthopedics;  Laterality: Left;   WOUND EXPLORATION N/A 05/23/2022   Procedure: WOUND EXPLORATION WITH REMOVAL OF FOREIGN BODY OF ABDOMINAL WALL;  Surgeon: Eletha Boas, MD;  Location: [facility];  Service: General;  Laterality: N/A;   Patient Active Problem List   Diagnosis Date Noted   Routine general medical examination at a health care facility 04/08/2023   Foreign body in anterior abdominal wall 05/18/2022   Avulsion of left subscapularis    Anxiety disorder 11/11/2021   Insomnia 11/11/2021    PCP: [facility]  REFERRING PROVIDER: Standing Genelle, MD  REFERRING DIAG: S46.892A (ICD-10-CM) - Avulsion of left subscapularis, initial encounter  THERAPY DIAG:  Muscle weakness (generalized)  Stiffness of left shoulder, not elsewhere classified  Acute pain of left shoulder  Localized edema  Rationale for Evaluation and Treatment: Rehabilitation  ONSET DATE: 02/09/2024  SUBJECTIVE:  SUBJECTIVE STATEMENT: Saw MD this morning - had injection at [facility] joint; otherwise no issues   Hand dominance: Left  PERTINENT HISTORY:  Left biceps tenodesis, left subscapularis repair 2023, left clavicle fracture 2011  PAIN:  NPRS scale: 1-2/10 this week Pain location: Lt pectoralis and anterior shoulder Pain description: Slight ache/cramp  Aggravating factors: Overuse Relieving factors: Rest and exercise  PRECAUTIONS: Shoulder-revision.  This is [name]'s second subscapularis repair on the left side so we will need to be conservative with starting strengthening given his revision status.  Recommend holding off until the end of week 10 post-surgery before beginning any resisted strength work.  RED FLAGS: None   WEIGHT BEARING RESTRICTIONS: Yes No left upper extremity weight-bearing  FALLS:   Has patient fallen in last 6 months? No  LIVING ENVIRONMENT: Lives with: lives with their family, lives with their spouse, and with 3 children Lives in: House/apartment Stairs: No problems Has following equipment at home: Lt shoulder brace  OCCUPATION: Sport and exercise psychologist, power infrastructure  PLOF: Independent  PATIENT GOALS:Return to swimming, yoga and ADLs without restrictions  NEXT MD VISIT: March 23, 2024  OBJECTIVE:   Note: Objective measures were completed at Evaluation unless otherwise noted.  DIAGNOSTIC FINDINGS:  1. Prior rotator cuff repair. Moderate supraspinatus tendinosis. 2. High-grade partial, near complete, tear of the subscapularis tendon. 3. No labral tear.  PATIENT SURVEYS:  PSFS: THE PATIENT SPECIFIC FUNCTIONAL SCALE  Place score of 0-10 (0 = unable to perform activity and 10 = able to perform activity at the same level as before injury or problem)  Activity Date: 02/12/2024    Swimming 1    2.  Lifting the arm straight out 1    3.  Shoulder press at the gym 0    4.  Yoga 3    Total Score 1.25      Total Score = Sum of activity scores/number of activities  Minimally Detectable Change: 3 points (for single activity); 2 points (for average score)   COGNITION: 02/12/2024 Overall cognitive status: Within functional limits for tasks assessed     SENSATION: 02/12/2024 [facility]  POSTURE: 02/12/2024 No significant postural issues noted  UPPER EXTREMITY ROM:   ROM Left/Right 02/12/2024 PROM Left 03/04/2024 PROM Left 03/11/2024 In supine Left 03/18/2024  Shoulder flexion 105/175 130 140 AAROM 150  Shoulder extension      Shoulder abduction   112 AAROM in supine   Shoulder horizontal adduction 0/35   30  Shoulder internal rotation 50/70 40  40  Shoulder external rotation 30/105 45 52 AAROM in supine 45 deg abduction.  60  (Blank rows = not tested)  UPPER EXTREMITY STRENGTH: Deferred due to being 72 hours post-surgery      Shoulder flexion     Shoulder extension    Shoulder abduction    Shoulder adduction    Shoulder internal rotation    Shoulder external rotation    Middle trapezius    Lower trapezius    Elbow flexion    Elbow extension    Wrist flexion    Wrist extension    Wrist ulnar deviation    Wrist radial deviation    Wrist pronation    Wrist supination    Grip strength (lbs)    (Blank rows = not tested)  TREATMENT 03/23/24 TherAct UBE L3 x 6 min (3 min each direction) Wall ladder flexion and scaption x 10 reps each on Lt AA flexion 2# bar 2x10; standing AA abduction 2# bar 2x10; standing Supine protraction 2x10 2# on Lt Sidelying ER 2x10; 2# weight; 1# weight Rows L4 band 2x10    03/18/2024 Supine IR stretch supine, elbow 70 degrees to side, reach towards pants pocket 20 x 10 seconds, should be no pain with this activity Biceps curls with supination with 1# 20 x 3 seconds Side-lie ER (anti-gravity) 10 x 3 seconds  Avoid aggressive shoulder extension and ER to minimize strain on the subscapularis revision.  Functional Activities: Scapular retraction/shoulder blade pinches 20 x 5 seconds (Postural correction and scapular strengthening) Serratus anterior punch/Supine arm raise 20 x 3 seconds for reaching and scapular protraction Supine shoulder flexion AROM 10 x 10 seconds (protract 1st with left palm facing in)   03/16/24 TherAct Supine shoulder flexion 2x10 with 3 sec hold  Supine protraction 2x10; 3 sec hold; 2# weight Sidelying ER on Lt 2x10 Sidelying abduction on Lt 2x10 Wall ladder flexion/scaption 2x10 Rows L3 band 2x10; 3 sec hold Standing shoulder isometrics 20x5 sec hold: flexion, IR   03/11/2024 Therex: Sidelying Lt shoulder ER with towel under arm AROM 2 x 15  Supine Lt shoulder protraction in 90 deg fleixon 3 sec hold 2 x 15  Supine Lt shoulder ER/IR AROM 5 sec hold to  tolerance with towel under arm in approx. 70 deg abduction  Supine isometric 5 sec hold x 10 with arm at side painfree for Lt shoulder flexion, abduction.    TherActivity(to improve functional reach) Supine AAROM 1 lb bar flexion to tolerance x 15 Supine AROM Lt shoulder flexion 2 x 15    Manual: Supine Lt shoulder flexion, scaption, abduction c distraction mobilization with movement.  G2 inferior glides.  Mobilization c movement passive ER to tolerance Lt shoulder with posterior sustained glide.    PATIENT EDUCATION: Education details: See above Person educated: Patient Education method: Explanation, Demonstration, Tactile cues, Verbal cues, and Handouts Education comprehension: verbalized understanding, returned demonstration, verbal cues required, tactile cues required, and needs further education  HOME EXERCISE PROGRAM: Access Code: [email or web address] URL: [email or web address] Date: 03/18/2024 Prepared by: Lamar Ivory  Exercises - Standing Scapular Retraction  - 5 x daily - 7 x weekly - 1 sets - 5 reps - 5 second hold - Supine Scapular Protraction in Flexion with Dumbbells  - 2 x daily - 7 x weekly - 1 sets - 20 reps - 3 seconds hold - Supine Shoulder Flexion PROM  - 1 x daily - 7 x weekly - 1 sets - 10 reps - 10 seconds hold - Supine Shoulder Internal Rotation Stretch  - 2 x daily - 7 x weekly - 1 sets - 10-20 reps - 10 seconds hold - Standing Bicep Curls with Dumbbells and Rotation  - 1-2 x daily - 7 x weekly - 1 sets - 20 reps - 3 seconds hold - Sidelying Shoulder External Rotation Dumbbell  - 1 x daily - 7 x weekly - 1-2 sets - 10 reps - 3 second hold  ASSESSMENT:  CLINICAL IMPRESSION: Pt tolerated session well today with focus on maximizing ROM as well as light strengthening and muscle activation.  Overall progressing well with PT, still with weakness and limited ROM.  Continue skilled PT.    OBJECTIVE IMPAIRMENTS: decreased activity tolerance, decreased  endurance, decreased knowledge of condition, decreased ROM, decreased strength, decreased safety  awareness, increased edema, impaired perceived functional ability, impaired UE functional use, and pain.   ACTIVITY LIMITATIONS: carrying, lifting, dressing, and reach over head  PARTICIPATION LIMITATIONS: cleaning, driving, community activity, occupation, and yard work  PERSONAL FACTORS: Left biceps tenodesis, left subscapularis repair 2023, left clavicle fracture 2011 are also affecting patient's functional outcome.   REHAB POTENTIAL: Good  CLINICAL DECISION MAKING: Evolving/moderate complexity  EVALUATION COMPLEXITY: Moderate   GOALS: Goals reviewed with patient? Yes  SHORT TERM GOALS: Target date: 03/25/2024  [name] will be independent with his day 1 home exercise program Baseline: Started 02/12/2024 Goal status: Met 03/04/2024  2.  Improve left shoulder passive range of motion for flexion to 135 degrees; internal rotation to 60 degrees; external rotation to 30 degrees and horizontal adduction to 30 degrees Baseline: 105; 50; 30 and 0 respectively Goal status: Mostly met 03/18/2024  LONG TERM GOALS: Target date: 05/06/2024  Improve patient specific functional score to at least 6 Baseline: 1.25 Goal status: INITIAL  2.  [name] will be able to maintain left shoulder pain at a level of 2/10 or better on the visual analog scale Baseline: 2/10 72 hours post-surgery Goal status: INITIAL  3.  Improve left shoulder active range of motion to 90% or better of the uninvolved right Baseline: See objective Goal status: INITIAL  4.  Improve left shoulder strength for internal/external rotation to 60% of the uninvolved right at 12 weeks postsurgery (9 months to a year expected for 90% plus strength after the revision) Baseline: Deferred secondary to surgery 72 hours ago Goal status: INITIAL  5.  [name] will be independent with his long-term maintenance home exercise program at  discharge Baseline: Started 02/12/2024 Goal status: INITIAL   PLAN:  PT FREQUENCY: 2x/week  PT DURATION: 12 weeks  PLANNED INTERVENTIONS: 97750- Physical Performance Testing, 97110-Therapeutic exercises, 97530- Therapeutic activity, 97112- Neuromuscular re-education, 97535- Self Care, [id number]- Manual therapy, [id number]- Electrical stimulation (unattended), 97016- Vasopneumatic device, Patient/Family education, Joint mobilization, and Cryotherapy  PLAN FOR NEXT SESSION: light sstrength while avoiding unnecessary strain on the subscapularis.  Okay for progression of AAROM/AROM while avoiding excessive external rotation or extension.  AVOID IR against resistance and hand behind back activities for a bit longer.  Check on [employer]BS authorization information from eval.  03/16/24: Front office aware, submitting for auth     02/26/24- per MD note from 02/24/24 (updated surgical precautions)  2 weeks status post left shoulder subscapularis repair open overall doing very well. At today's visit I did describe that I would recommend allowed active forward elevation in the scapular plane as well as external rotation. I did describe that mildly restriction would be no internal rotation against weight or no going behind the back. Other than that he may begin active forward elevation as well as external rotation   Corean JULIANNA Ku, PT, DPT 03/23/24 1:14 PM

## 2024-03-25 ENCOUNTER — Ambulatory Visit (INDEPENDENT_AMBULATORY_CARE_PROVIDER_SITE_OTHER): Admitting: Rehabilitative and Restorative Service Providers"

## 2024-03-25 ENCOUNTER — Encounter: Payer: Self-pay | Admitting: Rehabilitative and Restorative Service Providers"

## 2024-03-25 DIAGNOSIS — M25612 Stiffness of left shoulder, not elsewhere classified: Secondary | ICD-10-CM

## 2024-03-25 DIAGNOSIS — M6281 Muscle weakness (generalized): Secondary | ICD-10-CM

## 2024-03-25 DIAGNOSIS — R600 Localized edema: Secondary | ICD-10-CM

## 2024-03-25 DIAGNOSIS — M25512 Pain in left shoulder: Secondary | ICD-10-CM | POA: Diagnosis not present

## 2024-03-25 NOTE — Therapy (Signed)
 OUTPATIENT PHYSICAL THERAPY TREATMENT      Patient Name: Kent Johnson MRN: [id number] DOB:12-02-1985, 38 y.o., male Today's Date: 03/25/2024  END OF SESSION:  PT End of Session - 03/25/24 1541     Visit Number 9    Number of Visits 24    Date for PT Re-Evaluation 05/06/24    Authorization Type BCBS Auth needed    Authorization - Number of Visits 24    PT Start Time 0847    PT Stop Time 0929    PT Time Calculation (min) 42 min    Activity Tolerance Patient tolerated treatment well;No increased pain    Behavior During Therapy [facility] for tasks assessed/performed                  Past Medical History:  Diagnosis Date   [place]   [place]    Arthritis 2021   rt knee   Routine general medical examination at a health care facility 04/08/2023   Past Surgical History:  Procedure Laterality Date   BICEPT TENODESIS Left 02/09/2024   Procedure: RIGHT PECTORAL BICEPS TENODESIS / POSSIBLE DERMAL AUGMENTATION;  Surgeon: Kent Standing, MD;  Location: [facility];  Service: Orthopedics;  Laterality: Left;   CLAVICLE SURGERY  2011   broke collar bone skiing   COLON SURGERY  around 2007   benign lipoma inside of colon lead to intusseption   LIPOMA EXCISION  2007   benign lipoma in colon, caused intersepcion   OPEN SUBSCAPULARIS REPAIR Left 02/09/2024   Procedure: LEFT SHOULDER OPEN SUBSCAPULARIS REPAIR;  Surgeon: Kent Standing, MD;  Location: [facility];  Service: Orthopedics;  Laterality: Left;   SHOULDER ARTHROSCOPY Left 02/09/2024   Procedure: ARTHROSCOPY, SHOULDER;  Surgeon: Kent Standing, MD;  Location: [facility];  Service: Orthopedics;  Laterality: Left;   SHOULDER ARTHROSCOPY WITH DEBRIDEMENT AND BICEP TENDON REPAIR Left 03/11/2022   Procedure: LEFT SHOULDER ARTHROSCOPY WITH MINI OPEN SUBSCAPULARIS REPAIR  AND BICEP TENDODESIS;  Surgeon: Kent Standing, MD;  Location[facility];   Service: Orthopedics;  Laterality: Left;   WOUND EXPLORATION N/A 05/23/2022   Procedure: WOUND EXPLORATION WITH REMOVAL OF FOREIGN BODY OF ABDOMINAL WALL;  Surgeon: Kent Boas, MD;  [facility]: [facility];  Service: General;  Laterality: N/A;   Patient Active Problem List   Diagnosis Date Noted   Routine general medical examination at a health care facility 04/08/2023   Foreign body in anterior abdominal wall 05/18/2022   Avulsion of left subscapularis    Anxiety disorder 11/11/2021   Insomnia 11/11/2021    PCP: [facility]  REFERRING PROVIDER: Standing Genelle, MD  REFERRING DIAG: S46.892A (ICD-10-CM) - Avulsion of left subscapularis, initial encounter  THERAPY DIAG:  Muscle weakness (generalized)  Stiffness of left shoulder, not elsewhere classified  Acute pain of left shoulder  Localized edema  Rationale for Evaluation and Treatment: Rehabilitation  ONSET DATE: 02/09/2024  SUBJECTIVE:  SUBJECTIVE STATEMENT: [name] reports some soreness this morning, possibly some lingering soreness from his injection or from the rainy weather the past day.  Hand dominance: Left  PERTINENT HISTORY:  Left biceps tenodesis, left subscapularis repair 2023, left clavicle fracture 2011  PAIN:  NPRS scale: 1-2/10 this week Pain location: Lt pectoralis and anterior shoulder Pain description: Slight ache/cramp  Aggravating factors: Overuse Relieving factors: Rest and exercise  PRECAUTIONS: Shoulder-revision.  This is [name]'s second subscapularis repair on the left side so we will need to be conservative with starting strengthening given his revision status.  Recommend holding off until the end of week 10 post-surgery before beginning any resisted strength work.  RED FLAGS: None   WEIGHT BEARING  RESTRICTIONS: Yes No left upper extremity weight-bearing  FALLS:  Has patient fallen in last 6 months? No  LIVING ENVIRONMENT: Lives with: lives with their family, lives with their spouse, and with 3 children Lives in: House/apartment Stairs: No problems Has following equipment at home: Lt shoulder brace  OCCUPATION: Sport and exercise psychologist, power infrastructure  PLOF: Independent  PATIENT GOALS:Return to swimming, yoga and ADLs without restrictions  NEXT MD VISIT: May 04, 2024  OBJECTIVE:   Note: Objective measures were completed at Evaluation unless otherwise noted.  DIAGNOSTIC FINDINGS:  1. Prior rotator cuff repair. Moderate supraspinatus tendinosis. 2. High-grade partial, near complete, tear of the subscapularis tendon. 3. No labral tear.  PATIENT SURVEYS:  PSFS: THE PATIENT SPECIFIC FUNCTIONAL SCALE  Place score of 0-10 (0 = unable to perform activity and 10 = able to perform activity at the same level as before injury or problem)  Activity Date: 02/12/2024 03/25/2024   Swimming 1 5   2.  Lifting the arm straight out 1 2   3.  Shoulder press at the gym 0 0   4.  Yoga 3 3   Total Score 1.25 2.5     Total Score = Sum of activity scores/number of activities  Minimally Detectable Change: 3 points (for single activity); 2 points (for average score)   COGNITION: 02/12/2024 Overall cognitive status: Within functional limits for tasks assessed     SENSATION: 02/12/2024 [facility]  POSTURE: 02/12/2024 No significant postural issues noted  UPPER EXTREMITY ROM:   ROM Left/Right 02/12/2024 PROM Left 03/04/2024 PROM Left 03/11/2024 In supine Left 03/18/2024 Left 03/25/2024  Shoulder flexion 105/175 130 140 AAROM 150 150  Shoulder extension       Shoulder abduction   112 AAROM in supine    Shoulder horizontal adduction 0/35   30 35  Shoulder internal rotation 50/70 40  40 60  Shoulder external rotation 30/105 45 52 AAROM in supine 45 deg abduction.  60 60  (Blank rows = not  tested)  UPPER EXTREMITY STRENGTH: Deferred due to being 72 hours post-surgery      Shoulder flexion    Shoulder extension    Shoulder abduction    Shoulder adduction    Shoulder internal rotation    Shoulder external rotation    Middle trapezius    Lower trapezius    Elbow flexion    Elbow extension    Wrist flexion    Wrist extension    Wrist ulnar deviation    Wrist radial deviation    Wrist pronation    Wrist supination    Grip strength (lbs)    (Blank rows = not tested)  TREATMENT 03/25/2024 Supine IR stretch supine, elbow 70 degrees to side, reach towards pants pocket 20 x 10 seconds, should be no pain with this activity Biceps curls with supination with 3# 20 x 3 seconds Side-lie ER (anti-gravity) 2 sets of 10 x 3 seconds with 1#  Avoid aggressive shoulder extension and ER to minimize strain on the subscapularis revision.  OK to do a bit more with extension and external rotation now that we are beyond week 6 post-revision.  Functional Activities: Scapular retraction/shoulder blade pinches 10 x 5 seconds (Postural correction and scapular strengthening) Serratus anterior punch/Supine arm raise 2 sets of 20 x 3 seconds for reaching and scapular protraction with 2# Supine shoulder flexion AROM 10 x 10 seconds (protract 1st with left palm facing in)   03/23/24 TherAct UBE L3 x 6 min (3 min each direction) Wall ladder flexion and scaption x 10 reps each on Lt AA flexion 2# bar 2x10; Johnson AA abduction 2# bar 2x10; Johnson Supine protraction 2x10 2# on Lt Sidelying ER 2x10; 2# weight; 1# weight Rows L4 band 2x10   03/18/2024 Supine IR stretch supine, elbow 70 degrees to side, reach towards pants pocket 20 x 10 seconds, should be no pain with this activity Biceps curls with supination with 1# 20 x 3 seconds Side-lie ER (anti-gravity) 10 x 3  seconds  Avoid aggressive shoulder extension and ER to minimize strain on the subscapularis revision.  Functional Activities: Scapular retraction/shoulder blade pinches 20 x 5 seconds (Postural correction and scapular strengthening) Serratus anterior punch/Supine arm raise 20 x 3 seconds for reaching and scapular protraction Supine shoulder flexion AROM 10 x 10 seconds (protract 1st with left palm facing in)   PATIENT EDUCATION: Education details: See above Person educated: Patient Education method: Explanation, Demonstration, Tactile cues, Verbal cues, and Handouts Education comprehension: verbalized understanding, returned demonstration, verbal cues required, tactile cues required, and needs further education  HOME EXERCISE PROGRAM: Access Code: [email or web address] URL: [email or web address] Date: 03/18/2024 Prepared by: Lamar Ivory  Exercises - Johnson Scapular Retraction  - 5 x daily - 7 x weekly - 1 sets - 5 reps - 5 second hold - Supine Scapular Protraction in Flexion with Dumbbells  - 2 x daily - 7 x weekly - 1 sets - 20 reps - 3 seconds hold - Supine Shoulder Flexion PROM  - 1 x daily - 7 x weekly - 1 sets - 10 reps - 10 seconds hold - Supine Shoulder Internal Rotation Stretch  - 2 x daily - 7 x weekly - 1 sets - 10-20 reps - 10 seconds hold - Johnson Bicep Curls with Dumbbells and Rotation  - 1-2 x daily - 7 x weekly - 1 sets - 20 reps - 3 seconds hold - Sidelying Shoulder External Rotation Dumbbell  - 1 x daily - 7 x weekly - 1-2 sets - 10 reps - 3 second hold  ASSESSMENT:  CLINICAL IMPRESSION: [name] has excellent active range of motion for just past 6 weeks post subscapularis revision.  Because this is a revision surgery, we are being very conservative with strength progressions, particularly resisted internal rotation or stretching into external rotation or extension.  We also discussed possibly dropping to 1 time per week over the next 2 to 4 weeks as he is in  kind of a wait and the patient phase of his rehab as his active range of motion is excellent and it is a bit too early to increase his strengthening very aggressively.  [name] remains on track  to meet long-term goals established at evaluation.   OBJECTIVE IMPAIRMENTS: decreased activity tolerance, decreased endurance, decreased knowledge of condition, decreased ROM, decreased strength, decreased safety awareness, increased edema, impaired perceived functional ability, impaired UE functional use, and pain.   ACTIVITY LIMITATIONS: carrying, lifting, dressing, and reach over head  PARTICIPATION LIMITATIONS: cleaning, driving, community activity, occupation, and yard work  PERSONAL FACTORS: Left biceps tenodesis, left subscapularis repair 2023, left clavicle fracture 2011 are also affecting patient's functional outcome.   REHAB POTENTIAL: Good  CLINICAL DECISION MAKING: Evolving/moderate complexity  EVALUATION COMPLEXITY: Moderate   GOALS: Goals reviewed with patient? Yes  SHORT TERM GOALS: Target date: 03/25/2024  [name] will be independent with his day 1 home exercise program Baseline: Started 02/12/2024 Goal status: Met 03/04/2024  2.  Improve left shoulder passive range of motion for flexion to 135 degrees; internal rotation to 60 degrees; external rotation to 30 degrees and horizontal adduction to 30 degrees Baseline: 105; 50; 30 and 0 respectively Goal status: Met 03/25/2024  LONG TERM GOALS: Target date: 05/06/2024  Improve patient specific functional score to at least 6 Baseline: 1.25 Goal status: INITIAL  2.  [name] will be able to maintain left shoulder pain at a level of 2/10 or better on the visual analog scale Baseline: 2/10 72 hours post-surgery Goal status: INITIAL  3.  Improve left shoulder active range of motion to 90% or better of the uninvolved right Baseline: See objective Goal status: INITIAL  4.  Improve left shoulder strength for internal/external rotation to  60% of the uninvolved right at 12 weeks postsurgery (9 months to a year expected for 90% plus strength after the revision) Baseline: Deferred secondary to surgery 72 hours ago Goal status: INITIAL  5.  [name] will be independent with his long-term maintenance home exercise program at discharge Baseline: Started 02/12/2024 Goal status: INITIAL   PLAN:  PT FREQUENCY: 2x/week  PT DURATION: 12 weeks  PLANNED INTERVENTIONS: 97750- Physical Performance Testing, 97110-Therapeutic exercises, 97530- Therapeutic activity, 97112- Neuromuscular re-education, 97535- Self Care, [id number]- Manual therapy, [id number]- Electrical stimulation (unattended), [id number]- Vasopneumatic device, Patient/Family education, Joint mobilization, and Cryotherapy  PLAN FOR NEXT SESSION: Progression of AAROM/AROM including conservative external rotation and extension.  Okay to progress resistance with activities with the exception of internal rotation and aggressive external rotation or extension stretching.  AVOID IR against resistance.  Check on BCBS authorization information from eval.  03/16/24: Front office aware, submitting for auth     02/26/24- per MD note from 02/24/24 (updated surgical precautions)  2 weeks status post left shoulder subscapularis repair open overall doing very well. At today's visit I did describe that I would recommend allowed active forward elevation in the scapular plane as well as external rotation. I did describe that mildly restriction would be no internal rotation against weight or no going behind the back. Other than that he may begin active forward elevation as well as external rotation   Myer LELON Ivory PT, MPT 03/25/24 3:48 PM

## 2024-03-28 ENCOUNTER — Encounter (HOSPITAL_BASED_OUTPATIENT_CLINIC_OR_DEPARTMENT_OTHER): Payer: Self-pay | Admitting: Orthopaedic Surgery

## 2024-03-29 ENCOUNTER — Ambulatory Visit (INDEPENDENT_AMBULATORY_CARE_PROVIDER_SITE_OTHER): Admitting: Physical Therapy

## 2024-03-29 ENCOUNTER — Encounter: Admitting: Rehabilitative and Restorative Service Providers"

## 2024-03-29 ENCOUNTER — Encounter: Payer: Self-pay | Admitting: Physical Therapy

## 2024-03-29 DIAGNOSIS — R600 Localized edema: Secondary | ICD-10-CM

## 2024-03-29 DIAGNOSIS — M25512 Pain in left shoulder: Secondary | ICD-10-CM

## 2024-03-29 DIAGNOSIS — M25612 Stiffness of left shoulder, not elsewhere classified: Secondary | ICD-10-CM | POA: Diagnosis not present

## 2024-03-29 DIAGNOSIS — M6281 Muscle weakness (generalized): Secondary | ICD-10-CM | POA: Diagnosis not present

## 2024-03-29 NOTE — Therapy (Signed)
 OUTPATIENT PHYSICAL THERAPY TREATMENT      Patient Name: Kent Johnson MRN: [id number] DOB:[birth year], 38 y.o., male Today's Date: 03/29/2024  END OF SESSION:  PT End of Session - 03/29/24 0844     Visit Number 10    Number of Visits 24    Date for PT Re-Evaluation 05/06/24    Authorization Type [employer] (no auth needed)    Authorization - Visit Number 10    Authorization - Number of Visits 24    PT Start Time 0845    PT Stop Time 0925    PT Time Calculation (min) 40 min    Activity Tolerance Patient tolerated treatment well;No increased pain    Behavior During Therapy [facility] for tasks assessed/performed                   Past Medical History:  Diagnosis Date   [place]   [place]    Arthritis 2021   rt knee   Routine general medical examination at a health care facility 04/08/2023   Past Surgical History:  Procedure Laterality Date   BICEPT TENODESIS Left 02/09/2024   Procedure: RIGHT PECTORAL BICEPS TENODESIS / POSSIBLE DERMAL AUGMENTATION;  Surgeon: Genelle Standing, MD;  Location: [facility];  Service: Orthopedics;  Laterality: Left;   CLAVICLE SURGERY  2011   broke collar bone skiing   COLON SURGERY  around 2007   benign lipoma inside of colon lead to intusseption   LIPOMA EXCISION  2007   benign lipoma in colon, caused intersepcion   OPEN SUBSCAPULARIS REPAIR Left 02/09/2024   Procedure: LEFT SHOULDER OPEN SUBSCAPULARIS REPAIR;  Surgeon: Genelle Standing, MD;  Location: [facility];  Service: Orthopedics;  Laterality: Left;   SHOULDER ARTHROSCOPY Left 02/09/2024   Procedure: ARTHROSCOPY, SHOULDER;  Surgeon: Genelle Standing, MD;  Location: [facility];  Service: Orthopedics;  Laterality: Left;   SHOULDER ARTHROSCOPY WITH DEBRIDEMENT AND BICEP TENDON REPAIR Left 03/11/2022   Procedure: LEFT SHOULDER ARTHROSCOPY WITH MINI OPEN SUBSCAPULARIS REPAIR  AND BICEP TENDODESIS;  Surgeon: Genelle Standing, MD;  Location: [facility];  Service: Orthopedics;  Laterality: Left;   WOUND EXPLORATION N/A 05/23/2022   Procedure: WOUND EXPLORATION WITH REMOVAL OF FOREIGN BODY OF ABDOMINAL WALL;  Surgeon: Eletha Boas, MD;  Location: [facility];  Service: General;  Laterality: N/A;   Patient Active Problem List   Diagnosis Date Noted   Routine general medical examination at a health care facility 04/08/2023   Foreign body in anterior abdominal wall 05/18/2022   Avulsion of left subscapularis    Anxiety disorder 11/11/2021   Insomnia 11/11/2021    PCP: [facility]  REFERRING PROVIDER: Standing Genelle, MD  REFERRING DIAG: S46.892A (ICD-10-CM) - Avulsion of left subscapularis, initial encounter  THERAPY DIAG:  Muscle weakness (generalized)  Stiffness of left shoulder, not elsewhere classified  Acute pain of left shoulder  Localized edema  Rationale for Evaluation and Treatment: Rehabilitation  ONSET DATE: 02/09/2024  SUBJECTIVE:  SUBJECTIVE STATEMENT: Still having some occasional soreness  Hand dominance: Left  PERTINENT HISTORY:  Left biceps tenodesis, left subscapularis repair 2023, left clavicle fracture 2011  PAIN:  NPRS scale: 1-2/10 this week Pain location: Lt pectoralis and anterior shoulder Pain description: Slight ache/cramp  Aggravating factors: Overuse Relieving factors: Rest and exercise  PRECAUTIONS: Shoulder-revision.  This is Kent Johnson's second subscapularis repair on the left side so we will need to be conservative with starting strengthening given his revision status.  Recommend holding off until the end of week 10 post-surgery before beginning any resisted strength work.  RED FLAGS: None   WEIGHT BEARING RESTRICTIONS: Yes No left upper extremity  weight-bearing  FALLS:  Has patient fallen in last 6 months? No  LIVING ENVIRONMENT: Lives with: lives with their family, lives with their spouse, and with 3 children Lives in: House/apartment Stairs: No problems Has following equipment at home: Lt shoulder brace  OCCUPATION: Sport and exercise psychologist, power infrastructure  PLOF: Independent  PATIENT GOALS:Return to swimming, yoga and ADLs without restrictions  NEXT MD VISIT: May 04, 2024  OBJECTIVE:   Note: Objective measures were completed at Evaluation unless otherwise noted.  DIAGNOSTIC FINDINGS:  1. Prior rotator cuff repair. Moderate supraspinatus tendinosis. 2. High-grade partial, near complete, tear of the subscapularis tendon. 3. No labral tear.  PATIENT SURVEYS:  PSFS: THE PATIENT SPECIFIC FUNCTIONAL SCALE  Place score of 0-10 (0 = unable to perform activity and 10 = able to perform activity at the same level as before injury or problem)  Activity Date: 02/12/2024 03/25/2024   Swimming 1 5   2.  Lifting the arm straight out 1 2   3.  Shoulder press at the gym 0 0   4.  Yoga 3 3   Total Score 1.25 2.5     Total Score = Sum of activity scores/number of activities  Minimally Detectable Change: 3 points (for single activity); 2 points (for average score)   COGNITION: 02/12/2024 Overall cognitive status: Within functional limits for tasks assessed     SENSATION: 02/12/2024 [facility]  POSTURE: 02/12/2024 No significant postural issues noted  UPPER EXTREMITY ROM:   ROM Left/Right 02/12/2024 PROM Left 03/04/2024 PROM Left 03/11/2024 In supine Left  03/18/2024 Left  03/25/2024  Shoulder flexion 105/175 130 140 AAROM 150 150  Shoulder extension       Shoulder abduction   112 AAROM in supine    Shoulder horizontal adduction 0/35   30 35  Shoulder internal rotation 50/70 40  40 60  Shoulder external rotation 30/105 45 52 AAROM in supine 45 deg abduction.  60 60  (Blank rows = not tested)  UPPER EXTREMITY STRENGTH:  Deferred due to being 72 hours post-surgery      Shoulder flexion    Shoulder extension    Shoulder abduction    Shoulder adduction    Shoulder internal rotation    Shoulder external rotation    Middle trapezius    Lower trapezius    Elbow flexion    Elbow extension    Wrist flexion    Wrist extension    Wrist ulnar deviation    Wrist radial deviation    Wrist pronation    Wrist supination    Grip strength (lbs)    (Blank rows = not tested)  TREATMENT 03/29/24 TherAct UBE L4 x 6 min (3 min each direction) With BFR (110 mmHG LOP, exercises at 55 mmHG, cuff size 2; 30 sec rest between sets) Standing AA flexion with 1# bar 2x20 Standing AA abduction with 1# bar 2x20 Wall ladder flexion and abduction x10 each Rows L3 band 2x20  IR/ER reactive isometrics L3 band 2x10 each   03/25/2024 Supine IR stretch supine, elbow 70 degrees to side, reach towards pants pocket 20 x 10 seconds, should be no pain with this activity Biceps curls with supination with 3# 20 x 3 seconds Side-lie ER (anti-gravity) 2 sets of 10 x 3 seconds with 1#  Avoid aggressive shoulder extension and ER to minimize strain on the subscapularis revision.  OK to do a bit more with extension and external rotation now that we are beyond week 6 post-revision.  Functional Activities: Scapular retraction/shoulder blade pinches 10 x 5 seconds (Postural correction and scapular strengthening) Serratus anterior punch/Supine arm raise 2 sets of 20 x 3 seconds for reaching and scapular protraction with 2# Supine shoulder flexion AROM 10 x 10 seconds (protract 1st with left palm facing in)   03/23/24 TherAct UBE L3 x 6 min (3 min each direction) Wall ladder flexion and scaption x 10 reps each on Lt AA flexion 2# bar 2x10; standing AA abduction 2# bar 2x10; standing Supine protraction 2x10 2# on Lt Sidelying ER  2x10; 2# weight; 1# weight Rows L4 band 2x10   03/18/2024 Supine IR stretch supine, elbow 70 degrees to side, reach towards pants pocket 20 x 10 seconds, should be no pain with this activity Biceps curls with supination with 1# 20 x 3 seconds Side-lie ER (anti-gravity) 10 x 3 seconds  Avoid aggressive shoulder extension and ER to minimize strain on the subscapularis revision.  Functional Activities: Scapular retraction/shoulder blade pinches 20 x 5 seconds (Postural correction and scapular strengthening) Serratus anterior punch/Supine arm raise 20 x 3 seconds for reaching and scapular protraction Supine shoulder flexion AROM 10 x 10 seconds (protract 1st with left palm facing in)   PATIENT EDUCATION: Education details: See above Person educated: Patient Education method: Explanation, Demonstration, Tactile cues, Verbal cues, and Handouts Education comprehension: verbalized understanding, returned demonstration, verbal cues required, tactile cues required, and needs further education  HOME EXERCISE PROGRAM: Access Code: [email or web address] URL: [email or web address] Date: 03/18/2024 Prepared by: Lamar Ivory  Exercises - Standing Scapular Retraction  - 5 x daily - 7 x weekly - 1 sets - 5 reps - 5 second hold - Supine Scapular Protraction in Flexion with Dumbbells  - 2 x daily - 7 x weekly - 1 sets - 20 reps - 3 seconds hold - Supine Shoulder Flexion PROM  - 1 x daily - 7 x weekly - 1 sets - 10 reps - 10 seconds hold - Supine Shoulder Internal Rotation Stretch  - 2 x daily - 7 x weekly - 1 sets - 10-20 reps - 10 seconds hold - Standing Bicep Curls with Dumbbells and Rotation  - 1-2 x daily - 7 x weekly - 1 sets - 20 reps - 3 seconds hold - Sidelying Shoulder External Rotation Dumbbell  - 1 x daily - 7 x weekly - 1-2 sets - 10 reps - 3 second hold  ASSESSMENT:  CLINICAL IMPRESSION: Initiated blood flow restriction today to work on progressing strengthening.  Tolerated well at  this time, and expect he's likely to be sore after today.  Continue skilled PT.   OBJECTIVE IMPAIRMENTS: decreased activity tolerance, decreased  endurance, decreased knowledge of condition, decreased ROM, decreased strength, decreased safety awareness, increased edema, impaired perceived functional ability, impaired UE functional use, and pain.   ACTIVITY LIMITATIONS: carrying, lifting, dressing, and reach over head  PARTICIPATION LIMITATIONS: cleaning, driving, community activity, occupation, and yard work  PERSONAL FACTORS: Left biceps tenodesis, left subscapularis repair 2023, left clavicle fracture 2011 are also affecting patient's functional outcome.   REHAB POTENTIAL: Good  CLINICAL DECISION MAKING: Evolving/moderate complexity  EVALUATION COMPLEXITY: Moderate   GOALS: Goals reviewed with patient? Yes  SHORT TERM GOALS: Target date: 03/25/2024  Kent Johnson will be independent with his day 1 home exercise program Baseline: Started 02/12/2024 Goal status: Met 03/04/2024  2.  Improve left shoulder passive range of motion for flexion to 135 degrees; internal rotation to 60 degrees; external rotation to 30 degrees and horizontal adduction to 30 degrees Baseline: 105; 50; 30 and 0 respectively Goal status: Met 03/25/2024  LONG TERM GOALS: Target date: 05/06/2024  Improve patient specific functional score to at least 6 Baseline: 1.25 Goal status: INITIAL  2.  Kent Johnson will be able to maintain left shoulder pain at a level of 2/10 or better on the visual analog scale Baseline: 2/10 72 hours post-surgery Goal status: INITIAL  3.  Improve left shoulder active range of motion to 90% or better of the uninvolved right Baseline: See objective Goal status: INITIAL  4.  Improve left shoulder strength for internal/external rotation to 60% of the uninvolved right at 12 weeks postsurgery (9 months to a year expected for 90% plus strength after the revision) Baseline: Deferred secondary to surgery  72 hours ago Goal status: INITIAL  5.  Kent Johnson will be independent with his long-term maintenance home exercise program at discharge Baseline: Started 02/12/2024 Goal status: INITIAL   PLAN:  PT FREQUENCY: 2x/week  PT DURATION: 12 weeks  PLANNED INTERVENTIONS: 97750- Physical Performance Testing, 97110-Therapeutic exercises, 97530- Therapeutic activity, 97112- Neuromuscular re-education, 97535- Self Care, [id number]- Manual therapy, [id number]- Electrical stimulation (unattended), [id number]- Vasopneumatic device, Patient/Family education, Joint mobilization, and Cryotherapy  PLAN FOR NEXT SESSION: how was blood flow restriction and continue,  Progression of AAROM/AROM including conservative external rotation and extension.  Okay to progress resistance with activities with the exception of internal rotation and aggressive external rotation or extension stretching.  AVOID IR against resistance.    02/26/24- per MD note from 02/24/24 (updated surgical precautions)  2 weeks status post left shoulder subscapularis repair open overall doing very well. At today's visit I did describe that I would recommend allowed active forward elevation in the scapular plane as well as external rotation. I did describe that mildly restriction would be no internal rotation against weight or no going behind the back. Other than that he may begin active forward elevation as well as external rotation     Corean JULIANNA Ku, PT, DPT 03/29/24 9:33 AM

## 2024-04-01 ENCOUNTER — Encounter: Admitting: Rehabilitative and Restorative Service Providers"

## 2024-04-05 ENCOUNTER — Encounter: Payer: Self-pay | Admitting: Physical Therapy

## 2024-04-05 ENCOUNTER — Encounter: Admitting: Physical Therapy

## 2024-04-05 ENCOUNTER — Ambulatory Visit (INDEPENDENT_AMBULATORY_CARE_PROVIDER_SITE_OTHER): Admitting: Physical Therapy

## 2024-04-05 DIAGNOSIS — M6281 Muscle weakness (generalized): Secondary | ICD-10-CM | POA: Diagnosis not present

## 2024-04-05 DIAGNOSIS — R600 Localized edema: Secondary | ICD-10-CM

## 2024-04-05 DIAGNOSIS — M25512 Pain in left shoulder: Secondary | ICD-10-CM

## 2024-04-05 DIAGNOSIS — M25612 Stiffness of left shoulder, not elsewhere classified: Secondary | ICD-10-CM | POA: Diagnosis not present

## 2024-04-05 NOTE — Therapy (Signed)
 OUTPATIENT PHYSICAL THERAPY TREATMENT      Patient Name: Kent Johnson MRN: [id number] DOB:November 10, 1985, 38 y.o., male Today's Date: 04/05/2024  END OF SESSION:  PT End of Session - 04/05/24 0946     Visit Number 11    Number of Visits 24    Date for PT Re-Evaluation 05/06/24    Authorization Type [employer] (no auth needed)    Authorization - Number of Visits 24    PT Start Time 0845    PT Stop Time 0925    PT Time Calculation (min) 40 min    Activity Tolerance Patient tolerated treatment well;No increased pain    Behavior During Therapy [facility] for tasks assessed/performed                    Past Medical History:  Diagnosis Date   [place]   [place]    Arthritis 2021   rt knee   Routine general medical examination at a health care facility 04/08/2023   Past Surgical History:  Procedure Laterality Date   BICEPT TENODESIS Left 02/09/2024   Procedure: RIGHT PECTORAL BICEPS TENODESIS / POSSIBLE DERMAL AUGMENTATION;  Surgeon: Genelle Standing, MD;  Location: [facility];  Service: Orthopedics;  Laterality: Left;   CLAVICLE SURGERY  2011   broke collar bone skiing   COLON SURGERY  around 2007   benign lipoma inside of colon lead to intusseption   LIPOMA EXCISION  2007   benign lipoma in colon, caused intersepcion   OPEN SUBSCAPULARIS REPAIR Left 02/09/2024   Procedure: LEFT SHOULDER OPEN SUBSCAPULARIS REPAIR;  Surgeon: Genelle Standing, MD;  Location: [facility];  Service: Orthopedics;  Laterality: Left;   SHOULDER ARTHROSCOPY Left 02/09/2024   Procedure: ARTHROSCOPY, SHOULDER;  Surgeon: Genelle Standing, MD;  Location: [facility];  Service: Orthopedics;  Laterality: Left;   SHOULDER ARTHROSCOPY WITH DEBRIDEMENT AND BICEP TENDON REPAIR Left 03/11/2022   Procedure: LEFT SHOULDER ARTHROSCOPY WITH MINI OPEN SUBSCAPULARIS REPAIR  AND BICEP TENDODESIS;  Surgeon: Genelle Standing, MD;  Location: [facility];  Service: Orthopedics;  Laterality: Left;   WOUND EXPLORATION N/A 05/23/2022   Procedure: WOUND EXPLORATION WITH REMOVAL OF FOREIGN BODY OF ABDOMINAL WALL;  Surgeon: Eletha Boas, MD;  Location: [facility];  Service: General;  Laterality: N/A;   Patient Active Problem List   Diagnosis Date Noted   Routine general medical examination at a health care facility 04/08/2023   Foreign body in anterior abdominal wall 05/18/2022   Avulsion of left subscapularis    Anxiety disorder 11/11/2021   Insomnia 11/11/2021    PCP: [facility]  REFERRING PROVIDER: Standing Genelle, MD  REFERRING DIAG: S46.892A (ICD-10-CM) - Avulsion of left subscapularis, initial encounter  THERAPY DIAG:  Muscle weakness (generalized)  Stiffness of left shoulder, not elsewhere classified  Acute pain of left shoulder  Localized edema  Rationale for Evaluation and Treatment: Rehabilitation  ONSET DATE: 02/09/2024  SUBJECTIVE:  SUBJECTIVE STATEMENT: Wasn't sore after last session.  No pain  Hand dominance: Left  PERTINENT HISTORY:  Left biceps tenodesis, left subscapularis repair 2023, left clavicle fracture 2011  PAIN:  NPRS scale: 1-2/10 this week Pain location: Lt pectoralis and anterior shoulder Pain description: Slight ache/cramp  Aggravating factors: Overuse Relieving factors: Rest and exercise  PRECAUTIONS: Shoulder-revision.  This is [name]'s second subscapularis repair on the left side so we will need to be conservative with starting strengthening given his revision status.  Recommend holding off until the end of week 10 post-surgery before beginning any resisted strength work.  RED FLAGS: None   WEIGHT BEARING RESTRICTIONS: Yes No left upper extremity weight-bearing  FALLS:  Has patient  fallen in last 6 months? No  LIVING ENVIRONMENT: Lives with: lives with their family, lives with their spouse, and with 3 children Lives in: House/apartment Stairs: No problems Has following equipment at home: Lt shoulder brace  OCCUPATION: Sport and exercise psychologist, power infrastructure  PLOF: Independent  PATIENT GOALS:Return to swimming, yoga and ADLs without restrictions  NEXT MD VISIT: May 04, 2024  OBJECTIVE:   Note: Objective measures were completed at Evaluation unless otherwise noted.  DIAGNOSTIC FINDINGS:  1. Prior rotator cuff repair. Moderate supraspinatus tendinosis. 2. High-grade partial, near complete, tear of the subscapularis tendon. 3. No labral tear.  PATIENT SURVEYS:  PSFS: THE PATIENT SPECIFIC FUNCTIONAL SCALE  Place score of 0-10 (0 = unable to perform activity and 10 = able to perform activity at the same level as before injury or problem)  Activity Date: 02/12/2024 03/25/2024   Swimming 1 5   2.  Lifting the arm straight out 1 2   3.  Shoulder press at the gym 0 0   4.  Yoga 3 3   Total Score 1.25 2.5     Total Score = Sum of activity scores/number of activities  Minimally Detectable Change: 3 points (for single activity); 2 points (for average score)   COGNITION: 02/12/2024 Overall cognitive status: Within functional limits for tasks assessed     SENSATION: 02/12/2024 [facility]  POSTURE: 02/12/2024 No significant postural issues noted  UPPER EXTREMITY ROM:   ROM Left/Right 02/12/2024 PROM Left 03/04/2024 PROM Left 03/11/2024 In supine Left  03/18/2024 Left  03/25/2024  Shoulder flexion 105/175 130 140 AAROM 150 150  Shoulder extension       Shoulder abduction   112 AAROM in supine    Shoulder horizontal adduction 0/35   30 35  Shoulder internal rotation 50/70 40  40 60  Shoulder external rotation 30/105 45 52 AAROM in supine 45 deg abduction.  60 60  (Blank rows = not tested)  UPPER EXTREMITY STRENGTH: Deferred due to being 72 hours  post-surgery      Shoulder flexion    Shoulder extension    Shoulder abduction    Shoulder adduction    Shoulder internal rotation    Shoulder external rotation    Middle trapezius    Lower trapezius    Elbow flexion    Elbow extension    Wrist flexion    Wrist extension    Wrist ulnar deviation    Wrist radial deviation    Wrist pronation    Wrist supination    Grip strength (lbs)    (Blank rows = not tested)  TREATMENT 04/05/24 TherAct With BFR (140 mmHG LOP, exercises at 70 mmHG, cuff size 2; 30 sec rest between sets) Shoulder flexion 2# in Lt to 90 deg x 9 reps; then 1# x 11 reps; 2x20 reps Shoulder abduction 1# in Lt to 90 deg 3x20 reps IR/ER reactive isometrics L3 band 3x10 each  Rows L3 band 2x20 reps   03/29/24 TherAct UBE L4 x 6 min (3 min each direction) With BFR (110 mmHG LOP, exercises at 55 mmHG, cuff size 2; 30 sec rest between sets) Standing AA flexion with 1# bar 2x20 Standing AA abduction with 1# bar 2x20 Wall ladder flexion and abduction x10 each Rows L3 band 2x20  IR/ER reactive isometrics L3 band 2x10 each   03/25/2024 Supine IR stretch supine, elbow 70 degrees to side, reach towards pants pocket 20 x 10 seconds, should be no pain with this activity Biceps curls with supination with 3# 20 x 3 seconds Side-lie ER (anti-gravity) 2 sets of 10 x 3 seconds with 1#  Avoid aggressive shoulder extension and ER to minimize strain on the subscapularis revision.  OK to do a bit more with extension and external rotation now that we are beyond week 6 post-revision.  Functional Activities: Scapular retraction/shoulder blade pinches 10 x 5 seconds (Postural correction and scapular strengthening) Serratus anterior punch/Supine arm raise 2 sets of 20 x 3 seconds for reaching and scapular protraction with 2# Supine shoulder flexion AROM 10 x 10 seconds  (protract 1st with left palm facing in)   03/23/24 TherAct UBE L3 x 6 min (3 min each direction) Wall ladder flexion and scaption x 10 reps each on Lt AA flexion 2# bar 2x10; standing AA abduction 2# bar 2x10; standing Supine protraction 2x10 2# on Lt Sidelying ER 2x10; 2# weight; 1# weight Rows L4 band 2x10   03/18/2024 Supine IR stretch supine, elbow 70 degrees to side, reach towards pants pocket 20 x 10 seconds, should be no pain with this activity Biceps curls with supination with 1# 20 x 3 seconds Side-lie ER (anti-gravity) 10 x 3 seconds  Avoid aggressive shoulder extension and ER to minimize strain on the subscapularis revision.  Functional Activities: Scapular retraction/shoulder blade pinches 20 x 5 seconds (Postural correction and scapular strengthening) Serratus anterior punch/Supine arm raise 20 x 3 seconds for reaching and scapular protraction Supine shoulder flexion AROM 10 x 10 seconds (protract 1st with left palm facing in)   PATIENT EDUCATION: Education details: See above Person educated: Patient Education method: Explanation, Demonstration, Tactile cues, Verbal cues, and Handouts Education comprehension: verbalized understanding, returned demonstration, verbal cues required, tactile cues required, and needs further education  HOME EXERCISE PROGRAM: Access Code: [email or web address] URL: [email or web address] Date: 03/18/2024 Prepared by: Lamar Ivory  Exercises - Standing Scapular Retraction  - 5 x daily - 7 x weekly - 1 sets - 5 reps - 5 second hold - Supine Scapular Protraction in Flexion with Dumbbells  - 2 x daily - 7 x weekly - 1 sets - 20 reps - 3 seconds hold - Supine Shoulder Flexion PROM  - 1 x daily - 7 x weekly - 1 sets - 10 reps - 10 seconds hold - Supine Shoulder Internal Rotation Stretch  - 2 x daily - 7 x weekly - 1 sets - 10-20 reps - 10 seconds hold - Standing Bicep Curls with Dumbbells and Rotation  - 1-2 x daily - 7 x weekly - 1 sets -  20 reps - 3 seconds hold - Sidelying Shoulder External Rotation  Dumbbell  - 1 x daily - 7 x weekly - 1-2 sets - 10 reps - 3 second hold  ASSESSMENT:  CLINICAL IMPRESSION: Continued with BFR today to work on maximizing strength gains.  Continue skilled PT at this time.     OBJECTIVE IMPAIRMENTS: decreased activity tolerance, decreased endurance, decreased knowledge of condition, decreased ROM, decreased strength, decreased safety awareness, increased edema, impaired perceived functional ability, impaired UE functional use, and pain.   ACTIVITY LIMITATIONS: carrying, lifting, dressing, and reach over head  PARTICIPATION LIMITATIONS: cleaning, driving, community activity, occupation, and yard work  PERSONAL FACTORS: Left biceps tenodesis, left subscapularis repair 2023, left clavicle fracture 2011 are also affecting patient's functional outcome.   REHAB POTENTIAL: Good  CLINICAL DECISION MAKING: Evolving/moderate complexity  EVALUATION COMPLEXITY: Moderate   GOALS: Goals reviewed with patient? Yes  SHORT TERM GOALS: Target date: 03/25/2024  [name] will be independent with his day 1 home exercise program Baseline: Started 02/12/2024 Goal status: Met 03/04/2024  2.  Improve left shoulder passive range of motion for flexion to 135 degrees; internal rotation to 60 degrees; external rotation to 30 degrees and horizontal adduction to 30 degrees Baseline: 105; 50; 30 and 0 respectively Goal status: Met 03/25/2024  LONG TERM GOALS: Target date: 05/06/2024  Improve patient specific functional score to at least 6 Baseline: 1.25 Goal status: INITIAL  2.  [name] will be able to maintain left shoulder pain at a level of 2/10 or better on the visual analog scale Baseline: 2/10 72 hours post-surgery Goal status: INITIAL  3.  Improve left shoulder active range of motion to 90% or better of the uninvolved right Baseline: See objective Goal status: INITIAL  4.  Improve left shoulder strength  for internal/external rotation to 60% of the uninvolved right at 12 weeks postsurgery (9 months to a year expected for 90% plus strength after the revision) Baseline: Deferred secondary to surgery 72 hours ago Goal status: INITIAL  5.  [name] will be independent with his long-term maintenance home exercise program at discharge Baseline: Started 02/12/2024 Goal status: INITIAL   PLAN:  PT FREQUENCY: 2x/week  PT DURATION: 12 weeks  PLANNED INTERVENTIONS: 97750- Physical Performance Testing, 97110-Therapeutic exercises, 97530- Therapeutic activity, 97112- Neuromuscular re-education, 97535- Self Care, [id number]- Manual therapy, [id number]- Electrical stimulation (unattended), [id number]- Vasopneumatic device, Patient/Family education, Joint mobilization, and Cryotherapy  PLAN FOR NEXT SESSION: Continue BFR if pt wishes,  Progression of AAROM/AROM including conservative external rotation and extension.  Okay to progress resistance with activities with the exception of internal rotation and aggressive external rotation or extension stretching.  AVOID IR against resistance.    02/26/24- per MD note from 02/24/24 (updated surgical precautions)  2 weeks status post left shoulder subscapularis repair open overall doing very well. At today's visit I did describe that I would recommend allowed active forward elevation in the scapular plane as well as external rotation. I did describe that mildly restriction would be no internal rotation against weight or no going behind the back. Other than that he may begin active forward elevation as well as external rotation     Corean JULIANNA Ku, PT, DPT 04/05/24 9:46 AM

## 2024-04-08 ENCOUNTER — Encounter: Admitting: Physical Therapy

## 2024-04-12 ENCOUNTER — Ambulatory Visit (INDEPENDENT_AMBULATORY_CARE_PROVIDER_SITE_OTHER): Admitting: Physical Therapy

## 2024-04-12 ENCOUNTER — Encounter: Payer: Self-pay | Admitting: Physical Therapy

## 2024-04-12 ENCOUNTER — Encounter: Admitting: Physical Therapy

## 2024-04-12 DIAGNOSIS — R600 Localized edema: Secondary | ICD-10-CM | POA: Diagnosis not present

## 2024-04-12 DIAGNOSIS — M25612 Stiffness of left shoulder, not elsewhere classified: Secondary | ICD-10-CM

## 2024-04-12 DIAGNOSIS — M6281 Muscle weakness (generalized): Secondary | ICD-10-CM | POA: Diagnosis not present

## 2024-04-12 DIAGNOSIS — M25512 Pain in left shoulder: Secondary | ICD-10-CM | POA: Diagnosis not present

## 2024-04-12 NOTE — Therapy (Signed)
 OUTPATIENT PHYSICAL THERAPY TREATMENT      Patient Name: Kent Johnson MRN: [id number] DOB:06-27-1986, 38 y.o., male Today's Date: 04/12/2024  END OF SESSION:  PT End of Session - 04/12/24 0844     Visit Number 12    Number of Visits 24    Date for Recertification  05/06/24    Authorization Type [employer] (no auth needed)    Authorization - Visit Number 12    Authorization - Number of Visits 24    PT Start Time 0845    PT Stop Time 0925    PT Time Calculation (min) 40 min    Activity Tolerance Patient tolerated treatment well;No increased pain    Behavior During Therapy [facility] for tasks assessed/performed               Past Medical History:  Diagnosis Date   [place]   [place]    Arthritis 2021   rt knee   Routine general medical examination at a health care facility 04/08/2023   Past Surgical History:  Procedure Laterality Date   BICEPT TENODESIS Left 02/09/2024   Procedure: RIGHT PECTORAL BICEPS TENODESIS / POSSIBLE DERMAL AUGMENTATION;  Surgeon: Genelle Standing, MD;  Location: [facility];  Service: Orthopedics;  Laterality: Left;   CLAVICLE SURGERY  2011   broke collar bone skiing   COLON SURGERY  around 2007   benign lipoma inside of colon lead to intusseption   LIPOMA EXCISION  2007   benign lipoma in colon, caused intersepcion   OPEN SUBSCAPULARIS REPAIR Left 02/09/2024   Procedure: LEFT SHOULDER OPEN SUBSCAPULARIS REPAIR;  Surgeon: Genelle Standing, MD;  Location: [facility];  Service: Orthopedics;  Laterality: Left;   SHOULDER ARTHROSCOPY Left 02/09/2024   Procedure: ARTHROSCOPY, SHOULDER;  Surgeon: Genelle Standing, MD;  Location: [facility];  Service: Orthopedics;  Laterality: Left;   SHOULDER ARTHROSCOPY WITH DEBRIDEMENT AND BICEP TENDON REPAIR Left 03/11/2022   Procedure: LEFT SHOULDER ARTHROSCOPY WITH MINI OPEN SUBSCAPULARIS REPAIR  AND BICEP TENDODESIS;  Surgeon: Genelle Standing, MD;   Location: [facility];  Service: Orthopedics;  Laterality: Left;   WOUND EXPLORATION N/A 05/23/2022   Procedure: WOUND EXPLORATION WITH REMOVAL OF FOREIGN BODY OF ABDOMINAL WALL;  Surgeon: Eletha Boas, MD;  Location: [facility];  Service: General;  Laterality: N/A;   Patient Active Problem List   Diagnosis Date Noted   Routine general medical examination at a health care facility 04/08/2023   Foreign body in anterior abdominal wall 05/18/2022   Avulsion of left subscapularis    Anxiety disorder 11/11/2021   Insomnia 11/11/2021    PCP: [facility]  REFERRING PROVIDER: Standing Genelle, MD  REFERRING DIAG: S46.892A (ICD-10-CM) - Avulsion of left subscapularis, initial encounter  THERAPY DIAG:  Muscle weakness (generalized)  Stiffness of left shoulder, not elsewhere classified  Acute pain of left shoulder  Localized edema  Rationale for Evaluation and Treatment: Rehabilitation  ONSET DATE: 02/09/2024  SUBJECTIVE:  SUBJECTIVE STATEMENT: Working on slowly increasing resistance  Hand dominance: Left  PERTINENT HISTORY:  Left biceps tenodesis, left subscapularis repair 2023, left clavicle fracture 2011  PAIN:  NPRS scale: 1-2/10 this week Pain location: Lt pectoralis and anterior shoulder Pain description: Slight ache/cramp  Aggravating factors: Overuse Relieving factors: Rest and exercise  PRECAUTIONS: Shoulder-revision.  This is [name]'s second subscapularis repair on the left side so we will need to be conservative with starting strengthening given his revision status.  Recommend holding off until the end of week 10 post-surgery before beginning any resisted strength work.  RED FLAGS: None   WEIGHT BEARING RESTRICTIONS: Yes No left upper extremity  weight-bearing  FALLS:  Has patient fallen in last 6 months? No  LIVING ENVIRONMENT: Lives with: lives with their family, lives with their spouse, and with 3 children Lives in: House/apartment Stairs: No problems Has following equipment at home: Lt shoulder brace  OCCUPATION: Sport and exercise psychologist, power infrastructure  PLOF: Independent  PATIENT GOALS:Return to swimming, yoga and ADLs without restrictions  NEXT MD VISIT: May 04, 2024  OBJECTIVE:   Note: Objective measures were completed at Evaluation unless otherwise noted.  DIAGNOSTIC FINDINGS:  1. Prior rotator cuff repair. Moderate supraspinatus tendinosis. 2. High-grade partial, near complete, tear of the subscapularis tendon. 3. No labral tear.  PATIENT SURVEYS:  PSFS: THE PATIENT SPECIFIC FUNCTIONAL SCALE  Place score of 0-10 (0 = unable to perform activity and 10 = able to perform activity at the same level as before injury or problem)  Activity Date: 02/12/2024 03/25/2024   Swimming 1 5   2.  Lifting the arm straight out 1 2   3.  Shoulder press at the gym 0 0   4.  Yoga 3 3   Total Score 1.25 2.5     Total Score = Sum of activity scores/number of activities  Minimally Detectable Change: 3 points (for single activity); 2 points (for average score)   COGNITION: 02/12/2024 Overall cognitive status: Within functional limits for tasks assessed     SENSATION: 02/12/2024 [facility]  POSTURE: 02/12/2024 No significant postural issues noted  UPPER EXTREMITY ROM:   ROM Left/Right 02/12/2024 PROM Left 03/04/2024 PROM Left 03/11/2024 In supine Left  03/18/2024 Left  03/25/2024  Shoulder flexion 105/175 130 140 AAROM 150 150  Shoulder extension       Shoulder abduction   112 AAROM in supine    Shoulder horizontal adduction 0/35   30 35  Shoulder internal rotation 50/70 40  40 60  Shoulder external rotation 30/105 45 52 AAROM in supine 45 deg abduction.  60 60  (Blank rows = not tested)  UPPER EXTREMITY STRENGTH:  Deferred due to being 72 hours post-surgery      Shoulder flexion    Shoulder extension    Shoulder abduction    Shoulder adduction    Shoulder internal rotation    Shoulder external rotation    Middle trapezius    Lower trapezius    Elbow flexion    Elbow extension    Wrist flexion    Wrist extension    Wrist ulnar deviation    Wrist radial deviation    Wrist pronation    Wrist supination    Grip strength (lbs)    (Blank rows = not tested)  TREATMENT 04/12/24 TherAct UBE L5 x 6 min (3 min each direction) IR/ER reactive isometrics 10# 2x10 on Lt Lt row with cable 20# 2x10 Lat pull downs 25# 2x10 Chest press 25# 2x10 Lt bicep curls 2x10; 8# Lt overhead press 2# 2x10 Counter height plank with alt shoulder taps 2x10 bil Wall push ups  2x10; small range Body blade 2x30 sec in flexion/ER Provided beach ball for AAROM at home   04/05/24 TherAct With BFR (140 mmHG LOP, exercises at 70 mmHG, cuff size 2; 30 sec rest between sets) Shoulder flexion 2# in Lt to 90 deg x 9 reps; then 1# x 11 reps; 2x20 reps Shoulder abduction 1# in Lt to 90 deg 3x20 reps IR/ER reactive isometrics L3 band 3x10 each  Rows L3 band 2x20 reps   03/29/24 TherAct UBE L4 x 6 min (3 min each direction) With BFR (110 mmHG LOP, exercises at 55 mmHG, cuff size 2; 30 sec rest between sets) Standing AA flexion with 1# bar 2x20 Standing AA abduction with 1# bar 2x20 Wall ladder flexion and abduction x10 each Rows L3 band 2x20  IR/ER reactive isometrics L3 band 2x10 each   03/25/2024 Supine IR stretch supine, elbow 70 degrees to side, reach towards pants pocket 20 x 10 seconds, should be no pain with this activity Biceps curls with supination with 3# 20 x 3 seconds Side-lie ER (anti-gravity) 2 sets of 10 x 3 seconds with 1#  Avoid aggressive shoulder extension and ER to minimize strain on the  subscapularis revision.  OK to do a bit more with extension and external rotation now that we are beyond week 6 post-revision.  Functional Activities: Scapular retraction/shoulder blade pinches 10 x 5 seconds (Postural correction and scapular strengthening) Serratus anterior punch/Supine arm raise 2 sets of 20 x 3 seconds for reaching and scapular protraction with 2# Supine shoulder flexion AROM 10 x 10 seconds (protract 1st with left palm facing in)   03/23/24 TherAct UBE L3 x 6 min (3 min each direction) Wall ladder flexion and scaption x 10 reps each on Lt AA flexion 2# bar 2x10; standing AA abduction 2# bar 2x10; standing Supine protraction 2x10 2# on Lt Sidelying ER 2x10; 2# weight; 1# weight Rows L4 band 2x10   03/18/2024 Supine IR stretch supine, elbow 70 degrees to side, reach towards pants pocket 20 x 10 seconds, should be no pain with this activity Biceps curls with supination with 1# 20 x 3 seconds Side-lie ER (anti-gravity) 10 x 3 seconds  Avoid aggressive shoulder extension and ER to minimize strain on the subscapularis revision.  Functional Activities: Scapular retraction/shoulder blade pinches 20 x 5 seconds (Postural correction and scapular strengthening) Serratus anterior punch/Supine arm raise 20 x 3 seconds for reaching and scapular protraction Supine shoulder flexion AROM 10 x 10 seconds (protract 1st with left palm facing in)   PATIENT EDUCATION: Education details: See above Person educated: Patient Education method: Explanation, Demonstration, Tactile cues, Verbal cues, and Handouts Education comprehension: verbalized understanding, returned demonstration, verbal cues required, tactile cues required, and needs further education  HOME EXERCISE PROGRAM: Access Code: [email or web address] URL: [email or web address] Date: 03/18/2024 Prepared by: Lamar Ivory  Exercises - Standing Scapular Retraction  - 5 x daily - 7 x weekly - 1 sets - 5 reps - 5 second  hold - Supine Scapular Protraction in Flexion with Dumbbells  - 2 x daily - 7 x weekly - 1 sets - 20 reps - 3 seconds hold - Supine Shoulder Flexion PROM  -  1 x daily - 7 x weekly - 1 sets - 10 reps - 10 seconds hold - Supine Shoulder Internal Rotation Stretch  - 2 x daily - 7 x weekly - 1 sets - 10-20 reps - 10 seconds hold - Standing Bicep Curls with Dumbbells and Rotation  - 1-2 x daily - 7 x weekly - 1 sets - 20 reps - 3 seconds hold - Sidelying Shoulder External Rotation Dumbbell  - 1 x daily - 7 x weekly - 1-2 sets - 10 reps - 3 second hold  ASSESSMENT:  CLINICAL IMPRESSION: Pt tolerated session well today with progression of strengthening exercises.  Able to progress to cables with resistance and can work on these exercises at the gym.  Will continue to benefit from PT to maximize function.  OBJECTIVE IMPAIRMENTS: decreased activity tolerance, decreased endurance, decreased knowledge of condition, decreased ROM, decreased strength, decreased safety awareness, increased edema, impaired perceived functional ability, impaired UE functional use, and pain.   ACTIVITY LIMITATIONS: carrying, lifting, dressing, and reach over head  PARTICIPATION LIMITATIONS: cleaning, driving, community activity, occupation, and yard work  PERSONAL FACTORS: Left biceps tenodesis, left subscapularis repair 2023, left clavicle fracture 2011 are also affecting patient's functional outcome.   REHAB POTENTIAL: Good  CLINICAL DECISION MAKING: Evolving/moderate complexity  EVALUATION COMPLEXITY: Moderate   GOALS: Goals reviewed with patient? Yes  SHORT TERM GOALS: Target date: 03/25/2024  [name] will be independent with his day 1 home exercise program Baseline: Started 02/12/2024 Goal status: Met 03/04/2024  2.  Improve left shoulder passive range of motion for flexion to 135 degrees; internal rotation to 60 degrees; external rotation to 30 degrees and horizontal adduction to 30 degrees Baseline: 105; 50; 30  and 0 respectively Goal status: Met 03/25/2024  LONG TERM GOALS: Target date: 05/06/2024  Improve patient specific functional score to at least 6 Baseline: 1.25 Goal status: INITIAL  2.  [name] will be able to maintain left shoulder pain at a level of 2/10 or better on the visual analog scale Baseline: 2/10 72 hours post-surgery Goal status: INITIAL  3.  Improve left shoulder active range of motion to 90% or better of the uninvolved right Baseline: See objective Goal status: INITIAL  4.  Improve left shoulder strength for internal/external rotation to 60% of the uninvolved right at 12 weeks postsurgery (9 months to a year expected for 90% plus strength after the revision) Baseline: Deferred secondary to surgery 72 hours ago Goal status: INITIAL  5.  [name] will be independent with his long-term maintenance home exercise program at discharge Baseline: Started 02/12/2024 Goal status: INITIAL   PLAN:  PT FREQUENCY: 2x/week  PT DURATION: 12 weeks  PLANNED INTERVENTIONS: 97750- Physical Performance Testing, 97110-Therapeutic exercises, 97530- Therapeutic activity, 97112- Neuromuscular re-education, 97535- Self Care, [id number]- Manual therapy, [id number]- Electrical stimulation (unattended), [id number]- Vasopneumatic device, Patient/Family education, Joint mobilization, and Cryotherapy  PLAN FOR NEXT SESSION: Progression of AAROM/AROM; continue with strengthening as tolerated now that pt is 8 weeks P/O    02/26/24- per MD note from 02/24/24 (updated surgical precautions)  2 weeks status post left shoulder subscapularis repair open overall doing very well. At today's visit I did describe that I would recommend allowed active forward elevation in the scapular plane as well as external rotation. I did describe that mildly restriction would be no internal rotation against weight or no going behind the back. Other than that he may begin active forward elevation as well as external rotation     Corean FALCON  Alvia, PT, DPT 04/12/24 9:28 AM

## 2024-04-15 ENCOUNTER — Ambulatory Visit: Admitting: Rehabilitative and Restorative Service Providers"

## 2024-04-15 ENCOUNTER — Encounter: Payer: Self-pay | Admitting: Rehabilitative and Restorative Service Providers"

## 2024-04-15 DIAGNOSIS — M25512 Pain in left shoulder: Secondary | ICD-10-CM | POA: Diagnosis not present

## 2024-04-15 DIAGNOSIS — M6281 Muscle weakness (generalized): Secondary | ICD-10-CM

## 2024-04-15 DIAGNOSIS — R600 Localized edema: Secondary | ICD-10-CM

## 2024-04-15 DIAGNOSIS — M25612 Stiffness of left shoulder, not elsewhere classified: Secondary | ICD-10-CM | POA: Diagnosis not present

## 2024-04-15 NOTE — Therapy (Signed)
 OUTPATIENT PHYSICAL THERAPY TREATMENT      Patient Name: Kent Johnson MRN: [id number] DOB:[birth year], 38 y.o., male Today's Date: 04/15/2024  END OF SESSION:  PT End of Session - 04/15/24 0927     Visit Number 13    Number of Visits 24    Date for Recertification  05/06/24    Authorization Type [employer] (no auth needed)    Authorization - Number of Visits 24    PT Start Time 0846    PT Stop Time 0927    PT Time Calculation (min) 41 min    Activity Tolerance Patient tolerated treatment well;No increased pain    Behavior During Therapy [facility] for tasks assessed/performed                Past Medical History:  Diagnosis Date   [place]   [place]    Arthritis 2021   rt knee   Routine general medical examination at a health care facility 04/08/2023   Past Surgical History:  Procedure Laterality Date   BICEPT TENODESIS Left 02/09/2024   Procedure: RIGHT PECTORAL BICEPS TENODESIS / POSSIBLE DERMAL AUGMENTATION;  Surgeon: Kent Standing, MD;  Location: [facility];  Service: Orthopedics;  Laterality: Left;   CLAVICLE SURGERY  2011   broke collar bone skiing   COLON SURGERY  around 2007   benign lipoma inside of colon lead to intusseption   LIPOMA EXCISION  2007   benign lipoma in colon, caused intersepcion   OPEN SUBSCAPULARIS REPAIR Left 02/09/2024   Procedure: LEFT SHOULDER OPEN SUBSCAPULARIS REPAIR;  Surgeon: Kent Standing, MD;  Location: [facility];  Service: Orthopedics;  Laterality: Left;   SHOULDER ARTHROSCOPY Left 02/09/2024   Procedure: ARTHROSCOPY, SHOULDER;  Surgeon: Kent Standing, MD;  Location: [facility];  Service: Orthopedics;  Laterality: Left;   SHOULDER ARTHROSCOPY WITH DEBRIDEMENT AND BICEP TENDON REPAIR Left 03/11/2022   Procedure: LEFT SHOULDER ARTHROSCOPY WITH MINI OPEN SUBSCAPULARIS REPAIR  AND BICEP TENDODESIS;  Surgeon: Kent Standing, MD;  Location: [facility];  Service: Orthopedics;  Laterality: Left;   WOUND EXPLORATION N/A 05/23/2022   Procedure: WOUND EXPLORATION WITH REMOVAL OF FOREIGN BODY OF ABDOMINAL WALL;  Surgeon: Kent Boas, MD;  Location: [facility];  Service: General;  Laterality: N/A;   Patient Active Problem List   Diagnosis Date Noted   Routine general medical examination at a health care facility 04/08/2023   Foreign body in anterior abdominal wall 05/18/2022   Avulsion of left subscapularis    Anxiety disorder 11/11/2021   Insomnia 11/11/2021    PCP: Kent Johnson  REFERRING PROVIDER: Standing Genelle, MD  REFERRING DIAG: S46.892A (ICD-10-CM) - Avulsion of left subscapularis, initial encounter  THERAPY DIAG:  Muscle weakness (generalized)  Stiffness of left shoulder, not elsewhere classified  Acute pain of left shoulder  Localized edema  Rationale for Evaluation and Treatment: Rehabilitation  ONSET DATE: 02/09/2024  SUBJECTIVE:  SUBJECTIVE STATEMENT: [name] mentioned that Dr. Genelle told him the reason for his revision was that an anchor had failed and the tendon was in good shape.  He does not require pain medication and reports more soreness than pain at this point in his rehabilitation.  Hand dominance: Left  PERTINENT HISTORY:  Left biceps tenodesis, left subscapularis repair 2023, left clavicle fracture 2011  PAIN:  NPRS scale: 0-3/10 this week Pain location: Lt pectoralis and anterior shoulder Pain description: Slight soreness/ache  Aggravating factors: Overuse Relieving factors: Rest and exercise  PRECAUTIONS: Shoulder-revision.  This is Kent Johnson's second subscapularis repair on the left side so we will need to be conservative with starting strengthening given his revision status.  Recommend holding off until  the end of week 10 post-surgery before beginning any resisted strength work.  RED FLAGS: None   WEIGHT BEARING RESTRICTIONS: Yes No left upper extremity weight-bearing  FALLS:  Has patient fallen in last 6 months? No  LIVING ENVIRONMENT: Lives with: lives with their family, lives with their spouse, and with 3 children Lives in: House/apartment Stairs: No problems Has following equipment at home: Lt shoulder brace  OCCUPATION: Sport and exercise psychologist, power infrastructure  PLOF: Independent  PATIENT GOALS:Return to swimming, yoga and ADLs without restrictions  NEXT MD VISIT: May 04, 2024  OBJECTIVE:   Note: Objective measures were completed at Evaluation unless otherwise noted.  DIAGNOSTIC FINDINGS:  1. Prior rotator cuff repair. Moderate supraspinatus tendinosis. 2. High-grade partial, near complete, tear of the subscapularis tendon. 3. No labral tear.  PATIENT SURVEYS:  PSFS: THE PATIENT SPECIFIC FUNCTIONAL SCALE  Place score of 0-10 (0 = unable to perform activity and 10 = able to perform activity at the same level as before injury or problem)  Activity Date: 02/12/2024 03/25/2024 04/15/2024  Swimming 1 5 5   2.  Lifting the arm straight out 1 2 6   3.  Shoulder press at the gym 0 0 2  4.  Yoga 3 3 3   Total Score 1.25 2.5 4    Total Score = Sum of activity scores/number of activities  Minimally Detectable Change: 3 points (for single activity); 2 points (for average score)   COGNITION: 02/12/2024 Overall cognitive status: Within functional limits for tasks assessed     SENSATION: 02/12/2024 Kent Johnson  POSTURE: 02/12/2024 No significant postural issues noted  UPPER EXTREMITY ROM:   ROM Left/Right 02/12/2024 PROM Left 03/04/2024 PROM Left 03/11/2024 In supine Left  03/18/2024 Left  03/25/2024 Left 04/15/2024  Shoulder flexion 105/175 130 140 AAROM 150 150 150/170  Shoulder extension        Shoulder abduction   112 AAROM in supine     Shoulder horizontal adduction  0/35   30 35 45/60  Shoulder internal rotation 50/70 40  40 60 45/80  Shoulder external rotation 30/105 45 52 AAROM in supine 45 deg abduction.  60 60 80/105  (Blank rows = not tested)  UPPER EXTREMITY STRENGTH: Deferred due to being 72 hours post-surgery      Shoulder flexion    Shoulder extension    Shoulder abduction    Shoulder adduction    Shoulder internal rotation    Shoulder external rotation    Middle trapezius    Lower trapezius    Elbow flexion    Elbow extension    Wrist flexion    Wrist extension    Wrist ulnar deviation    Wrist radial deviation    Wrist pronation    Wrist supination    Grip  strength (lbs)    (Blank rows = not tested)                                                                                                                 TREATMENT 04/15/2024 Shoulder blade pinches/scapular protraction 10 x 5 seconds Supine IR/ER stretch at 70 degrees abduction 10 x 10 seconds each Side-lie ER 2 sets of 10 with 3# Posterior capsule stretch 10 x 10 seconds  Functional Activities: Supine arm raises/scapular protraction 30 x 3 seconds with 5# (reaching and overhead function) Pull to chest/Rows Blue Thera-Band 20 x (posture and scapulo-humeral proprioception) Supine shoulder flexion AROM (protract 1st, palm in) 10 x 10 seconds (overhead function)   04/12/24 TherAct UBE L5 x 6 min (3 min each direction) IR/ER reactive isometrics 10# 2x10 on Lt Lt row with cable 20# 2x10 Lat pull downs 25# 2x10 Chest press 25# 2x10 Lt bicep curls 2x10; 8# Lt overhead press 2# 2x10 Counter height plank with alt shoulder taps 2x10 bil Wall push ups  2x10; small range Body blade 2x30 sec in flexion/ER Provided beach ball for AAROM at home   04/05/24 TherAct With BFR (140 mmHG LOP, exercises at 70 mmHG, cuff size 2; 30 sec rest between sets) Shoulder flexion 2# in Lt to 90 deg x 9 reps; then 1# x 11 reps; 2x20 reps Shoulder abduction 1# in Lt to 90 deg 3x20  reps IR/ER reactive isometrics L3 band 3x10 each  Rows L3 band 2x20 reps    PATIENT EDUCATION: Education details: See above Person educated: Patient Education method: Explanation, Demonstration, Tactile cues, Verbal cues, and Handouts Education comprehension: verbalized understanding, returned demonstration, verbal cues required, tactile cues required, and needs further education  HOME EXERCISE PROGRAM: Access Code: [email or web address] URL: [email or web address] Date: 04/15/2024 Prepared by: Lamar Ivory  Exercises - Johnson Scapular Retraction  - 5 x daily - 7 x weekly - 1 sets - 5 reps - 5 second hold - Supine Scapular Protraction in Flexion with Dumbbells  - 1 x daily - 7 x weekly - 2 sets - 20 reps - 3 seconds hold - Supine Shoulder Internal Rotation Stretch  - 1-2 x daily - 7 x weekly - 1 sets - 10-20 reps - 10 seconds hold - Sidelying Shoulder External Rotation Dumbbell  - 1 x daily - 7 x weekly - 2 sets - 10 reps - 3 second hold - Supine Shoulder External Rotation Stretch  - 1-2 x daily - 7 x weekly - 1 sets - 10 reps - 10 seconds hold - Supine Shoulder Flexion Extension Full Range AROM  - 1 x daily - 7 x weekly - 1 sets - 10 reps - 10 seconds hold  ASSESSMENT:  CLINICAL IMPRESSION: Left shoulder AROM is now 77% of the uninvolved right (Goal is 90% at 12 weeks, currently 9.5 weeks post-surgery).  [name] let me know today that the reason for his revision was an anchor problem, not a tendon problem.  This is very important for his long-term recovery and  means he should have a better outcome as compared to a second tendon tear.  We discussed getting an objective strength assessment next week.  If appropriate, we may start isolated rotator cuff strengthening.   OBJECTIVE IMPAIRMENTS: decreased activity tolerance, decreased endurance, decreased knowledge of condition, decreased ROM, decreased strength, decreased safety awareness, increased edema, impaired perceived functional  ability, impaired UE functional use, and pain.   ACTIVITY LIMITATIONS: carrying, lifting, dressing, and reach over head  PARTICIPATION LIMITATIONS: cleaning, driving, community activity, occupation, and yard work  PERSONAL FACTORS: Left biceps tenodesis, left subscapularis repair 2023, left clavicle fracture 2011 are also affecting patient's functional outcome.   REHAB POTENTIAL: Good  CLINICAL DECISION MAKING: Evolving/moderate complexity  EVALUATION COMPLEXITY: Moderate   GOALS: Goals reviewed with patient? Yes  SHORT TERM GOALS: Target date: 03/25/2024  [name] will be independent with his day 1 home exercise program Baseline: Started 02/12/2024 Goal status: Met 03/04/2024  2.  Improve left shoulder passive range of motion for flexion to 135 degrees; internal rotation to 60 degrees; external rotation to 30 degrees and horizontal adduction to 30 degrees Baseline: 105; 50; 30 and 0 respectively Goal status: Met 03/25/2024  LONG TERM GOALS: Target date: 05/06/2024  Improve patient specific functional score to at least 6 Baseline: 1.25 Goal status: Ongoing 04/15/2024  2.  [name] will be able to maintain left shoulder pain at a level of 2/10 or better on the visual analog scale Baseline: 2/10 72 hours post-surgery Goal status: Ongoing 04/15/2024  3.  Improve left shoulder active range of motion to 90% or better of the uninvolved right Baseline: See objective Goal status: Ongoing 04/15/2024  4.  Improve left shoulder strength for internal/external rotation to 60% of the uninvolved right at 12 weeks postsurgery (9 months to a year expected for 90% plus strength after the revision) Baseline: Deferred secondary to surgery 72 hours ago Goal status: INITIAL  5.  [name] will be independent with his long-term maintenance home exercise program at discharge Baseline: Started 02/12/2024 Goal status: INITIAL   PLAN:  PT FREQUENCY: 2x/week  PT DURATION: 12 weeks  PLANNED INTERVENTIONS:  97750- Physical Performance Testing, 97110-Therapeutic exercises, 97530- Therapeutic activity, 97112- Neuromuscular re-education, 97535- Self Care, [id number]- Manual therapy, [id number]- Electrical stimulation (unattended), [id number]- Vasopneumatic device, Patient/Family education, Joint mobilization, and Cryotherapy  PLAN FOR NEXT SESSION: Objective strength reassessment.  Okay to progress into isolated rotator cuff strengthening as long as strength assessment shows adequate strength.    02/26/24- per MD note from 02/24/24 (updated surgical precautions)  2 weeks status post left shoulder subscapularis repair open overall doing very well. At today's visit I did describe that I would recommend allowed active forward elevation in the scapular plane as well as external rotation. I did describe that mildly restriction would be no internal rotation against weight or no going behind the back. Other than that he may begin active forward elevation as well as external rotation     Myer LELON Ivory PT, MPT 04/15/24 5:22 PM

## 2024-04-19 ENCOUNTER — Encounter: Admitting: Physical Therapy

## 2024-04-22 ENCOUNTER — Ambulatory Visit: Admitting: Rehabilitative and Restorative Service Providers"

## 2024-04-22 ENCOUNTER — Encounter: Payer: Self-pay | Admitting: Rehabilitative and Restorative Service Providers"

## 2024-04-22 DIAGNOSIS — M6281 Muscle weakness (generalized): Secondary | ICD-10-CM

## 2024-04-22 DIAGNOSIS — R600 Localized edema: Secondary | ICD-10-CM | POA: Diagnosis not present

## 2024-04-22 DIAGNOSIS — M25612 Stiffness of left shoulder, not elsewhere classified: Secondary | ICD-10-CM

## 2024-04-22 DIAGNOSIS — M25512 Pain in left shoulder: Secondary | ICD-10-CM

## 2024-04-22 NOTE — Therapy (Signed)
 OUTPATIENT PHYSICAL THERAPY TREATMENT      Patient Name: Kent Johnson MRN: [id number] DOB:10/16/1985, 38 y.o., male Today's Date: 04/22/2024  END OF SESSION:  PT End of Session - 04/22/24 0935     Visit Number 14    Number of Visits 24    Date for Recertification  05/06/24    Authorization Type [employer] (no auth needed)    Authorization - Number of Visits 24    PT Start Time 0847    PT Stop Time 0933    PT Time Calculation (min) 46 min    Activity Tolerance Patient tolerated treatment well;No increased pain    Behavior During Therapy [facility] for tasks assessed/performed                 Past Medical History:  Diagnosis Date   [place]   [place]    Arthritis 2021   rt knee   Routine general medical examination at a health care facility 04/08/2023   Past Surgical History:  Procedure Laterality Date   BICEPT TENODESIS Left 02/09/2024   Procedure: RIGHT PECTORAL BICEPS TENODESIS / POSSIBLE DERMAL AUGMENTATION;  Surgeon: Genelle Standing, MD;  Location: [facility];  Service: Orthopedics;  Laterality: Left;   CLAVICLE SURGERY  2011   broke collar bone skiing   COLON SURGERY  around 2007   benign lipoma inside of colon lead to intusseption   LIPOMA EXCISION  2007   benign lipoma in colon, caused intersepcion   OPEN SUBSCAPULARIS REPAIR Left 02/09/2024   Procedure: LEFT SHOULDER OPEN SUBSCAPULARIS REPAIR;  Surgeon: Genelle Standing, MD;  Location: [facility];  Service: Orthopedics;  Laterality: Left;   SHOULDER ARTHROSCOPY Left 02/09/2024   Procedure: ARTHROSCOPY, SHOULDER;  Surgeon: Genelle Standing, MD;  Location: [facility];  Service: Orthopedics;  Laterality: Left;   SHOULDER ARTHROSCOPY WITH DEBRIDEMENT AND BICEP TENDON REPAIR Left 03/11/2022   Procedure: LEFT SHOULDER ARTHROSCOPY WITH MINI OPEN SUBSCAPULARIS REPAIR  AND BICEP TENDODESIS;  Surgeon: Genelle Standing, MD;  Location: [facility];  Service: Orthopedics;  Laterality: Left;   WOUND EXPLORATION N/A 05/23/2022   Procedure: WOUND EXPLORATION WITH REMOVAL OF FOREIGN BODY OF ABDOMINAL WALL;  Surgeon: Eletha Boas, MD;  Location: [facility];  Service: General;  Laterality: N/A;   Patient Active Problem List   Diagnosis Date Noted   Routine general medical examination at a health care facility 04/08/2023   Foreign body in anterior abdominal wall 05/18/2022   Avulsion of left subscapularis    Anxiety disorder 11/11/2021   Insomnia 11/11/2021    PCP: [facility]  REFERRING PROVIDER: Standing Genelle, MD  REFERRING DIAG: S46.892A (ICD-10-CM) - Avulsion of left subscapularis, initial encounter  THERAPY DIAG:  Muscle weakness (generalized)  Stiffness of left shoulder, not elsewhere classified  Acute pain of left shoulder  Localized edema  Rationale for Evaluation and Treatment: Rehabilitation  ONSET DATE: 02/09/2024  SUBJECTIVE:  SUBJECTIVE STATEMENT: Kent Johnson continues to have less pain and improved function.  HEP compliance remains good.  Hand dominance: Left  PERTINENT HISTORY: Left biceps tenodesis, left subscapularis repair 2023, left clavicle fracture 2011  PAIN:  NPRS scale: 0-3/10 this week Pain location: Lt pectoralis and anterior shoulder Pain description: Slight soreness/ache  Aggravating factors: Overuse Relieving factors: Rest and exercise  PRECAUTIONS: Shoulder-revision.  This is Kent Johnson's second subscapularis repair on the left side so we will need to be conservative with starting strengthening given his revision status.  Recommend holding off until the end of week 10 post-surgery before beginning any resisted strength work.  RED FLAGS: None   WEIGHT BEARING RESTRICTIONS: Yes No left upper extremity  weight-bearing  FALLS:  Has patient fallen in last 6 months? No  LIVING ENVIRONMENT: Lives with: lives with their family, lives with their spouse, and with 3 children Lives in: House/apartment Stairs: No problems Has following equipment at home: Lt shoulder brace  OCCUPATION: Sport and exercise psychologist, power infrastructure  PLOF: Independent  PATIENT GOALS:Return to swimming, yoga and ADLs without restrictions  NEXT MD VISIT: May 04, 2024  OBJECTIVE:   Note: Objective measures were completed at Evaluation unless otherwise noted.  DIAGNOSTIC FINDINGS:  1. Prior rotator cuff repair. Moderate supraspinatus tendinosis. 2. High-grade partial, near complete, tear of the subscapularis tendon. 3. No labral tear.  PATIENT SURVEYS:  PSFS: THE PATIENT SPECIFIC FUNCTIONAL SCALE  Place score of 0-10 (0 = unable to perform activity and 10 = able to perform activity at the same level as before injury or problem)  Activity Date: 02/12/2024 03/25/2024 04/15/2024  Swimming 1 5 5   2.  Lifting the arm straight out 1 2 6   3.  Shoulder press at the gym 0 0 2  4.  Yoga 3 3 3   Total Score 1.25 2.5 4    Total Score = Sum of activity scores/number of activities  Minimally Detectable Change: 3 points (for single activity); 2 points (for average score)   COGNITION: 02/12/2024 Overall cognitive status: Within functional limits for tasks assessed     SENSATION: 02/12/2024 [facility]  POSTURE: 02/12/2024 No significant postural issues noted  UPPER EXTREMITY ROM:   ROM Left/Right 02/12/2024 PROM Left 03/04/2024 PROM Left 03/11/2024 In supine Left  03/18/2024 Left  03/25/2024 Left/Right 04/15/2024 Left 04/22/2024  Shoulder flexion 105/175 130 140 AAROM 150 150 150/170 160  Shoulder extension         Shoulder abduction   112 AAROM in supine      Shoulder horizontal adduction 0/35   30 35 45/60 50  Shoulder internal rotation 50/70 40  40 60 45/80 50  Shoulder external rotation 30/105 45 52 AAROM in supine  45 deg abduction.  60 60 80/105 90  (Blank rows = not tested)  UPPER EXTREMITY STRENGTH: Deferred due to being 72 hours post-surgery  In pounds with hand-held dynamometer 04/22/2024   Shoulder flexion    Shoulder extension    Shoulder abduction    Shoulder adduction    Shoulder internal rotation 43.1/17.3   Shoulder external rotation 27.2/19.1   Middle trapezius    Lower trapezius    Elbow flexion    Elbow extension    Wrist flexion    Wrist extension    Wrist ulnar deviation    Wrist radial deviation    Wrist pronation    Wrist supination    Grip strength (lbs)    (Blank rows = not tested)  TREATMENT 04/22/2024 Supine IR/ER stretch at 70 degrees abduction 10 x 10 seconds each Posterior capsule stretch 10 x 10 seconds Thera-Band ER Green 2 sets of 10 3 seconds, slow eccentrics Thera-Band IR Green 2 sets of 10 3 seconds, slow eccentrics  Functional Activities: Supine arm raises/scapular protraction 30 x 3 seconds with 7# (reaching and overhead function) Supine shoulder flexion AROM (protract 1st, palm in) 10 x 10 seconds (overhead function) Thumb up the back stretch (reaching behind) 10 x 10 seconds  [id number]: Reviewed and updated HEP; answered questions with week 12 and long-term expectations; discussed appropriate gym activities   04/15/2024 Shoulder blade pinches/scapular protraction 10 x 5 seconds Supine IR/ER stretch at 70 degrees abduction 10 x 10 seconds each Side-lie ER 2 sets of 10 with 3# Posterior capsule stretch 10 x 10 seconds  Functional Activities: Supine arm raises/scapular protraction 30 x 3 seconds with 5# (reaching and overhead function) Pull to chest/Rows Blue Thera-Band 20 x (posture and scapulo-humeral proprioception) Supine shoulder flexion AROM (protract 1st, palm in) 10 x 10 seconds (overhead function)   04/12/24 TherAct UBE L5 x 6  min (3 min each direction) IR/ER reactive isometrics 10# 2x10 on Lt Lt row with cable 20# 2x10 Lat pull downs 25# 2x10 Chest press 25# 2x10 Lt bicep curls 2x10; 8# Lt overhead press 2# 2x10 Counter height plank with alt shoulder taps 2x10 bil Wall push ups  2x10; small range Body blade 2x30 sec in flexion/ER Provided beach ball for AAROM at home   PATIENT EDUCATION: Education details: See above Person educated: Patient Education method: Explanation, Demonstration, Tactile cues, Verbal cues, and Handouts Education comprehension: verbalized understanding, returned demonstration, verbal cues required, tactile cues required, and needs further education  HOME EXERCISE PROGRAM: Access Code: [email or web address] URL: [email or web address] Date: 04/22/2024 Prepared by: Lamar Ivory  Exercises - Standing Scapular Retraction  - 5 x daily - 7 x weekly - 1 sets - 5 reps - 5 second hold - Supine Scapular Protraction in Flexion with Dumbbells  - 1 x daily - 7 x weekly - 2 sets - 30 reps - 3 seconds hold - Supine Shoulder Internal Rotation Stretch  - 1-2 x daily - 7 x weekly - 1 sets - 10-20 reps - 10 seconds hold - Supine Shoulder External Rotation Stretch  - 1-2 x daily - 7 x weekly - 1 sets - 10 reps - 10 seconds hold - Supine Shoulder Flexion Extension Full Range AROM  - 1 x daily - 7 x weekly - 1 sets - 10 reps - 10 seconds hold - Shoulder External Rotation with Anchored Resistance  - 1 x daily - 7 x weekly - 2 sets - 10 reps - 3 hold - Shoulder Internal Rotation with Resistance  - 1 x daily - 7 x weekly - 2 sets - 10 reps - 3 hold - Standing Shoulder Internal Rotation Stretch with Hands Behind Back  - 1 x daily - 7 x weekly - 1 sets - 10 reps - 10 seconds hold  ASSESSMENT:  CLINICAL IMPRESSION: Left shoulder AROM is now 91% of the long-term goal (Goal is 90% at 12 weeks, currently 10.5 weeks post-surgery).  Strength is 51% (Goal 60% at the end of week 12 post-surgery).  We made  appropriate rotator cuff strength progressions today and I anticipate he will meet all long-term goals.   OBJECTIVE IMPAIRMENTS: decreased activity tolerance, decreased endurance, decreased knowledge of condition, decreased ROM, decreased strength, decreased safety awareness, increased edema, impaired perceived functional ability,  impaired UE functional use, and pain.   ACTIVITY LIMITATIONS: carrying, lifting, dressing, and reach over head  PARTICIPATION LIMITATIONS: cleaning, driving, community activity, occupation, and yard work  PERSONAL FACTORS: Left biceps tenodesis, left subscapularis repair 2023, left clavicle fracture 2011 are also affecting patient's functional outcome.   REHAB POTENTIAL: Good  CLINICAL DECISION MAKING: Evolving/moderate complexity  EVALUATION COMPLEXITY: Moderate   GOALS: Goals reviewed with patient? Yes  SHORT TERM GOALS: Target date: 03/25/2024  Kent Johnson will be independent with his day 1 home exercise program Baseline: Started 02/12/2024 Goal status: Met 03/04/2024  2.  Improve left shoulder passive range of motion for flexion to 135 degrees; internal rotation to 60 degrees; external rotation to 30 degrees and horizontal adduction to 30 degrees Baseline: 105; 50; 30 and 0 respectively Goal status: Met 03/25/2024  LONG TERM GOALS: Target date: 05/06/2024  Improve patient specific functional score to at least 6 Baseline: 1.25 Goal status: Ongoing 04/15/2024  2.  Kent Johnson will be able to maintain left shoulder pain at a level of 2/10 or better on the visual analog scale Baseline: 2/10 72 hours post-surgery Goal status: Ongoing 04/22/2024  3.  Improve left shoulder active range of motion to 90% or better of the uninvolved right Baseline: See objective Goal status: Met 04/22/2024  4.  Improve left shoulder strength for internal/external rotation to 60% of the uninvolved right at 12 weeks postsurgery (9 months to a year expected for 90% plus strength after the  revision) Baseline: Deferred secondary to surgery 72 hours ago Goal status: On Going 04/22/2024  5.  Kent Johnson will be independent with his long-term maintenance home exercise program at discharge Baseline: Started 02/12/2024 Goal status: INITIAL   PLAN:  PT FREQUENCY: 2x/week  PT DURATION: 12 weeks  PLANNED INTERVENTIONS: 97750- Physical Performance Testing, 97110-Therapeutic exercises, 97530- Therapeutic activity, 97112- Neuromuscular re-education, 97535- Self Care, [id number]- Manual therapy, [id number]- Electrical stimulation (unattended), 97016- Vasopneumatic device, Patient/Family education, Joint mobilization, and Cryotherapy  PLAN FOR NEXT SESSION: Okay to appropriately progress isolated rotator cuff strengthening as long as no pain or signs of overuse.    02/26/24- per MD note from 02/24/24 (updated surgical precautions)  2 weeks status post left shoulder subscapularis repair open overall doing very well. At today's visit I did describe that I would recommend allowed active forward elevation in the scapular plane as well as external rotation. I did describe that mildly restriction would be no internal rotation against weight or no going behind the back. Other than that he may begin active forward elevation as well as external rotation     Myer LELON Ivory PT, MPT 04/22/24 2:07 PM

## 2024-04-26 ENCOUNTER — Encounter: Admitting: Rehabilitative and Restorative Service Providers"

## 2024-04-26 ENCOUNTER — Encounter: Admitting: Physical Therapy

## 2024-04-29 ENCOUNTER — Ambulatory Visit: Admitting: Rehabilitative and Restorative Service Providers"

## 2024-04-29 ENCOUNTER — Encounter: Payer: Self-pay | Admitting: Rehabilitative and Restorative Service Providers"

## 2024-04-29 DIAGNOSIS — M25512 Pain in left shoulder: Secondary | ICD-10-CM | POA: Diagnosis not present

## 2024-04-29 DIAGNOSIS — M25612 Stiffness of left shoulder, not elsewhere classified: Secondary | ICD-10-CM

## 2024-04-29 DIAGNOSIS — R600 Localized edema: Secondary | ICD-10-CM

## 2024-04-29 DIAGNOSIS — M6281 Muscle weakness (generalized): Secondary | ICD-10-CM

## 2024-04-29 NOTE — Therapy (Signed)
 OUTPATIENT PHYSICAL THERAPY TREATMENT/PROGRESS NOTE   Progress Note Reporting Period 02/12/2024 to 04/29/2024  See note below for Objective Data and Assessment of Progress/Goals.     Patient Name: Kent Johnson MRN: [id number] DOB:March 21, 1986, 38 y.o., male Today's Date: 04/29/2024  END OF SESSION:  PT End of Session - 04/29/24 0845     Visit Number 15    Number of Visits 24    Date for Recertification  05/06/24    Authorization Type [employer] (no auth needed)    Authorization - Number of Visits 24    PT Start Time 0845    PT Stop Time 0934    PT Time Calculation (min) 49 min    Activity Tolerance Patient tolerated treatment well;No increased pain    Behavior During Therapy [facility] for tasks assessed/performed          Past Medical History:  Diagnosis Date   [place]   [place]    Arthritis 2021   rt knee   Routine general medical examination at a health care facility 04/08/2023   Past Surgical History:  Procedure Laterality Date   BICEPT TENODESIS Left 02/09/2024   Procedure: RIGHT PECTORAL BICEPS TENODESIS / POSSIBLE DERMAL AUGMENTATION;  Surgeon: Genelle Standing, MD;  Location: [facility];  Service: Orthopedics;  Laterality: Left;   CLAVICLE SURGERY  2011   broke collar bone skiing   COLON SURGERY  around 2007   benign lipoma inside of colon lead to intusseption   LIPOMA EXCISION  2007   benign lipoma in colon, caused intersepcion   OPEN SUBSCAPULARIS REPAIR Left 02/09/2024   Procedure: LEFT SHOULDER OPEN SUBSCAPULARIS REPAIR;  Surgeon: Genelle Standing, MD;  Location: [facility];  Service: Orthopedics;  Laterality: Left;   SHOULDER ARTHROSCOPY Left 02/09/2024   Procedure: ARTHROSCOPY, SHOULDER;  Surgeon: Genelle Standing, MD;  Location: [facility];  Service: Orthopedics;  Laterality: Left;   SHOULDER ARTHROSCOPY WITH DEBRIDEMENT AND BICEP TENDON REPAIR Left 03/11/2022   Procedure: LEFT SHOULDER  ARTHROSCOPY WITH MINI OPEN SUBSCAPULARIS REPAIR  AND BICEP TENDODESIS;  Surgeon: Genelle Standing, MD;  Location: [facility];  Service: Orthopedics;  Laterality: Left;   WOUND EXPLORATION N/A 05/23/2022   Procedure: WOUND EXPLORATION WITH REMOVAL OF FOREIGN BODY OF ABDOMINAL WALL;  Surgeon: Eletha Boas, MD;  Location: [facility];  Service: General;  Laterality: N/A;   Patient Active Problem List   Diagnosis Date Noted   Routine general medical examination at a health care facility 04/08/2023   Foreign body in anterior abdominal wall 05/18/2022   Avulsion of left subscapularis    Anxiety disorder 11/11/2021   Insomnia 11/11/2021    PCP: [facility]  REFERRING PROVIDER: Standing Genelle, MD  REFERRING DIAG: S46.892A (ICD-10-CM) - Avulsion of left subscapularis, initial encounter  THERAPY DIAG:  Muscle weakness (generalized)  Stiffness of left shoulder, not elsewhere classified  Acute pain of left shoulder  Localized edema  Rationale for Evaluation and Treatment: Rehabilitation  ONSET DATE: 02/09/2024  SUBJECTIVE:  SUBJECTIVE STATEMENT: [name] continues his pattern of decreased pain and increased ease with the left upper extremity use.  HEP compliance remains good.  Hand dominance: Left  PERTINENT HISTORY: Left biceps tenodesis, left subscapularis repair 2023, left clavicle fracture 2011  PAIN:  NPRS scale: 0-1/10 this week Pain location: Lt pectoralis and anterior shoulder Pain description: Slight soreness/ache  Aggravating factors: Overuse Relieving factors: Rest and exercise  PRECAUTIONS: Shoulder-revision.  This is [name]'s second subscapularis repair on the left side so we will need to be conservative with starting strengthening given his revision status.  Recommend  holding off until the end of week 10 post-surgery before beginning any resisted strength work.  RED FLAGS: None   WEIGHT BEARING RESTRICTIONS: Yes No left upper extremity weight-bearing  FALLS:  Has patient fallen in last 6 months? No  LIVING ENVIRONMENT: Lives with: lives with their family, lives with their spouse, and with 3 children Lives in: House/apartment Stairs: No problems Has following equipment at home: Lt shoulder brace  OCCUPATION: Sport and exercise psychologist, power infrastructure  PLOF: Independent  PATIENT GOALS:Return to swimming, yoga and ADLs without restrictions  NEXT MD VISIT: May 04, 2024  OBJECTIVE:   Note: Objective measures were completed at Evaluation unless otherwise noted.  DIAGNOSTIC FINDINGS:  1. Prior rotator cuff repair. Moderate supraspinatus tendinosis. 2. High-grade partial, near complete, tear of the subscapularis tendon. 3. No labral tear.  PATIENT SURVEYS:  PSFS: THE PATIENT SPECIFIC FUNCTIONAL SCALE  Place score of 0-10 (0 = unable to perform activity and 10 = able to perform activity at the same level as before injury or problem)  Activity Date: 02/12/2024 03/25/2024 04/15/2024 04/29/2024  Swimming 1 5 5 6   2.  Lifting the arm straight out 1 2 6 9   3.  Shoulder press at the gym 0 0 2 5  4.  Yoga 3 3 3 6   Total Score 1.25 2.5 4 6.5    Total Score = Sum of activity scores/number of activities  Minimally Detectable Change: 3 points (for single activity); 2 points (for average score)   COGNITION: 02/12/2024 Overall cognitive status: Within functional limits for tasks assessed     SENSATION: 02/12/2024 [facility]  POSTURE: 02/12/2024 No significant postural issues noted  UPPER EXTREMITY ROM:   ROM Left/Right 02/12/2024 PROM Left 03/04/2024 PROM Left 03/11/2024 In supine Left  03/18/2024 Left  03/25/2024 Left/Right 04/15/2024 Left 04/22/2024 Left 04/29/2024  Shoulder flexion 105/175 130 140 AAROM 150 150 150/170 160 170  Shoulder extension           Shoulder abduction   112 AAROM in supine       Shoulder horizontal adduction 0/35   30 35 45/60 50 50  Shoulder internal rotation 50/70 40  40 60 45/80 50 55  Shoulder external rotation 30/105 45 52 AAROM in supine 45 deg abduction.  60 60 80/105 90 90  (Blank rows = not tested)  UPPER EXTREMITY STRENGTH: Deferred due to being 72 hours post-surgery  In pounds with hand-held dynamometer 04/22/2024 04/29/2024  Shoulder flexion    Shoulder extension    Shoulder abduction    Shoulder adduction    Shoulder internal rotation 17.3/43.1 24.8/42.2  Shoulder external rotation 19.1/27.2 23.8/31.3  Middle trapezius    Lower trapezius    Elbow flexion    Elbow extension    Wrist flexion    Wrist extension    Wrist ulnar deviation    Wrist radial deviation    Wrist pronation    Wrist supination  Grip strength (lbs)    (Blank rows = not tested)                                                                                                                 TREATMENT 04/29/2024 UBE Push/Pull/Rest 5 minutes with 20 second intervals of pushing pulling and resting, RPM Goal 60, level 5 resistance Supine IR/ER stretch at 70 degrees abduction 10 x 10 seconds each Posterior capsule stretch 5 x 10 seconds Thera-Band ER Green 10 3 seconds, slow eccentrics Thera-Band IR Green 10 3 seconds, slow eccentrics  Functional Activities: Supine arm raises/scapular protraction 30 x 3 seconds with 10# (reaching and overhead function) Supine shoulder flexion AROM (protract 1st, palm in) 10 x 10 seconds (overhead function) Thumb up the back stretch (reaching behind) 10 x 10 seconds  02464: Reviewed and updated HEP; answered questions with week 12 and long-term expectations; discussed appropriate gym activities   04/22/2024 Supine IR/ER stretch at 70 degrees abduction 10 x 10 seconds each Posterior capsule stretch 10 x 10 seconds Thera-Band ER Green 2 sets of 10 3 seconds, slow  eccentrics Thera-Band IR Green 2 sets of 10 3 seconds, slow eccentrics  Functional Activities: Supine arm raises/scapular protraction 30 x 3 seconds with 7# (reaching and overhead function) Supine shoulder flexion AROM (protract 1st, palm in) 10 x 10 seconds (overhead function) Thumb up the back stretch (reaching behind) 10 x 10 seconds  02464: Reviewed and updated HEP; answered questions with week 12 and long-term expectations; discussed appropriate gym activities   04/15/2024 Shoulder blade pinches/scapular protraction 10 x 5 seconds Supine IR/ER stretch at 70 degrees abduction 10 x 10 seconds each Side-lie ER 2 sets of 10 with 3# Posterior capsule stretch 10 x 10 seconds  Functional Activities: Supine arm raises/scapular protraction 30 x 3 seconds with 5# (reaching and overhead function) Pull to chest/Rows Blue Thera-Band 20 x (posture and scapulo-humeral proprioception) Supine shoulder flexion AROM (protract 1st, palm in) 10 x 10 seconds (overhead function)  PATIENT EDUCATION: Education details: See above Person educated: Patient Education method: Explanation, Demonstration, Tactile cues, Verbal cues, and Handouts Education comprehension: verbalized understanding, returned demonstration, verbal cues required, tactile cues required, and needs further education  HOME EXERCISE PROGRAM: Access Code: [email or web address] URL: [email or web address] Date: 04/22/2024 Prepared by: Lamar Ivory  Exercises - Standing Scapular Retraction  - 5 x daily - 7 x weekly - 1 sets - 5 reps - 5 second hold - Supine Scapular Protraction in Flexion with Dumbbells  - 1 x daily - 7 x weekly - 2 sets - 30 reps - 3 seconds hold - Supine Shoulder Internal Rotation Stretch  - 1-2 x daily - 7 x weekly - 1 sets - 10-20 reps - 10 seconds hold - Supine Shoulder External Rotation Stretch  - 1-2 x daily - 7 x weekly - 1 sets - 10 reps - 10 seconds hold - Supine Shoulder Flexion Extension Full Range AROM  -  1 x daily - 7 x weekly - 1 sets - 10  reps - 10 seconds hold - Shoulder External Rotation with Anchored Resistance  - 1 x daily - 7 x weekly - 2 sets - 10 reps - 3 hold - Shoulder Internal Rotation with Resistance  - 1 x daily - 7 x weekly - 2 sets - 10 reps - 3 hold - Standing Shoulder Internal Rotation Stretch with Hands Behind Back  - 1 x daily - 7 x weekly - 1 sets - 10 reps - 10 seconds hold  ASSESSMENT:  CLINICAL IMPRESSION: Left shoulder AROM is now 96% of the long-term goal (Goal is 90% at 12 weeks, currently 11.5 weeks post-surgery).  Strength is 66% (Goal 60% at the end of week 12 post-surgery).  [name] has been encouraged to gradually return to yoga and to check with Dr Genelle to see if he agrees return to the pool is appropriate.  [name] and I had a discussion about gradual return to activities, with days off in between to rest and being very close attention to symptoms as an increase in symptoms would likely be more of a tendonitis problem versus a surgery problem, but certainly important to pay attention to.  [name] should be ready for transfer into independent rehabilitation next week after his follow-up with Dr. Genelle.  OBJECTIVE IMPAIRMENTS: decreased activity tolerance, decreased endurance, decreased knowledge of condition, decreased ROM, decreased strength, decreased safety awareness, increased edema, impaired perceived functional ability, impaired UE functional use, and pain.   ACTIVITY LIMITATIONS: carrying, lifting, dressing, and reach over head  PARTICIPATION LIMITATIONS: cleaning, driving, community activity, occupation, and yard work  PERSONAL FACTORS: Left biceps tenodesis, left subscapularis repair 2023, left clavicle fracture 2011 are also affecting patient's functional outcome.   REHAB POTENTIAL: Good  CLINICAL DECISION MAKING: Evolving/moderate complexity  EVALUATION COMPLEXITY: Moderate   GOALS: Goals reviewed with patient? Yes  SHORT TERM GOALS: Target date:  03/25/2024  [name] will be independent with his day 1 home exercise program Baseline: Started 02/12/2024 Goal status: Met 03/04/2024  2.  Improve left shoulder passive range of motion for flexion to 135 degrees; internal rotation to 60 degrees; external rotation to 30 degrees and horizontal adduction to 30 degrees Baseline: 105; 50; 30 and 0 respectively Goal status: Met 03/25/2024  LONG TERM GOALS: Target date: 05/06/2024  Improve patient specific functional score to at least 6 Baseline: 1.25 Goal status: Met 04/29/2024  2.  [name] will be able to maintain left shoulder pain at a level of 2/10 or better on the visual analog scale Baseline: 2/10 72 hours post-surgery Goal status: O 04/29/2024  3.  Improve left shoulder active range of motion to 90% or better of the uninvolved right Baseline: See objective Goal status: Met 04/22/2024  4.  Improve left shoulder strength for internal/external rotation to 60% of the uninvolved right at 12 weeks postsurgery (9 months to a year expected for 90% plus strength after the revision) Baseline: Deferred secondary to surgery 72 hours ago Goal status: Met 04/29/2024  5.  [name] will be independent with his long-term maintenance home exercise program at discharge Baseline: Started 02/12/2024 Goal status: Partially met 04/29/2024   PLAN:  PT FREQUENCY: 1-2 additional visits to make sure [name] is independent with his long-term maintenance home exercise program  PT DURATION: 1-2 visits  PLANNED INTERVENTIONS: 97750- Physical Performance Testing, 97110-Therapeutic exercises, 97530- Therapeutic activity, 97112- Neuromuscular re-education, 97535- Self Care, [id number]- Manual therapy, [id number]- Electrical stimulation (unattended), 97016- Vasopneumatic device, Patient/Family education, Joint mobilization, and Cryotherapy  PLAN FOR NEXT SESSION: Answer questions about  current home exercises and return to activity.     Myer LELON Ivory PT, MPT 04/29/24 2:13  PM

## 2024-05-03 ENCOUNTER — Encounter

## 2024-05-03 ENCOUNTER — Encounter: Payer: Self-pay | Admitting: Physical Therapy

## 2024-05-03 ENCOUNTER — Ambulatory Visit: Admitting: Physical Therapy

## 2024-05-03 DIAGNOSIS — M25612 Stiffness of left shoulder, not elsewhere classified: Secondary | ICD-10-CM

## 2024-05-03 DIAGNOSIS — M6281 Muscle weakness (generalized): Secondary | ICD-10-CM | POA: Diagnosis not present

## 2024-05-03 DIAGNOSIS — M25512 Pain in left shoulder: Secondary | ICD-10-CM | POA: Diagnosis not present

## 2024-05-03 DIAGNOSIS — R600 Localized edema: Secondary | ICD-10-CM | POA: Diagnosis not present

## 2024-05-03 NOTE — Therapy (Addendum)
 OUTPATIENT PHYSICAL THERAPY TREATMENT  PHYSICAL THERAPY DISCHARGE SUMMARY  Visits from Start of Care: 16  Current functional level related to goals / functional outcomes: See note   Remaining deficits: See note   Education / Equipment: HEP   Patient agrees to discharge. Patient goals were mostly met. Patient is being discharged due to not returning since the last visit.    Patient Name: Kent Johnson MRN: [id number] DOB:1985-07-27, 38 y.o., male Today's Date: 06/14/2024  END OF SESSION:     Past Medical History:  Diagnosis Date   [place]   [place]    Arthritis 2021   rt knee   Routine general medical examination at a health care facility 04/08/2023   Past Surgical History:  Procedure Laterality Date   BICEPT TENODESIS Left 02/09/2024   Procedure: RIGHT PECTORAL BICEPS TENODESIS / POSSIBLE DERMAL AUGMENTATION;  Surgeon: Genelle Standing, MD;  Location: [facility];  Service: Orthopedics;  Laterality: Left;   CLAVICLE SURGERY  2011   broke collar bone skiing   COLON SURGERY  around 2007   benign lipoma inside of colon lead to intusseption   LIPOMA EXCISION  2007   benign lipoma in colon, caused intersepcion   OPEN SUBSCAPULARIS REPAIR Left 02/09/2024   Procedure: LEFT SHOULDER OPEN SUBSCAPULARIS REPAIR;  Surgeon: Genelle Standing, MD;  Location: [facility];  Service: Orthopedics;  Laterality: Left;   SHOULDER ARTHROSCOPY Left 02/09/2024   Procedure: ARTHROSCOPY, SHOULDER;  Surgeon: Genelle Standing, MD;  Location: [facility];  Service: Orthopedics;  Laterality: Left;   SHOULDER ARTHROSCOPY WITH DEBRIDEMENT AND BICEP TENDON REPAIR Left 03/11/2022   Procedure: LEFT SHOULDER ARTHROSCOPY WITH MINI OPEN SUBSCAPULARIS REPAIR  AND BICEP TENDODESIS;  Surgeon: Genelle Standing, MD;  Location: [facility];  Service: Orthopedics;  Laterality: Left;   WOUND EXPLORATION N/A 05/23/2022   Procedure:  WOUND EXPLORATION WITH REMOVAL OF FOREIGN BODY OF ABDOMINAL WALL;  Surgeon: Eletha Boas, MD;  Location: [facility];  Service: General;  Laterality: N/A;   Patient Active Problem List   Diagnosis Date Noted   Routine general medical examination at a health care facility 04/08/2023   Foreign body in anterior abdominal wall 05/18/2022   Avulsion of left subscapularis    Anxiety disorder 11/11/2021   Insomnia 11/11/2021    PCP: [facility]  REFERRING PROVIDER: Standing Genelle, MD  REFERRING DIAG: S46.892A (ICD-10-CM) - Avulsion of left subscapularis, initial encounter  THERAPY DIAG:  Muscle weakness (generalized)  Stiffness of left shoulder, not elsewhere classified  Acute pain of left shoulder  Localized edema  Rationale for Evaluation and Treatment: Rehabilitation  ONSET DATE: 02/09/2024  SUBJECTIVE:  SUBJECTIVE STATEMENT: Shoulder is feeling a lot better; feels like he's turned a corner these past two weeks.    Hand dominance: Left  PERTINENT HISTORY: Left biceps tenodesis, left subscapularis repair 2023, left clavicle fracture 2011  PAIN:  NPRS scale: 0-1/10 this week Pain location: Lt pectoralis and anterior shoulder Pain description: Slight soreness/ache  Aggravating factors: Overuse Relieving factors: Rest and exercise  PRECAUTIONS: Shoulder-revision.  This is [name]'s second subscapularis repair on the left side so we will need to be conservative with starting strengthening given his revision status.  Recommend holding off until the end of week 10 post-surgery before beginning any resisted strength work.  RED FLAGS: None   WEIGHT BEARING RESTRICTIONS: Yes No left upper extremity weight-bearing  FALLS:  Has patient fallen in last 6 months? No  LIVING ENVIRONMENT: Lives  with: lives with their family, lives with their spouse, and with 3 children Lives in: House/apartment Stairs: No problems Has following equipment at home: Lt shoulder brace  OCCUPATION: Sport and exercise psychologist, power infrastructure  PLOF: Independent  PATIENT GOALS:Return to swimming, yoga and ADLs without restrictions  NEXT MD VISIT: May 04, 2024  OBJECTIVE:   Note: Objective measures were completed at Evaluation unless otherwise noted.  DIAGNOSTIC FINDINGS:  1. Prior rotator cuff repair. Moderate supraspinatus tendinosis. 2. High-grade partial, near complete, tear of the subscapularis tendon. 3. No labral tear.  PATIENT SURVEYS:  PSFS: THE PATIENT SPECIFIC FUNCTIONAL SCALE  Place score of 0-10 (0 = unable to perform activity and 10 = able to perform activity at the same level as before injury or problem)  Activity Date: 02/12/2024 03/25/2024 04/15/2024 04/29/2024  Swimming 1 5 5 6   2.  Lifting the arm straight out 1 2 6 9   3.  Shoulder press at the gym 0 0 2 5  4.  Yoga 3 3 3 6   Total Score 1.25 2.5 4 6.5    Total Score = Sum of activity scores/number of activities  Minimally Detectable Change: 3 points (for single activity); 2 points (for average score)   COGNITION: 02/12/2024 Overall cognitive status: Within functional limits for tasks assessed     SENSATION: 02/12/2024 [facility]  POSTURE: 02/12/2024 No significant postural issues noted  UPPER EXTREMITY ROM:   ROM Left/Right 02/12/2024 PROM Left 03/04/2024 PROM Left 03/11/2024 In supine Left  03/18/2024 Left  03/25/2024 Left/Right 04/15/2024 Left 04/22/2024 Left 04/29/2024  Shoulder flexion 105/175 130 140 AAROM 150 150 150/170 160 170  Shoulder extension          Shoulder abduction   112 AAROM in supine       Shoulder horizontal adduction 0/35   30 35 45/60 50 50  Shoulder internal rotation 50/70 40  40 60 45/80 50 55  Shoulder external rotation 30/105 45 52 AAROM in supine 45 deg abduction.  60 60 80/105 90 90  (Blank  rows = not tested)  UPPER EXTREMITY STRENGTH: Deferred due to being 72 hours post-surgery  In pounds with hand-held dynamometer 04/22/2024 04/29/2024  Shoulder flexion    Shoulder extension    Shoulder abduction    Shoulder adduction    Shoulder internal rotation 17.3/43.1 24.8/42.2  Shoulder external rotation 19.1/27.2 23.8/31.3  (Blank rows = not tested)  TREATMENT 05/03/24 TherAct UBE L6.5 x 6 min (3 min each direction) Chest press 2x10; 35#; cues for ROM Lat pull downs 2x10; 45# Cable diagonals 5# on Lt 2x10 Cable Lt horizontal abduction 5# 2x10 TRX rows 2x10 TRX fly 2x10  Overhead press 5# 2x10 bil    04/29/2024 UBE Push/Pull/Rest 5 minutes with 20 second intervals of pushing pulling and resting, RPM Goal 60, level 5 resistance Supine IR/ER stretch at 70 degrees abduction 10 x 10 seconds each Posterior capsule stretch 5 x 10 seconds Thera-Band ER Green 10 3 seconds, slow eccentrics Thera-Band IR Green 10 3 seconds, slow eccentrics  Functional Activities: Supine arm raises/scapular protraction 30 x 3 seconds with 10# (reaching and overhead function) Supine shoulder flexion AROM (protract 1st, palm in) 10 x 10 seconds (overhead function) Thumb up the back stretch (reaching behind) 10 x 10 seconds  02464: Reviewed and updated HEP; answered questions with week 12 and long-term expectations; discussed appropriate gym activities   04/22/2024 Supine IR/ER stretch at 70 degrees abduction 10 x 10 seconds each Posterior capsule stretch 10 x 10 seconds Thera-Band ER Green 2 sets of 10 3 seconds, slow eccentrics Thera-Band IR Green 2 sets of 10 3 seconds, slow eccentrics  Functional Activities: Supine arm raises/scapular protraction 30 x 3 seconds with 7# (reaching and overhead function) Supine shoulder flexion AROM (protract 1st, palm in) 10 x 10 seconds  (overhead function) Thumb up the back stretch (reaching behind) 10 x 10 seconds  02464: Reviewed and updated HEP; answered questions with week 12 and long-term expectations; discussed appropriate gym activities   04/15/2024 Shoulder blade pinches/scapular protraction 10 x 5 seconds Supine IR/ER stretch at 70 degrees abduction 10 x 10 seconds each Side-lie ER 2 sets of 10 with 3# Posterior capsule stretch 10 x 10 seconds  Functional Activities: Supine arm raises/scapular protraction 30 x 3 seconds with 5# (reaching and overhead function) Pull to chest/Rows Blue Thera-Band 20 x (posture and scapulo-humeral proprioception) Supine shoulder flexion AROM (protract 1st, palm in) 10 x 10 seconds (overhead function)  PATIENT EDUCATION: Education details: See above Person educated: Patient Education method: Explanation, Demonstration, Tactile cues, Verbal cues, and Handouts Education comprehension: verbalized understanding, returned demonstration, verbal cues required, tactile cues required, and needs further education  HOME EXERCISE PROGRAM: Access Code: [email or web address] URL: [email or web address] Date: 05/03/2024 Prepared by: Corean Ku  Exercises - Standing Scapular Retraction  - 5 x daily - 7 x weekly - 1 sets - 5 reps - 5 second hold - Supine Scapular Protraction in Flexion with Dumbbells  - 1 x daily - 7 x weekly - 2 sets - 30 reps - 3 seconds hold - Supine Shoulder Internal Rotation Stretch  - 1-2 x daily - 7 x weekly - 1 sets - 10-20 reps - 10 seconds hold - Supine Shoulder External Rotation Stretch  - 1-2 x daily - 7 x weekly - 1 sets - 10 reps - 10 seconds hold - Supine Shoulder Flexion Extension Full Range AROM  - 1 x daily - 7 x weekly - 1 sets - 10 reps - 10 seconds hold - Shoulder External Rotation with Anchored Resistance  - 1 x daily - 7 x weekly - 2 sets - 10 reps - 3 hold - Shoulder Internal Rotation with Resistance  - 1 x daily - 7 x weekly - 2 sets - 10  reps - 3 hold - Standing Shoulder Internal Rotation Stretch with Hands Behind Back  - 1 x daily - 7 x weekly -  1 sets - 10 reps - 10 seconds hold - Low Row with TRX  - 1 x daily - 7 x weekly - 3 sets - 10 reps - T Deltoid Fly with TRX  - 1 x daily - 7 x weekly - 3 sets - 10 reps - Standing Cable Lifts  - 1 x daily - 7 x weekly - 3 sets - 10 reps - Shoulder Overhead Press in Flexion with Dumbbells  - 1 x daily - 7 x weekly - 3 sets - 10 reps  ASSESSMENT:  CLINICAL IMPRESSION: Added additional strengthening exercises that pt can perform at gym in preparation for transition to community fitness.  Anticipate holding PT next visit.    OBJECTIVE IMPAIRMENTS: decreased activity tolerance, decreased endurance, decreased knowledge of condition, decreased ROM, decreased strength, decreased safety awareness, increased edema, impaired perceived functional ability, impaired UE functional use, and pain.   ACTIVITY LIMITATIONS: carrying, lifting, dressing, and reach over head  PARTICIPATION LIMITATIONS: cleaning, driving, community activity, occupation, and yard work  PERSONAL FACTORS: Left biceps tenodesis, left subscapularis repair 2023, left clavicle fracture 2011 are also affecting patient's functional outcome.   REHAB POTENTIAL: Good  CLINICAL DECISION MAKING: Evolving/moderate complexity  EVALUATION COMPLEXITY: Moderate   GOALS: Goals reviewed with patient? Yes  SHORT TERM GOALS: Target date: 03/25/2024  [name] will be independent with his day 1 home exercise program Baseline: Started 02/12/2024 Goal status: Met 03/04/2024  2.  Improve left shoulder passive range of motion for flexion to 135 degrees; internal rotation to 60 degrees; external rotation to 30 degrees and horizontal adduction to 30 degrees Baseline: 105; 50; 30 and 0 respectively Goal status: Met 03/25/2024  LONG TERM GOALS: Target date: 05/06/2024  Improve patient specific functional score to at least 6 Baseline:  1.25 Goal status: Met 04/29/2024  2.  [name] will be able to maintain left shoulder pain at a level of 2/10 or better on the visual analog scale Baseline: 2/10 72 hours post-surgery Goal status: O 04/29/2024  3.  Improve left shoulder active range of motion to 90% or better of the uninvolved right Baseline: See objective Goal status: Met 04/22/2024  4.  Improve left shoulder strength for internal/external rotation to 60% of the uninvolved right at 12 weeks postsurgery (9 months to a year expected for 90% plus strength after the revision) Baseline: Deferred secondary to surgery 72 hours ago Goal status: Met 04/29/2024  5.  [name] will be independent with his long-term maintenance home exercise program at discharge Baseline: Started 02/12/2024 Goal status: Partially met 04/29/2024   PLAN:  PT FREQUENCY: 1-2 additional visits to make sure [name] is independent with his long-term maintenance home exercise program  PT DURATION: 1-2 visits  PLANNED INTERVENTIONS: 97750- Physical Performance Testing, 97110-Therapeutic exercises, 97530- Therapeutic activity, 97112- Neuromuscular re-education, 97535- Self Care, 02859- Manual therapy, [id number]- Electrical stimulation (unattended), 97016- Vasopneumatic device, Patient/Family education, Joint mobilization, and Cryotherapy  PLAN FOR NEXT SESSION: check remaining goals and plan to hold     Corean JULIANNA Ku, PT, DPT 06/14/24 1:48 PM  Addendum Myer LELON Ivory PT, MPT

## 2024-05-04 ENCOUNTER — Ambulatory Visit (INDEPENDENT_AMBULATORY_CARE_PROVIDER_SITE_OTHER): Admitting: Orthopaedic Surgery

## 2024-05-04 DIAGNOSIS — S46892A Other injury of other muscles, fascia and tendons at shoulder and upper arm level, left arm, initial encounter: Secondary | ICD-10-CM

## 2024-05-04 NOTE — Progress Notes (Signed)
 Post Operative Evaluation    Procedure/Date of Surgery: Left shoulder subscapularis repair 7/22  Interval History:    Presents today 12 weeks status post above procedure.  He has started strengthening which is coming along nicely.  Overall overhead range of motion is feeling much more solid   PMH/PSH/Family History/Social History/Meds/Allergies:    Past Medical History:  Diagnosis Date   [place]   [place]    Arthritis 2021   rt knee   Routine general medical examination at a health care facility 04/08/2023   Past Surgical History:  Procedure Laterality Date   BICEPT TENODESIS Left 02/09/2024   Procedure: RIGHT PECTORAL BICEPS TENODESIS / POSSIBLE DERMAL AUGMENTATION;  Surgeon: Kent Standing, MD;  Location: [facility];  Service: Orthopedics;  Laterality: Left;   CLAVICLE SURGERY  2011   broke collar bone skiing   COLON SURGERY  around 2007   benign lipoma inside of colon lead to intusseption   LIPOMA EXCISION  2007   benign lipoma in colon, caused intersepcion   OPEN SUBSCAPULARIS REPAIR Left 02/09/2024   Procedure: LEFT SHOULDER OPEN SUBSCAPULARIS REPAIR;  Surgeon: Kent Standing, MD;  Location: [facility];  Service: Orthopedics;  Laterality: Left;   SHOULDER ARTHROSCOPY Left 02/09/2024   Procedure: ARTHROSCOPY, SHOULDER;  Surgeon: Kent Standing, MD;  Location: [facility];  Service: Orthopedics;  Laterality: Left;   SHOULDER ARTHROSCOPY WITH DEBRIDEMENT AND BICEP TENDON REPAIR Left 03/11/2022   Procedure: LEFT SHOULDER ARTHROSCOPY WITH MINI OPEN SUBSCAPULARIS REPAIR  AND BICEP TENDODESIS;  Surgeon: Kent Standing, MD;  Location: [facility];  Service: Orthopedics;  Laterality: Left;   WOUND EXPLORATION N/A 05/23/2022   Procedure: WOUND EXPLORATION WITH REMOVAL OF FOREIGN BODY OF ABDOMINAL WALL;  Surgeon: Kent Boas, MD;  Location: [facility];  Service: General;   Laterality: N/A;   Social History   Socioeconomic History   Marital status: Married    Spouse name: Kent Johnson   Number of children: Not on file   Years of education: Not on file   Highest education level: Master's degree (e.g., MA, MS, MEng, MEd, MSW, MBA)  Occupational History   Not on file  Tobacco Use   Smoking status: Former    Types: Cigarettes   Smokeless tobacco: Never   Tobacco comments:    2 cig per year.  Vaping Use   Vaping status: Not on file  Substance and Sexual Activity   Alcohol use: Yes    Comment: occas   Drug use: Never   Sexual activity: Yes    Partners: Female  Other Topics Concern   Not on file  Social History Narrative   Not on file   Social Drivers of Health   Financial Resource Strain: Low Risk  (08/30/2023)   Overall Financial Resource Strain (CARDIA)    Difficulty of Paying Living Expenses: Not hard at all  Food Insecurity: No Food Insecurity (08/30/2023)   Hunger Vital Sign    Worried About Running Out of Food in the Last Year: Never true    Ran Out of Food in the Last Year: Never true  Transportation Needs: No Transportation Needs (08/30/2023)   PRAPARE - Administrator, Civil Service (Medical): No    Lack of Transportation (Non-Medical): No  Physical Activity:  Sufficiently Active (08/30/2023)   Exercise Vital Sign    Days of Exercise per Week: 4 days    Minutes of Exercise per Session: 50 min  Stress: No Stress Concern Present (08/30/2023)   [employer] of Occupational Health - Occupational Stress Questionnaire    Feeling of Stress : Only a little  Social Connections: Socially Integrated (08/30/2023)   Social Connection and Isolation Panel    Frequency of Communication with Friends and Family: Three times a week    Frequency of Social Gatherings with Friends and Family: Once a week    Attends Religious Services: 1 to 4 times per year    Active Member of [employer] or Organizations: Yes    Attends Engineer, structural: More  than 4 times per year    Marital Status: Married   Family History  Problem Relation Age of Onset   Anxiety disorder Mother    Cancer Mother        melanoma   Miscarriages / Stillbirths Mother    Asthma Brother    Arthritis Maternal Grandmother    Cancer Maternal Grandfather    Heart attack Maternal Grandfather    No Known Allergies Current Outpatient Medications  Medication Sig Dispense Refill   ascorbic acid (VITAMIN C) 500 MG tablet Take 500 mg by mouth daily.     aspirin  EC 325 MG tablet Take 1 tablet (325 mg total) by mouth daily. 14 tablet 0   Cholecalciferol (VITAMIN D3) 125 MCG (5000 UT) TABS Take 5,000 Units by mouth daily.     cyanocobalamin (VITAMIN B12) 1000 MCG tablet Take 1,000 mcg by mouth daily.     fexofenadine (ALLEGRA) 180 MG tablet Take 180 mg by mouth daily as needed for allergies.     oxyCODONE  (ROXICODONE ) 5 MG immediate release tablet Take 1 tablet (5 mg total) by mouth every 4 (four) hours as needed for severe pain (pain score 7-10) or breakthrough pain. 10 tablet 0   No current facility-administered medications for this visit.   No results found.  Review of Systems:   A ROS was performed including pertinent positives and negatives as documented in the HPI.   Musculoskeletal Exam:    There were no vitals taken for this visit.  Left shoulder with well-appearing incision.  In the Johnson position he can forward elevate to approximately 50 degrees with external rotation at the side to 30 degrees.  Internal rotation deferred today.  Distal neurosensory exam is intact  Imaging:      I personally reviewed and interpreted the radiographs.   Assessment:   12 weeks status post left shoulder subscapularis repair open overall doing very well.  At this time he will continue to work through strengthening and I will plan to see him back in months for reassessment Plan :    - Return to clinic 8 weeks for reassessment       I personally saw and  evaluated the patient, and participated in the management and treatment plan.  Kent Parker, MD Attending Physician, Orthopedic Surgery  This document was dictated using Dragon voice recognition software. A reasonable attempt at proof reading has been made to minimize errors.

## 2024-05-05 ENCOUNTER — Encounter: Admitting: Rehabilitative and Restorative Service Providers"

## 2024-05-23 ENCOUNTER — Encounter: Payer: Self-pay | Admitting: Radiology

## 2024-07-06 ENCOUNTER — Ambulatory Visit (INDEPENDENT_AMBULATORY_CARE_PROVIDER_SITE_OTHER): Admitting: Orthopaedic Surgery

## 2024-07-06 DIAGNOSIS — S46892A Other injury of other muscles, fascia and tendons at shoulder and upper arm level, left arm, initial encounter: Secondary | ICD-10-CM

## 2024-07-06 NOTE — Progress Notes (Signed)
 Post Operative Evaluation    Procedure/Date of Surgery: Left shoulder subscapularis repair 7/22  Interval History:    Presents today status post above procedure.  Overall he is continuing to work through strengthening although the shoulder is feeling much improved  PMH/PSH/Family History/Social History/Meds/Allergies:    Past Medical History:  Diagnosis Date   Allergy 2017   moved [facility]   Anxiety    Arthritis 2021   rt knee   Routine general medical examination at a health care facility 04/08/2023   Past Surgical History:  Procedure Laterality Date   BICEPT TENODESIS Left 02/09/2024   Procedure: RIGHT PECTORAL BICEPS TENODESIS / POSSIBLE DERMAL AUGMENTATION;  Surgeon: Genelle Standing, MD;  Location: [facility];  Service: Orthopedics;  Laterality: Left;   CLAVICLE SURGERY  2011   broke collar bone skiing   COLON SURGERY  around 2007   benign lipoma inside of colon lead to intusseption   LIPOMA EXCISION  2007   benign lipoma in colon, caused intersepcion   OPEN SUBSCAPULARIS REPAIR Left 02/09/2024   Procedure: LEFT SHOULDER OPEN SUBSCAPULARIS REPAIR;  Surgeon: Genelle Standing, MD;  Location: [facility];  Service: Orthopedics;  Laterality: Left;   SHOULDER ARTHROSCOPY Left 02/09/2024   Procedure: ARTHROSCOPY, SHOULDER;  Surgeon: Genelle Standing, MD;  Location: [facility];  Service: Orthopedics;  Laterality: Left;   SHOULDER ARTHROSCOPY WITH DEBRIDEMENT AND BICEP TENDON REPAIR Left 03/11/2022   Procedure: LEFT SHOULDER ARTHROSCOPY WITH MINI OPEN SUBSCAPULARIS REPAIR  AND BICEP TENDODESIS;  Surgeon: Genelle Standing, MD;  Location: [facility];  Service: Orthopedics;  Laterality: Left;   WOUND EXPLORATION N/A 05/23/2022   Procedure: WOUND EXPLORATION WITH REMOVAL OF FOREIGN BODY OF ABDOMINAL WALL;  Surgeon: Eletha Boas, MD;  Location: [facility];  Service: General;  Laterality: N/A;   Social  History   Socioeconomic History   Marital status: Married    Spouse name: Vernell   Number of children: Not on file   Years of education: Not on file   Highest education level: Master's degree (e.g., MA, MS, MEng, MEd, MSW, MBA)  Occupational History   Not on file  Tobacco Use   Smoking status: Former    Types: Cigarettes   Smokeless tobacco: Never   Tobacco comments:    2 cig per year.  Vaping Use   Vaping status: Not on file  Substance and Sexual Activity   Alcohol use: Yes    Comment: occas   Drug use: Never   Sexual activity: Yes    Partners: Female  Other Topics Concern   Not on file  Social History Narrative   Not on file   Social Drivers of Health   Tobacco Use: Medium Risk (05/03/2024)   Patient History    Smoking Tobacco Use: Former    Smokeless Tobacco Use: Never    Passive Exposure: Not on file  Financial Resource Strain: Low Risk (08/30/2023)   Overall Financial Resource Strain (CARDIA)    Difficulty of Paying Living Expenses: Not hard at all  Food Insecurity: No Food Insecurity (08/30/2023)   Hunger Vital Sign    Worried About Running Out of Food in the Last Year: Never true    Ran Out of Food in the Last Year: Never true  Transportation Needs: No Transportation Needs (08/30/2023)  PRAPARE - Administrator, Civil Service (Medical): No    Lack of Transportation (Non-Medical): No  Physical Activity: Sufficiently Active (08/30/2023)   Exercise Vital Sign    Days of Exercise per Week: 4 days    Minutes of Exercise per Session: 50 min  Stress: No Stress Concern Present (08/30/2023)   [employer] of Occupational Health - Occupational Stress Questionnaire    Feeling of Stress : Only a little  Social Connections: Socially Integrated (08/30/2023)   Social Connection and Isolation Panel    Frequency of Communication with Friends and Family: Three times a week    Frequency of Social Gatherings with Friends and Family: Once a week    Attends Religious  Services: 1 to 4 times per year    Active Member of Clubs or Organizations: Yes    Attends Banker Meetings: More than 4 times per year    Marital Status: Married  Depression (PHQ2-9): Low Risk (04/08/2023)   Depression (PHQ2-9)    PHQ-2 Score: 0  Alcohol Screen: Low Risk (08/30/2023)   Alcohol Screen    Last Alcohol Screening Score (AUDIT): 2  Housing: Low Risk (08/30/2023)   Housing Stability Vital Sign    Unable to Pay for Housing in the Last Year: No    Number of Times Moved in the Last Year: 0    Homeless in the Last Year: No  Utilities: Not on file  Health Literacy: Not on file   Family History  Problem Relation Age of Onset   Anxiety disorder Mother    Cancer Mother        melanoma   Miscarriages / Stillbirths Mother    Asthma Brother    Arthritis Maternal Grandmother    Cancer Maternal Grandfather    Heart attack Maternal Grandfather    No Known Allergies Current Outpatient Medications  Medication Sig Dispense Refill   ascorbic acid (VITAMIN C) 500 MG tablet Take 500 mg by mouth daily.     aspirin  EC 325 MG tablet Take 1 tablet (325 mg total) by mouth daily. 14 tablet 0   Cholecalciferol (VITAMIN D3) 125 MCG (5000 UT) TABS Take 5,000 Units by mouth daily.     cyanocobalamin (VITAMIN B12) 1000 MCG tablet Take 1,000 mcg by mouth daily.     fexofenadine (ALLEGRA) 180 MG tablet Take 180 mg by mouth daily as needed for allergies.     oxyCODONE  (ROXICODONE ) 5 MG immediate release tablet Take 1 tablet (5 mg total) by mouth every 4 (four) hours as needed for severe pain (pain score 7-10) or breakthrough pain. 10 tablet 0   No current facility-administered medications for this visit.   No results found.  Review of Systems:   A ROS was performed including pertinent positives and negatives as documented in the HPI.   Musculoskeletal Exam:    There were no vitals taken for this visit.  Left shoulder with well-appearing incision.  In the standing position he can  forward elevate to approximately 50 degrees with external rotation at the side to 30 degrees.  Internal rotation deferred today.  Distal neurosensory exam is intact  Imaging:      I personally reviewed and interpreted the radiographs.   Assessment:   Status post left shoulder subscapularis repair open overall doing very well.  I will plan to see him back as needed Plan :    - Return to clinic as needed      I personally saw and evaluated the  patient, and participated in the management and treatment plan.  Elspeth Parker, MD Attending Physician, Orthopedic Surgery  This document was dictated using Dragon voice recognition software. A reasonable attempt at proof reading has been made to minimize errors.
